# Patient Record
Sex: Female | Born: 1945 | Race: White | Hispanic: No
Health system: Midwestern US, Community
[De-identification: ages and names within clinical notes are randomized; demographics above are authoritative.]

## PROBLEM LIST (undated history)

## (undated) DIAGNOSIS — K6289 Other specified diseases of anus and rectum: Secondary | ICD-10-CM

## (undated) DIAGNOSIS — R933 Abnormal findings on diagnostic imaging of other parts of digestive tract: Secondary | ICD-10-CM

## (undated) DIAGNOSIS — R918 Other nonspecific abnormal finding of lung field: Secondary | ICD-10-CM

## (undated) DIAGNOSIS — F411 Generalized anxiety disorder: Secondary | ICD-10-CM

## (undated) DIAGNOSIS — R945 Abnormal results of liver function studies: Secondary | ICD-10-CM

## (undated) DIAGNOSIS — M542 Cervicalgia: Secondary | ICD-10-CM

## (undated) DIAGNOSIS — C3492 Malignant neoplasm of unspecified part of left bronchus or lung: Secondary | ICD-10-CM

## (undated) DIAGNOSIS — M25551 Pain in right hip: Secondary | ICD-10-CM

## (undated) DIAGNOSIS — D120 Benign neoplasm of cecum: Secondary | ICD-10-CM

## (undated) DIAGNOSIS — J984 Other disorders of lung: Secondary | ICD-10-CM

## (undated) DIAGNOSIS — R042 Hemoptysis: Secondary | ICD-10-CM

## (undated) DIAGNOSIS — U071 COVID-19: Secondary | ICD-10-CM

## (undated) DIAGNOSIS — Z780 Asymptomatic menopausal state: Secondary | ICD-10-CM

## (undated) DIAGNOSIS — C3412 Malignant neoplasm of upper lobe, left bronchus or lung: Secondary | ICD-10-CM

---

## 2014-06-20 NOTE — Progress Notes (Signed)
Chief complaint:  Left upper lobe lung mass.    History:  The patient is a 68 year old woman who was a former cigarette smoker.  She has about a 34-pack-year history of smoking cigarettes, having quit 15 years ago.  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  This was followed with CT scans in [place] over the next year.   She had 10, 12, 13 and 14.  She had a fall where she broke seven ribs in her left chest and the CT scan for the fall at a new facility showed her to have this cavitary lung mass.  She was referred here for evaluation.  She has been kind of around the block trying to figure out what to do about this and she was referred here for our evaluation.      Past medical history:  Significant for hypertension for which she takes lisinopril.  She has had rotator cuff surgery.  She has had cholecystectomy.  She has about an ounce of alcohol a day with a cocktail.  She lives in [place], [place], having retired from the [employer] with her husband.  They are both from [place], near the [place] area.    Review of systems:  Complete and is otherwise unremarkable.    Family history:  Significant for a brother who has osteoarthritis and heart disease and a mother who had hypertension and a stroke and her father had a stroke.      Physical examination:  The patient is a well-developed, well-nourished woman in no acute distress.  HEENT EXAMINATION:  Trachea is in the midline.  No supraclavicular adenopathy, jugular distention or carotid bruit.  LUNGS:  Clear to percussion and auscultation.  HEART:  Regular sinus rhythm without murmurs or extra sounds.    ABDOMEN:  Soft and nontender without mass or organomegaly.  EXTREMITIES:  Show no clubbing, cyanosis or edema.       X-rays:  X-rays from 2011, 2012 and CAT scans from 11, 12, 14 and 15 are all reviewed.  This lesion is cavitary, solitary and it does not appear to have changed in the 4 years it has been followed yearly.  PET scan shows activity in the fractured ribs but does not show an SUV of 3 in this lesion.      We had a discussion about the fact that cancer is a primary concern and she and her husband are very educated and are clear about bronchial alveolar cell carcinoma versus adenocarcinoma in-situ versus solitary cavitary lesion.  I told them that I would present her to the tumor board but my inclination was that this be followed on a yearly basis and not be resected   unless it changed.  I am committed to calling them back as soon as the tumor board meets.

## 2014-06-20 NOTE — Progress Notes (Signed)
Patient says in the beginning of June she fell and broke her ribs and had test done and they said it was something on her left lung. She had a PET scan done and her PCP and she was seen [facility]) and her PCP just told her to come to [place]. She said she has been seeing Drs since 2011 in [place] for this problem and they all told her that she was fine and its nothing to worry about. She denies chest pains, coughing, wheezing, sob, weight loss, fevers or chills.

## 2014-08-09 NOTE — Progress Notes (Signed)
New CT shows further consolidaton of the lesion  Tumor board has been consulted and the recommendation is for resection  I have communicated this to Ms Andrea [name]   We will schedule surgery for later this month

## 2014-08-10 NOTE — Other (Signed)
[facility]  PREOPERATIVE INSTRUCTIONS    Surgery Date:   08/16/2014  Surgery arrival time given by surgeon: NO   If ???no???,[facility] staff will call you between 4 PM- 8 PM the day before surgery with your arrival time. If your surgery is on a Monday, we will call you the preceding Friday.       Please call [phone] after 8 PM if you did not receive your arrival time.    1. Please report at the designated time to the [facility].  Bring your insurance card, photo identification, and any copayment ( if applicable).  2. You must have a responsible adult to drive you home. You need to have a responsible adult to stay with you the first 24 hours after surgery if you are going home the same day of your surgery and you should not drive a car for 24 hours following your surgery.  3. Nothing to eat or drink after midnight the night before surgery. This includes no water, gum, mints, coffee, juice, etc.  Please note special instructions, if applicable, below for medications.  4. MEDICATIONS TO TAKE THE MORNING OF SURGERY WITH A SIP OF WATER: none  5. No alcoholic beverages 24 hours before or after your surgery.  6. If you are being admitted to the hospital,please leave personal belongings/luggage in your car until you have an assigned hospital room number.  7. Stop Aspirin and/or any non-steroidal anti-inflammatory drugs (i.e. Ibuprofen, Naproxen, Advil, Aleve) as directed by your surgeon.  You may take Tylenol.        Stop herbal supplements 1 week prior to  surgery.  8. If you are currently taking Plavix, Coumadin,or any other blood-thinning/anticoagulant medication contact your surgeon for instructions.  9. Please wear comfortable clothes. Wear your glasses instead of contacts. We ask that all money, jewelry and valuables be left at home. Wear no make up, particularly mascara, the day of surgery.   10.  All body piercings, rings,and jewelry need to be removed and left at  home.    Please wear your hair loose or down. Please no pony-tails, buns, or any metal hair accessories. If you shower the morning of surgery, please do not apply any lotions, powders, or deodorants afterwards.   Do not shave any body area within 24 hours of your surgery.  11. Please follow all instructions to avoid any potential surgical cancellation.  12.  Should your physical condition change, (i.e. fever, cold, flu, etc.) please notify your surgeon as soon as possible.  13. It is important to be on time. If a situation occurs where you may be delayed, please call:  [phone] / [phone] on the day of surgery.  14. The Preadmission Testing staff can be reached at [phone]..  15. Special instructions: none    The patient was contacted  via phone.   She  verbalize  understanding of all instructions does not  need reinforcement.

## 2014-08-11 NOTE — Progress Notes (Signed)
Patient scheduled to have surgery on Tuesday, Aug 16, 2014 at [facility]. Auth number [id number] choice [phone]

## 2014-08-18 NOTE — Telephone Encounter (Signed)
Called patient to reschedule surgery that was scheduled for 08/17/2014. Patient called because she wasn't feeling well. Called patient this morning and she says she will have to look at her schedule and get back to me with a date but says she is thinking that it wont be until January when she will be able to do the surgery.

## 2015-09-11 NOTE — Telephone Encounter (Signed)
Pt called about her medical treament plan which was discussed with Dr. Ria Comment over a year ago.  She now wants her imaging on a disk.  I informed her we only have one disk and that she needed to contact the other locations in which she had her scans for a copy.  The disk we have cannot be mailed due to HIPAA regulations.  I did inform her that she could come and pick the disk up; she felt that I was "punishing her for walking the disk into our office."  I let her know that this was not the case and that I had to follow the regulations.  She ended her conversation with asking me to email the number to [facility] radiology so she could contact them.  I provided the information from the front of the disk in an email (detailed below) sent to [email or web address]    Andrea [name],    From the disk we have the images were done at [facility] at [address].  Their number is [phone].    If you need any other assistance please give our office a call.    Thanks,

## 2017-06-27 ENCOUNTER — Encounter

## 2017-06-27 ENCOUNTER — Inpatient Hospital Stay: Admit: 2017-06-27 | Payer: MEDICARE

## 2017-06-27 DIAGNOSIS — M25512 Pain in left shoulder: Secondary | ICD-10-CM

## 2018-01-16 ENCOUNTER — Encounter

## 2018-01-22 ENCOUNTER — Inpatient Hospital Stay: Admit: 2018-01-22 | Payer: MEDICARE | Attending: Internal Medicine

## 2018-01-22 DIAGNOSIS — Z13820 Encounter for screening for osteoporosis: Secondary | ICD-10-CM

## 2018-03-02 ENCOUNTER — Inpatient Hospital Stay
Admit: 2018-03-02 | Discharge: 2018-03-02 | Disposition: A | Discharge status: Home / Self Care | Payer: MEDICARE | Attending: Emergency Medicine

## 2018-03-02 ENCOUNTER — Emergency Department: Admit: 2018-03-02 | Payer: MEDICARE

## 2018-03-02 DIAGNOSIS — R509 Fever, unspecified: Secondary | ICD-10-CM

## 2018-03-02 LAB — COMPREHENSIVE METABOLIC PANEL
ALT: 112 U/L — ABNORMAL HIGH (ref 12–78)
AST: 157 U/L — ABNORMAL HIGH (ref 15–37)
Albumin/Globulin Ratio: 1 — ABNORMAL LOW (ref 1.1–2.2)
Albumin: 3 g/dL — ABNORMAL LOW (ref 3.5–5.0)
Alkaline Phosphatase: 141 U/L — ABNORMAL HIGH (ref 45–117)
Anion Gap: 10 mmol/L (ref 5–15)
BUN: 12 MG/DL (ref 6–20)
Bun/Cre Ratio: 19 (ref 12–20)
CO2: 25 mmol/L (ref 21–32)
Calcium: 8 MG/DL — ABNORMAL LOW (ref 8.5–10.1)
Chloride: 102 mmol/L (ref 97–108)
Creatinine: 0.64 MG/DL (ref 0.55–1.02)
EGFR IF NonAfrican American: >60 mL/min/{1.73_m2} (ref >60)
GFR African American: >60 mL/min/{1.73_m2} (ref >60)
Globulin: 3 g/dL (ref 2.0–4.0)
Glucose: 101 mg/dL — ABNORMAL HIGH (ref 65–100)
Potassium: 3.1 mmol/L — ABNORMAL LOW (ref 3.5–5.1)
Sodium: 137 mmol/L (ref 136–145)
Total Bilirubin: 0.7 MG/DL (ref 0.2–1.0)
Total Protein: 6 g/dL — ABNORMAL LOW (ref 6.4–8.2)

## 2018-03-02 LAB — URINALYSIS W/ RFLX MICROSCOPIC
Bilirubin, Urine: NEGATIVE
Bilirubin: NEGATIVE
Blood, Urine: NEGATIVE
Blood: NEGATIVE
Glucose, Ur: NEGATIVE mg/dL
Glucose: NEGATIVE mg/dL
Ketone: 80 mg/dL — AB
Ketones, Urine: 80 mg/dL — AB
Leukocyte Esterase, Urine: NEGATIVE
Leukocyte Esterase: NEGATIVE
Nitrite, Urine: NEGATIVE
Nitrites: NEGATIVE
Specific Gravity, UA: 1.02 (ref 1.003–1.030)
Specific gravity: 1.02 (ref 1.003–1.030)
Urobilinogen, UA, POCT: 0.2 EU/dL (ref 0.2–1.0)
Urobilinogen: 0.2 EU/dL (ref 0.2–1.0)
pH (UA): 5.5 (ref 5.0–8.0)
pH, UA: 5.5 (ref 5.0–8.0)

## 2018-03-02 LAB — CBC WITH AUTO DIFFERENTIAL
Basophils %: 0 % (ref 0–1)
Basophils Absolute: 0 10*3/uL (ref 0.0–0.1)
Eosinophils %: 0 % (ref 0–7)
Eosinophils Absolute: 0 10*3/uL (ref 0.0–0.4)
Granulocyte Absolute Count: 0 10*3/uL (ref 0.00–0.04)
Hematocrit: 34.4 % — ABNORMAL LOW (ref 35.0–47.0)
Hemoglobin: 11.8 g/dL (ref 11.5–16.0)
Immature Granulocytes: 0 % (ref 0.0–0.5)
Lymphocytes %: 1 % — ABNORMAL LOW (ref 12–49)
Lymphocytes Absolute: 0 10*3/uL — ABNORMAL LOW (ref 0.8–3.5)
MCH: 33.1 PG (ref 26.0–34.0)
MCHC: 34.3 g/dL (ref 30.0–36.5)
MCV: 96.4 FL (ref 80.0–99.0)
MPV: 10.5 FL (ref 8.9–12.9)
Monocytes %: 4 % — ABNORMAL LOW (ref 5–13)
Monocytes Absolute: 0.2 10*3/uL (ref 0.0–1.0)
NRBC Absolute: 0 10*3/uL (ref 0.00–0.01)
Neutrophils %: 95 % — ABNORMAL HIGH (ref 32–75)
Neutrophils Absolute: 4.3 10*3/uL (ref 1.8–8.0)
Nucleated RBCs: 0 PER 100 WBC
Platelets: 127 10*3/uL — ABNORMAL LOW (ref 150–400)
RBC: 3.57 M/uL — ABNORMAL LOW (ref 3.80–5.20)
RDW: 11.9 % (ref 11.5–14.5)
WBC: 4.5 10*3/uL (ref 3.6–11.0)

## 2018-03-02 LAB — URINE MICROSCOPIC ONLY
BACTERIA, URINE: NEGATIVE /hpf
Bacteria: NEGATIVE /hpf

## 2018-03-02 LAB — LACTIC ACID
Lactic Acid: 1.1 MMOL/L (ref 0.4–2.0)
Lactic acid: 1.1 MMOL/L (ref 0.4–2.0)

## 2018-03-02 LAB — TROPONIN: Troponin I: <0.05 ng/mL (ref <0.05)

## 2018-03-02 LAB — CBC WITH AUTOMATED DIFF
ABS. BASOPHILS: 0 10*3/uL (ref 0.0–0.1)
ABS. EOSINOPHILS: 0 10*3/uL (ref 0.0–0.4)
ABS. IMM. GRANS.: 0 10*3/uL (ref 0.00–0.04)
ABS. LYMPHOCYTES: 0 10*3/uL — ABNORMAL LOW (ref 0.8–3.5)
ABS. MONOCYTES: 0.2 10*3/uL (ref 0.0–1.0)
ABS. NEUTROPHILS: 4.3 10*3/uL (ref 1.8–8.0)
ABSOLUTE NRBC: 0 10*3/uL (ref 0.00–0.01)
BASOPHILS: 0 % (ref 0–1)
EOSINOPHILS: 0 % (ref 0–7)
HCT: 34.4 % — ABNORMAL LOW (ref 35.0–47.0)
HGB: 11.8 g/dL (ref 11.5–16.0)
IMMATURE GRANULOCYTES: 0 % (ref 0.0–0.5)
LYMPHOCYTES: 1 % — ABNORMAL LOW (ref 12–49)
MCH: 33.1 PG (ref 26.0–34.0)
MCHC: 34.3 g/dL (ref 30.0–36.5)
MCV: 96.4 FL (ref 80.0–99.0)
MONOCYTES: 4 % — ABNORMAL LOW (ref 5–13)
MPV: 10.5 FL (ref 8.9–12.9)
NEUTROPHILS: 95 % — ABNORMAL HIGH (ref 32–75)
NRBC: 0 PER 100 WBC
PLATELET: 127 10*3/uL — ABNORMAL LOW (ref 150–400)
RBC: 3.57 M/uL — ABNORMAL LOW (ref 3.80–5.20)
RDW: 11.9 % (ref 11.5–14.5)
WBC: 4.5 10*3/uL (ref 3.6–11.0)

## 2018-03-02 LAB — METABOLIC PANEL, COMPREHENSIVE
A-G Ratio: 1 — ABNORMAL LOW (ref 1.1–2.2)
ALT (SGPT): 112 U/L — ABNORMAL HIGH (ref 12–78)
AST (SGOT): 157 U/L — ABNORMAL HIGH (ref 15–37)
Albumin: 3 g/dL — ABNORMAL LOW (ref 3.5–5.0)
Alk. phosphatase: 141 U/L — ABNORMAL HIGH (ref 45–117)
Anion gap: 10 mmol/L (ref 5–15)
BUN/Creatinine ratio: 19 (ref 12–20)
BUN: 12 MG/DL (ref 6–20)
Bilirubin, total: 0.7 MG/DL (ref 0.2–1.0)
CO2: 25 mmol/L (ref 21–32)
Calcium: 8 MG/DL — ABNORMAL LOW (ref 8.5–10.1)
Chloride: 102 mmol/L (ref 97–108)
Creatinine: 0.64 MG/DL (ref 0.55–1.02)
GFR est AA: >60 mL/min/{1.73_m2} (ref >60)
GFR est non-AA: >60 mL/min/{1.73_m2} (ref >60)
Globulin: 3 g/dL (ref 2.0–4.0)
Glucose: 101 mg/dL — ABNORMAL HIGH (ref 65–100)
Potassium: 3.1 mmol/L — ABNORMAL LOW (ref 3.5–5.1)
Protein, total: 6 g/dL — ABNORMAL LOW (ref 6.4–8.2)
Sodium: 137 mmol/L (ref 136–145)

## 2018-03-02 LAB — URINE CULTURE HOLD SAMPLE

## 2018-03-02 LAB — SAMPLES BEING HELD

## 2018-03-02 LAB — TROPONIN I: Troponin-I, Qt.: <0.05 ng/mL (ref <0.05)

## 2018-03-02 MED ORDER — LACTATED RINGERS BOLUS IV
Freq: Once | INTRAVENOUS | Status: AC
Start: 2018-03-02 — End: 2018-03-02
  Administered 2018-03-02: 22:00:00 via INTRAVENOUS

## 2018-03-02 MED ORDER — SODIUM CHLORIDE 0.9 % IJ SYRG
INTRAMUSCULAR | Status: DC | PRN
Start: 2018-03-02 — End: 2018-03-02

## 2018-03-02 MED ORDER — ONDANSETRON (PF) 4 MG/2 ML INJECTION
4 mg/2 mL | Freq: Once | INTRAMUSCULAR | Status: AC
Start: 2018-03-02 — End: 2018-03-02
  Administered 2018-03-02: 21:00:00 via INTRAVENOUS

## 2018-03-02 MED ORDER — LACTATED RINGERS BOLUS IV
Freq: Once | INTRAVENOUS | Status: AC
Start: 2018-03-02 — End: 2018-03-02
  Administered 2018-03-02: 20:00:00 via INTRAVENOUS

## 2018-03-02 MED ORDER — KETOROLAC TROMETHAMINE 30 MG/ML INJECTION
30 mg/mL (1 mL) | INTRAMUSCULAR | Status: AC
Start: 2018-03-02 — End: 2018-03-02
  Administered 2018-03-02: 21:00:00 via INTRAVENOUS

## 2018-03-02 MED ORDER — ACETAMINOPHEN 325 MG TABLET
325 mg | ORAL | Status: AC
Start: 2018-03-02 — End: 2018-03-02
  Administered 2018-03-02: 21:00:00 via ORAL

## 2018-03-02 MED ORDER — POTASSIUM CHLORIDE SR 10 MEQ TAB
10 mEq | ORAL | Status: AC
Start: 2018-03-02 — End: 2018-03-02
  Administered 2018-03-02: 23:00:00 via ORAL

## 2018-03-02 MED FILL — LACTATED RINGERS IV: INTRAVENOUS | Qty: 1000

## 2018-03-02 MED FILL — ONDANSETRON (PF) 4 MG/2 ML INJECTION: 4 mg/2 mL | INTRAMUSCULAR | Qty: 2

## 2018-03-02 MED FILL — KETOROLAC TROMETHAMINE 30 MG/ML INJECTION: 30 mg/mL (1 mL) | INTRAMUSCULAR | Qty: 1

## 2018-03-02 MED FILL — ACETAMINOPHEN 325 MG TABLET: 325 mg | ORAL | Qty: 3

## 2018-03-02 MED FILL — POTASSIUM CHLORIDE SR 10 MEQ TAB: 10 mEq | ORAL | Qty: 4

## 2018-03-02 MED FILL — NORMAL SALINE FLUSH 0.9 % INJECTION SYRINGE: INTRAMUSCULAR | Qty: 10

## 2018-03-02 NOTE — ED Notes (Signed)
Bedside shift change report given to Andrea [name] (oncoming nurse) by Merry Lofty, RN (offgoing nurse). Report included the following information SBAR, ED Summary, Recent Results and Cardiac Rhythm NSR inverted T waves.

## 2018-03-02 NOTE — ED Provider Notes (Signed)
EMERGENCY DEPARTMENT HISTORY AND PHYSICAL EXAM    3:39 PM      Date: 03/02/2018  Patient Name: Andrea [name]    History of Presenting Illness     Chief Complaint   Patient presents with   ??? Fever         History Provided By: Patient      Additional History (Context): Andrea [name] is a 72 y.o. female with hypertension who presents with multiple complaints.  Patient is feeling poorly all over, "I feel like shit".  She is extremely cold, she has right back pain that extends down to behind her right knee, made worse with movement of her right leg, she feels extremely thirsty, she is very tired.  Of note, she does have some neck stiffness, however she has had this for quite some time, as documented on prior notes.  No one else with similar symptoms.  She has had nausea, no vomiting.  Denies shortness of breath, chest pain, belly pain, denies any urinary complaints    PCP: Daffeh, June B, MD    Current Outpatient Medications   Medication Sig Dispense Refill   ??? amoxicillin (AMOXIL) 500 mg capsule Take 500 mg by mouth three (3) times daily.     ??? hydrALAZINE (APRESOLINE) 50 mg tablet Take 50 mg by mouth two (2) times a day.     ??? escitalopram oxalate (LEXAPRO) 5 mg tablet Take 10 mg by mouth daily.     ??? METHYLCELLULOSE Take 500 mg by mouth nightly.     ??? atenolol (TENORMIN) 25 mg tablet Take 25 mg by mouth daily.     ??? NIACIN, INOSITOL NIACINATE, (NIACIN NO FLUSH PO) Take 1 Tab by mouth nightly.     ??? lisinopril (PRINIVIL, ZESTRIL) 5 mg tablet Take 40 mg by mouth daily.         Past History     Past Medical History:  Past Medical History:   Diagnosis Date   ??? Essential hypertension    ??? Gallstone    ??? GERD (gastroesophageal reflux disease)        Past Surgical History:  Past Surgical History:   Procedure Laterality Date   ??? HX CHOLECYSTECTOMY  06/14/2014   ??? HX MOHS PROCEDURES  2011   ??? HX TONSILLECTOMY  1962       Family History:  Family History   Problem Relation Age of Onset   ??? Stroke Mother    ???  Hypertension Mother    ??? Stroke Father    ??? Arthritis-osteo Brother    ??? Cancer Brother    ??? Heart Disease Brother    ??? Kidney Disease Brother        Social History:  Social History     Tobacco Use   ??? Smoking status: Former Smoker     Packs/day: 1.00     Years: 34.00     Pack years: 34.00   Substance Use Topics   ??? Alcohol use: Yes     Alcohol/week: 4.2 oz     Types: 7 Shots of liquor per week   ??? Drug use: Not on file       Allergies:  Allergies   Allergen Reactions   ??? Norco [Hydrocodone-Acetaminophen] Other (comments)     "loopy feeling"   ??? Percocet [Oxycodone-Acetaminophen] Other (comments)     "feeling loopy"         Review of Systems       Review of Systems  Constitutional: Positive for chills and fatigue. Negative for activity change and fever.   HENT: Negative.  Negative for ear pain, hearing loss and sore throat.    Eyes: Negative.  Negative for pain and visual disturbance.   Respiratory: Negative.  Negative for cough, chest tightness and shortness of breath.    Cardiovascular: Negative.  Negative for chest pain and leg swelling.   Gastrointestinal: Negative.  Negative for abdominal distention and abdominal pain.   Genitourinary: Negative.  Negative for dysuria and hematuria.   Musculoskeletal: Positive for back pain.   Skin: Negative.  Negative for rash.   Neurological: Negative.  Negative for dizziness and headaches.   Psychiatric/Behavioral: Negative.  Negative for agitation and behavioral problems.         Physical Exam     Visit Vitals  BP (!) 98/35   Pulse 63   Temp 99.1 ??F (37.3 ??C)   Resp 21   Ht 5\' 2"  (1.575 m)   Wt 74.8 kg (165 lb)   SpO2 93%   BMI 30.18 kg/m??         Physical Exam   Constitutional: She is oriented to person, place, and time. She appears well-developed and well-nourished.   HENT:   Head: Normocephalic and atraumatic.   Very dry mucous membranes   Eyes: Pupils are equal, round, and reactive to light. EOM are normal. Right eye exhibits no discharge. Left eye exhibits no  discharge.   Neck: Normal range of motion. Neck supple. No JVD present. No tracheal deviation present.   Cardiovascular: Normal rate, regular rhythm and normal heart sounds.   No murmur heard.  Pulmonary/Chest: Effort normal and breath sounds normal. No respiratory distress. She has no wheezes. She has no rales.   Abdominal: Soft. Bowel sounds are normal. She exhibits no distension. There is no tenderness. There is no rebound.   Musculoskeletal: Normal range of motion. She exhibits no tenderness or deformity.   Positive straight leg raise test on the right, tenderness to palpation along the right paraspinal, able to exacerbate symptoms by pushing on the piriformis outlet   Neurological: She is alert and oriented to person, place, and time. No cranial nerve deficit.   5/5 strength UE/LE, 5/5 sensation UE/LE  Negative Kernig, negative Brudzinski   Skin: Skin is warm and dry. No rash noted. No erythema.   Psychiatric: She has a normal mood and affect. Her behavior is normal.         Diagnostic Study Results     Labs -  No results found for this or any previous visit (from the past 12 hour(s)).    Radiologic Studies -   XR CHEST SNGL V   Final Result   IMPRESSION: No acute cardiopulmonary disease.               Medical Decision Making   I am the first provider for this patient.    I reviewed the vital signs, available nursing notes, past medical history, past surgical history, family history and social history.    Vital Signs-Reviewed the patient's vital signs.      UVO:[id number] Sinus Rhythm   Interpreted by the EP.   Time Interpreted: 0347   Rate: 85   Rhythm: NSR w/pac   Interpretation:RBBB no ischemia    Records Reviewed: Nursing Notes and Old Medical Records      Provider Notes (Medical Decision Making):     MDM  Number of Diagnoses or Management Options  Fever, unspecified fever cause:  Diagnosis management comments: Differential Diagnosis: Viral syndrome, pneumonia, UTI, meningitis    Patient presenting with fever,  family sent her because she was not responding at the phone but this is because she did not have service she states, she denies chest pain shortness of breath abdominal pain, she has his back pain but she has a positive straight leg raise test on the right, it is paraspinal, not midline, no concern for discitis or spinal epidural abscess. No concern for meningitis at this time, she is lucid, no meningismus, she has prior neck pain.  Will check basic labs for any leukocytosis or other concerning findings, rehydrate, reassess after treating nausea and fever    Reevaluation: workup negative, pt feeling better, likely viral syndrome, no leukocytosis, left shift, counseled on return precautions such as intolerancer of PO. BP baseline.safe for d/c    Safe for d/c, careful return precautions given. Pt/family comfortable with plan    Winifred Olive, MD            Diagnosis     Clinical Impression:   1. Fever, unspecified fever cause        Disposition: home    Follow-up Information     Follow up With Specialties Details Why Contact Info    [facility]  As needed, If symptoms worsen [facility]    Daffeh, June B, MD Internal Medicine In 1 day  [address]  [place] [address]  [phone]             Discharge Medication List as of 03/02/2018  7:08 PM      CONTINUE these medications which have NOT CHANGED    Details   escitalopram oxalate (LEXAPRO) 5 mg tablet Take 5 mg by mouth nightly., Historical Med      METHYLCELLULOSE Take 500 mg by mouth nightly., Historical Med      atenolol (TENORMIN) 25 mg tablet Take 25 mg by mouth nightly., Historical Med      NIACIN, INOSITOL NIACINATE, (NIACIN NO FLUSH PO) Take 1 Tab by mouth nightly., Historical Med      lisinopril (PRINIVIL, ZESTRIL) 5 mg tablet Take  by mouth nightly., Historical Med      hydrochlorothiazide (HYDRODIURIL) 25 mg tablet Take 25 mg by mouth nightly., Historical Med            _______________________________    Please note that this dictation was completed with Dragon, the computer voice recognition software.  Quite often unanticipated grammatical, syntax, homophones, and other interpretive errors are inadvertently transcribed by the computer software.  Please disregard these errors.  Please excuse any errors that have escaped final proofreading.

## 2018-03-02 NOTE — ED Notes (Signed)
Bp's have been running low. Provider aware. Daughter stated she was started on a new BP medication. Patient comfortable and sleeping.

## 2018-03-02 NOTE — ED Notes (Signed)
Discharge instructions reviewed w/pt and her dtr... Pt verbalized understanding, all questions answered..the patient ambulated off the ED unit w/o difficulty.

## 2018-03-02 NOTE — ED Notes (Signed)
Patient instructed on how to obtain a Clean Catch [place] urine. Verbalized understanding.

## 2018-03-02 NOTE — ED Notes (Signed)
Patient feeling much better, talkative and thirsty.

## 2018-03-02 NOTE — ED Notes (Addendum)
Bedside shift change report given to Andrea [name] (oncoming nurse) by Laverda Sorenson, RN (offgoing nurse). Report included the following information SBAR, ED Summary, Recent Results and Cardiac Rhythm NSR inverted T waves.

## 2018-03-02 NOTE — ED Notes (Signed)
Patient instructed on how to obtain a Clean Catch [place] urine. Verbalized understanding.

## 2018-03-02 NOTE — ED Provider Notes (Signed)
EMERGENCY DEPARTMENT HISTORY AND PHYSICAL EXAM    3:39 PM      Date: 03/02/2018  Patient Name: Andrea [name]    History of Presenting Illness     Chief Complaint   Patient presents with   ??? Fever         History Provided By: Patient      Additional History (Context): Andrea [name] is a 72 y.o. female with hypertension who presents with multiple complaints.  Patient is feeling poorly all over, "I feel like shit".  She is extremely cold, she has right back pain that extends down to behind her right knee, made worse with movement of her right leg, she feels extremely thirsty, she is very tired.  Of note, she does have some neck stiffness, however she has had this for quite some time, as documented on prior notes.  No one else with similar symptoms.  She has had nausea, no vomiting.  Denies shortness of breath, chest pain, belly pain, denies any urinary complaints    PCP: Daffeh, June B, MD    Current Outpatient Medications   Medication Sig Dispense Refill   ??? amoxicillin (AMOXIL) 500 mg capsule Take 500 mg by mouth three (3) times daily.     ??? hydrALAZINE (APRESOLINE) 50 mg tablet Take 50 mg by mouth two (2) times a day.     ??? escitalopram oxalate (LEXAPRO) 5 mg tablet Take 10 mg by mouth daily.     ??? METHYLCELLULOSE Take 500 mg by mouth nightly.     ??? atenolol (TENORMIN) 25 mg tablet Take 25 mg by mouth daily.     ??? NIACIN, INOSITOL NIACINATE, (NIACIN NO FLUSH PO) Take 1 Tab by mouth nightly.     ??? lisinopril (PRINIVIL, ZESTRIL) 5 mg tablet Take 40 mg by mouth daily.         Past History     Past Medical History:  Past Medical History:   Diagnosis Date   ??? Essential hypertension    ??? Gallstone    ??? GERD (gastroesophageal reflux disease)        Past Surgical History:  Past Surgical History:   Procedure Laterality Date   ??? HX CHOLECYSTECTOMY  06/14/2014   ??? HX MOHS PROCEDURES  2011   ??? HX TONSILLECTOMY  1962       Family History:  Family History   Problem Relation Age of Onset   ??? Stroke Mother     ??? Hypertension Mother    ??? Stroke Father    ??? Arthritis-osteo Brother    ??? Cancer Brother    ??? Heart Disease Brother    ??? Kidney Disease Brother        Social History:  Social History     Tobacco Use   ??? Smoking status: Former Smoker     Packs/day: 1.00     Years: 34.00     Pack years: 34.00   Substance Use Topics   ??? Alcohol use: Yes     Alcohol/week: 4.2 oz     Types: 7 Shots of liquor per week   ??? Drug use: Not on file       Allergies:  Allergies   Allergen Reactions   ??? Norco [Hydrocodone-Acetaminophen] Other (comments)     "loopy feeling"   ??? Percocet [Oxycodone-Acetaminophen] Other (comments)     "feeling loopy"         Review of Systems       Review of Systems  Constitutional: Positive for chills and fatigue. Negative for activity change and fever.   HENT: Negative.  Negative for ear pain, hearing loss and sore throat.    Eyes: Negative.  Negative for pain and visual disturbance.   Respiratory: Negative.  Negative for cough, chest tightness and shortness of breath.    Cardiovascular: Negative.  Negative for chest pain and leg swelling.   Gastrointestinal: Negative.  Negative for abdominal distention and abdominal pain.   Genitourinary: Negative.  Negative for dysuria and hematuria.   Musculoskeletal: Positive for back pain.   Skin: Negative.  Negative for rash.   Neurological: Negative.  Negative for dizziness and headaches.   Psychiatric/Behavioral: Negative.  Negative for agitation and behavioral problems.         Physical Exam     Visit Vitals  BP (!) 98/35   Pulse 63   Temp 99.1 ??F (37.3 ??C)   Resp 21   Ht 5\' 2"  (1.575 m)   Wt 74.8 kg (165 lb)   SpO2 93%   BMI 30.18 kg/m??         Physical Exam   Constitutional: She is oriented to person, place, and time. She appears well-developed and well-nourished.   HENT:   Head: Normocephalic and atraumatic.   Very dry mucous membranes   Eyes: Pupils are equal, round, and reactive to light. EOM are normal.  Right eye exhibits no discharge. Left eye exhibits no discharge.   Neck: Normal range of motion. Neck supple. No JVD present. No tracheal deviation present.   Cardiovascular: Normal rate, regular rhythm and normal heart sounds.   No murmur heard.  Pulmonary/Chest: Effort normal and breath sounds normal. No respiratory distress. She has no wheezes. She has no rales.   Abdominal: Soft. Bowel sounds are normal. She exhibits no distension. There is no tenderness. There is no rebound.   Musculoskeletal: Normal range of motion. She exhibits no tenderness or deformity.   Positive straight leg raise test on the right, tenderness to palpation along the right paraspinal, able to exacerbate symptoms by pushing on the piriformis outlet   Neurological: She is alert and oriented to person, place, and time. No cranial nerve deficit.   5/5 strength UE/LE, 5/5 sensation UE/LE  Negative Kernig, negative Brudzinski   Skin: Skin is warm and dry. No rash noted. No erythema.   Psychiatric: She has a normal mood and affect. Her behavior is normal.         Diagnostic Study Results     Labs -  No results found for this or any previous visit (from the past 12 hour(s)).    Radiologic Studies -   XR CHEST SNGL V   Final Result   IMPRESSION: No acute cardiopulmonary disease.               Medical Decision Making   I am the first provider for this patient.    I reviewed the vital signs, available nursing notes, past medical history, past surgical history, family history and social history.    Vital Signs-Reviewed the patient's vital signs.      ZOX:WRUEAV Sinus Rhythm   Interpreted by the EP.   Time Interpreted: 4098   Rate: 85   Rhythm: NSR w/pac   Interpretation:RBBB no ischemia    Records Reviewed: Nursing Notes and Old Medical Records      Provider Notes (Medical Decision Making):     MDM  Number of Diagnoses or Management Options  Fever, unspecified fever cause:  Diagnosis management comments: Differential Diagnosis: Viral syndrome,  pneumonia, UTI, meningitis    Patient presenting with fever, family sent her because she was not responding at the phone but this is because she did not have service she states, she denies chest pain shortness of breath abdominal pain, she has his back pain but she has a positive straight leg raise test on the right, it is paraspinal, not midline, no concern for discitis or spinal epidural abscess. No concern for meningitis at this time, she is lucid, no meningismus, she has prior neck pain.  Will check basic labs for any leukocytosis or other concerning findings, rehydrate, reassess after treating nausea and fever    Reevaluation: workup negative, pt feeling better, likely viral syndrome, no leukocytosis, left shift, counseled on return precautions such as intolerancer of PO. BP baseline.safe for d/c    Safe for d/c, careful return precautions given. Pt/family comfortable with plan    Winifred Olive, MD            Diagnosis     Clinical Impression:   1. Fever, unspecified fever cause      Disposition: home    Follow-up Information     Follow up With Specialties Details Why Contact Info    [facility]  As needed, If symptoms worsen [facility]    Daffeh, June B, MD Internal Medicine In 1 day  [address]  [place] [address]  [phone]             Discharge Medication List as of 03/02/2018  7:08 PM      CONTINUE these medications which have NOT CHANGED    Details   escitalopram oxalate (LEXAPRO) 5 mg tablet Take 5 mg by mouth nightly., Historical Med      METHYLCELLULOSE Take 500 mg by mouth nightly., Historical Med      atenolol (TENORMIN) 25 mg tablet Take 25 mg by mouth nightly., Historical Med      NIACIN, INOSITOL NIACINATE, (NIACIN NO FLUSH PO) Take 1 Tab by mouth nightly., Historical Med      lisinopril (PRINIVIL, ZESTRIL) 5 mg tablet Take  by mouth nightly., Historical Med      hydrochlorothiazide (HYDRODIURIL) 25 mg tablet Take 25 mg by mouth  nightly., Historical Med           _______________________________    Please note that this dictation was completed with Dragon, the computer voice recognition software.  Quite often unanticipated grammatical, syntax, homophones, and other interpretive errors are inadvertently transcribed by the computer software.  Please disregard these errors.  Please excuse any errors that have escaped final proofreading.

## 2018-03-03 LAB — EKG 12-LEAD
Atrial Rate: 85 {beats}/min
P Axis: 70 degrees
P-R Interval: 150 ms
Q-T Interval: 396 ms
QRS Duration: 126 ms
QTc Calculation (Bazett): 471 ms
R Axis: 22 degrees
T Axis: 36 degrees
Ventricular Rate: 85 {beats}/min

## 2018-03-03 LAB — EKG, 12 LEAD, INITIAL
Atrial Rate: 85 {beats}/min
Calculated P Axis: 70 degrees
Calculated R Axis: 22 degrees
Calculated T Axis: 36 degrees
P-R Interval: 150 ms
Q-T Interval: 396 ms
QRS Duration: 126 ms
QTC Calculation (Bezet): 471 ms
Ventricular Rate: 85 {beats}/min

## 2018-03-08 LAB — CULTURE, BLOOD 1
Culture: NO GROWTH
Culture: NO GROWTH

## 2018-03-08 LAB — CULTURE, BLOOD
Culture result:: NO GROWTH
Culture result:: NO GROWTH

## 2018-03-12 ENCOUNTER — Encounter

## 2018-03-13 ENCOUNTER — Inpatient Hospital Stay: Admit: 2018-03-13 | Payer: MEDICARE | Attending: Internal Medicine

## 2018-03-13 DIAGNOSIS — N200 Calculus of kidney: Secondary | ICD-10-CM

## 2018-04-27 ENCOUNTER — Inpatient Hospital Stay: Admit: 2018-04-27 | Payer: MEDICARE | Attending: Psychiatry

## 2018-04-27 DIAGNOSIS — F331 Major depressive disorder, recurrent, moderate: Secondary | ICD-10-CM

## 2018-04-27 MED ORDER — VENLAFAXINE SR 75 MG 24 HR CAP
75 mg | ORAL_CAPSULE | Freq: Every day | ORAL | 1 refills | Status: DC
Start: 2018-04-27 — End: 2018-05-29

## 2018-04-27 MED ORDER — LORAZEPAM 0.5 MG TAB
0.5 mg | ORAL_TABLET | Freq: Two times a day (BID) | ORAL | 2 refills | Status: DC | PRN
Start: 2018-04-27 — End: 2018-09-09

## 2018-04-27 NOTE — H&P (Signed)
[facility] EVALUATION    Name:  Andrea [name], Andrea [name]  MR#:  [id number]  DOB:  1946/09/30  ACCOUNT #:  [id number]  ADMIT DATE:  04/27/2018      [facility]    DATE OF EVALUATION:  04/27/2018    HISTORY OF PRESENT ILLNESS:  The patient is a 72 year old widowed white female who was referred by Dr. Brand Males concerning worsening symptoms of depression and anxiety, particularly since her husband passed away in 26-Jan-2023.  She indicates that she has been significantly anxious and likely depressed since her husband died, and then went into description of just how complex and complicated his help had been for a number of years leading up to his death, and how many things went wrong with his treatment.  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  That led to a lot of care taking on the part of the patient towards her husband, but then he started to develop peripheral vascular problems, which were never discovered until essentially it was too late.  He went to [facility] for surgery, but had multiple complications including lacerated bowels, breaking down the anastomoses of the large and small intestine, infections, etc.  His condition deteriorated, but not immediately, and the patient was spending essentially every moment of her day having to take care of him.  He had subsequently died suddenly from aspiration, and clearly this still is very distressing for her, although she believes as though she has dealt with most of the grief surrounding his death.    In looking at her history, however, it appears as though most of it has been spent taking care of her husband, as he was an alcoholic  before that, and much of her life centered around making sure he was healthy, that his needs were being met, that they were getting along well, etc.  His death has obviously left a huge hole in her life, which she is trying to fill, but the onset of some depression has kept that from happening.    Specifically, she has been very amotivated, increasingly withdrawn and isolative, and very anergic.  She is not sleeping well with significant middle insomnia, and although she states her appetite is adequate, she has lost weight at times.  She states her concentration has been impacted and she is more forgetful than normal, but she denies that she is having any crying spells anymore.  She has still been anhedonic and dysphoric, although she denies feeling fully helpless or hopeless.  She denies having any suicidal thoughts at all.    Anxiety wise, she finds that she worries constantly, and that she is often "spinning" psychologically, being very indecisive, unable to make choices, etc.  At times, she will have the sensation that she is choking and getting short of breath, but most of her anxiety is cognitive in nature.  She states that she needs to sell her house, although she has not contacted a real estate agent, and that she needs to move, yet she really does not know where she would go.  She has two children, one of whom is in [place] and one in [place], but she  does not think it would be best to move near them, so she really is in limbo in many many ways.    She states that she really does not have any substance abuse issues herself, although she does drink some alcohol, roughly six or seven shots per week.  She states it has never been problematic for her.  She denies any history or symptoms consistent with any mania, and denies any history of symptoms of psychosis or other psychiatric issues.    PAST PSYCHIATRIC HISTORY:  She has never been hospitalized and has not really seen any psychiatrists, but she has had  some therapy in the past, some of it being couple's therapy dealing with her husband's alcoholism.  She was put on Celexa or Lexapro many years ago when she went through menopause and was somewhat depressed, and it worked well for some time.  However, she was switched from Lexapro over to Effexor roughly a couple of months ago, but only taking 37.5 mg daily.  She did have some difficulty coming off the Lexapro due to withdrawal symptoms.    PAST MEDICAL HISTORY:  1.  Hypertension.  2.  Rotator cuff repair.  3.  GERD.    CURRENT MEDICATIONS:  1.  Effexor XR 37.5 mg daily.  2.  Lorazepam 0.5 mg p.r.n. - at this point she is taking only about one to two doses per week.  3.  Atenolol 25 mg daily.  4.  Hydralazine 50 mg b.i.d.  5.  Lisinopril 40 mg daily.    ALLERGIES:  PERCOCET AND NORCO MAKE HER "LOOPY."    FAMILY HISTORY:  Her daughter and son are both alcoholic.  She denies any other family psychiatric history.    SOCIAL HISTORY:  She has been widowed for a year and 3 months.  She has two children, son who lives in [place] and a daughter who lives in [place].  She has a Ph.D. in Microbiology, but stopped working many years ago as her husband was making money in [place], and she did not need to work.  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  She no longer smokes, having given that up years ago.    MENTAL STATUS EXAM:  The patient is noted to be a casually dressed, adequately groomed 72 year old who appeared slightly younger than her stated age.  She was pleasant, cooperative, attentive, and fairly verbal.  She became quite animated when discussing her husband's health related issues and all the frustrations dealing with doctors, poor outcomes, etc.  She did endorse having some depressive symptoms as noted above, including some mild to moderate neurovegetative dysfunction.   She denied having any manic or hypomanic symptoms, although her speech was a bit pressured at moments mostly due to her anger and anxiety.  There was no evidence of any psychosis such as hallucinations or delusional thinking and her thoughts were fairly organized and coherent.  Her judgment and insight appear to be reasonably good.  Cognitively, her fund of knowledge is above average.  She did have some slight deficits in concentration and recall/short-term memory likely due to poor concentration.    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  AXIS II:  Deferred.  AXIS III:  Hypertension; gastroesophageal reflux disease; rotator cuff repair.    PLAN:  1.  At this point, we will continue the Effexor XR, but increase the dose to 75 mg daily, and possibly further depending upon her response going forward - #30 capsules with one refill.  2.  We will continue the Ativan, but I indicated that she should use it a bit more liberally to better control her anxiety symptoms in the short term - #30 pills with two refills ordered at 0.5 mg b.i.d. p.r.n.  3.  She wants to hold off on any counseling or therapy, but may consider that going forward, but we first want to see if we can improve her mood and reduce her anxiety level so that she may be better able to sort through her choices and options going forward.  4.  She will follow up with me in the next 6 weeks, sooner if needed.      Heriberto Antigua, MD      [id number]  D:  04/27/2018 16:50  T:  04/27/2018 16:55  JOB #:  [id number]  CC:  June B Daffeh, MD

## 2018-04-27 NOTE — H&P (Signed)
Andrea [name] EVALUATION    Name:  Andrea, [name]  MR#:  [id number]  DOB:  10-31-1945  ACCOUNT #:  [id number]  ADMIT DATE:  04/27/2018      [facility]    DATE OF EVALUATION:  04/27/2018    HISTORY OF PRESENT ILLNESS:  The patient is a 72 year old widowed white female who was referred by Dr. Brand Males concerning worsening symptoms of depression and anxiety, particularly since her husband passed away in 02/09/2023.  She indicates that she has been significantly anxious and likely depressed since her husband died, and then went into description of just how complex and complicated his help had been for a number of years leading up to his death, and how many things went wrong with his treatment.  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  That led to a lot of care taking on the part of the patient towards her husband, but then he started to develop peripheral vascular problems, which were never discovered until essentially it was too late.  He went to [facility] for surgery, but had multiple complications including lacerated bowels, breaking down the anastomoses of the large and small intestine, infections, etc.  His condition deteriorated, but not immediately, and the patient was spending essentially every moment of her day having to take care of him.  He had subsequently died suddenly from aspiration, and clearly this still is very distressing for her, although she believes as though she has dealt with most of the grief surrounding his death.    In looking at her history, however, it appears as though most of it has  been spent taking care of her husband, as he was an alcoholic before that, and much of her life centered around making sure he was healthy, that his needs were being met, that they were getting along well, etc.  His death has obviously left a huge hole in her life, which she is trying to fill, but the onset of some depression has kept that from happening.    Specifically, she has been very amotivated, increasingly withdrawn and isolative, and very anergic.  She is not sleeping well with significant middle insomnia, and although she states her appetite is adequate, she has lost weight at times.  She states her concentration has been impacted and she is more forgetful than normal, but she denies that she is having any crying spells anymore.  She has still been anhedonic and dysphoric, although she denies feeling fully helpless or hopeless.  She denies having any suicidal thoughts at all.    Anxiety wise, she finds that she worries constantly, and that she is often "spinning" psychologically, being very indecisive, unable to make choices, etc.  At times, she will have the sensation that she is choking and getting short of breath, but most of her anxiety is cognitive in nature.  She states that she needs to sell her house, although she has not contacted a real estate agent, and that she needs to move, yet she really does not know where she would go.  She has two children, one of whom is in [place] and one in [place], but she  does not think it would be best to move near them, so she really is in limbo in many many ways.    She states that she really does not have any substance abuse issues herself, although she does drink some alcohol, roughly six or seven shots per week.  She states it has never been problematic for her.  She denies any history or symptoms consistent with any mania, and denies any history of symptoms of psychosis or other psychiatric issues.     PAST PSYCHIATRIC HISTORY:  She has never been hospitalized and has not really seen any psychiatrists, but she has had some therapy in the past, some of it being couple's therapy dealing with her husband's alcoholism.  She was put on Celexa or Lexapro many years ago when she went through menopause and was somewhat depressed, and it worked well for some time.  However, she was switched from Lexapro over to Effexor roughly a couple of months ago, but only taking 37.5 mg daily.  She did have some difficulty coming off the Lexapro due to withdrawal symptoms.    PAST MEDICAL HISTORY:  1.  Hypertension.  2.  Rotator cuff repair.  3.  GERD.    CURRENT MEDICATIONS:  1.  Effexor XR 37.5 mg daily.  2.  Lorazepam 0.5 mg p.r.n. - at this point she is taking only about one to two doses per week.  3.  Atenolol 25 mg daily.  4.  Hydralazine 50 mg b.i.d.  5.  Lisinopril 40 mg daily.    ALLERGIES:  PERCOCET AND NORCO MAKE HER "LOOPY."    FAMILY HISTORY:  Her daughter and son are both alcoholic.  She denies any other family psychiatric history.    SOCIAL HISTORY:  She has been widowed for a year and 3 months.  She has two children, son who lives in [place] and a daughter who lives in [place].  She has a Ph.D. in Microbiology, but stopped working many years ago as her husband was making money in [place], and she did not need to work.  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  She no longer smokes, having given that up years ago.    MENTAL STATUS EXAM:  The patient is noted to be a casually dressed, adequately groomed 72 year old who appeared slightly younger than her stated age.  She was pleasant, cooperative, attentive, and fairly verbal.  She became quite animated when discussing her husband's health related issues and all the frustrations dealing with doctors, poor outcomes, etc.   She did endorse having some depressive symptoms as noted above, including some mild to moderate neurovegetative dysfunction.  She denied having any manic or hypomanic symptoms, although her speech was a bit pressured at moments mostly due to her anger and anxiety.  There was no evidence of any psychosis such as hallucinations or delusional thinking and her thoughts were fairly organized and coherent.  Her judgment and insight appear to be reasonably good.  Cognitively, her fund of knowledge is above average.  She did have some slight deficits in concentration and recall/short-term memory likely due to poor concentration.    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  AXIS II:  Deferred.  AXIS III:  Hypertension; gastroesophageal reflux disease; rotator cuff repair.    PLAN:  1.  At this point, we will continue the Effexor XR, but increase the dose to 75 mg daily, and possibly further depending upon her response going forward - #30 capsules with one refill.  2.  We will continue the Ativan, but I indicated that she should use it a bit more liberally to better control her anxiety symptoms in the short term - #30 pills with two refills ordered at 0.5 mg b.i.d. p.r.n.  3.  She wants to hold off on any counseling or therapy, but may consider that going forward, but we first want to see if we can improve her mood and reduce her anxiety level so that she may be better able to sort through her choices and options going forward.  4.  She will follow up with me in the next 6 weeks, sooner if needed.      Heriberto Antigua, MD      [id number]  D:  04/27/2018 16:50  T:  04/27/2018 16:55  JOB #:  [id number]  CC:  June B Daffeh, MD

## 2018-06-02 ENCOUNTER — Inpatient Hospital Stay: Admit: 2018-06-02 | Payer: MEDICARE | Attending: Psychiatry

## 2018-06-02 MED ORDER — SERTRALINE 50 MG TAB
50 mg | ORAL_TABLET | Freq: Every day | ORAL | 1 refills | Status: DC
Start: 2018-06-02 — End: 2018-06-10

## 2018-06-02 NOTE — Behavioral Health Treatment Team (Signed)
INTERVAL HISTORY: Andrea [name] is a 72 year old widowed white female following up with me 5 weeks since her initial appointment re: significant symptoms of Major Depression as well as Generalized Anxiety disorder. She came in today unscheduled but I was able to see her. Four days ago I instructed her to stop the Effexor XR we'd increased at the first appt (75 mg)due to severe SE's, likely from the noradrenergic effects.     She comes in today stating she's gotten worse since then and is having intense NM's of hurting people now, and she's even more labile and dysphoric objectively. She still has overwhelming stressors and issues since her husband died in 01/24/2023 and I strongly pushed for her to consider some Tx, which she agreed to. We discussed Rx issues and will stay with a serotonergic agent only given her SE's with Effexor at the higher dosage. Has had some PDW but no true SI at all. Provided a lot of insights and supportive Tx which seemed to help. Will begin Zoloft and increase to 50 mg. She'll use the Lorazepam more liberally and take 1 mg at a time  When needed. Wants to ry it for sleep before taking something else.    MENTAL STATUS EXAM:  Andrea [name] is noted to be a casually dressed, adequately groomed 72 year old who appeared slightly younger than her stated age.  She was pleasant and cooperative, but very anxious and dysphoric.  She became tearful on a couple of occasions, especially when discussing her husband's death.  She endorsed having more intense depressive symptoms as noted above, including some moderate neurovegetative dysfunction. She denied true SI or intent but has had some feelings of hopelessness and some vague PDW thoughts. She denied having any manic or hypomanic symptoms, although her speech was a bit pressured at moments mostly due to her level of anxiety.  There was no evidence of any psychosis such as hallucinations or delusional thinking and her thoughts were fairly organized and coherent.   Her judgment and insight appear to be reasonably good.  Cognitively, her fund of knowledge is above average.  She did have some slight deficits in concentration and recall/short-term memory likely due to poor concentration.  ??  DIAGNOSTIC IMPRESSION:  AXIS I:  Major Depression, recurrent, moderate severity (F33.1)               Generalized Anxiety disorder (F41.1).  AXIS II:  Deferred.  AXIS III:  Hypertension; gastroesophageal reflux disease; rotator cuff repair.  ??  PLAN:  1.  Start Zoloft at 25 mg x 2, then 50 mg daily-- #30 with one refill.  2.  Continue the Ativan, but increase the dose to 1 mg when needed and try it for insomnia as well. May need a different sleeper.  3.  Needs to start counseling/therapy--referred to [facility].  4.  She will follow up with me in 1 week as scheduled.

## 2018-06-02 NOTE — Behavioral Health Treatment Team (Signed)
INTERVAL HISTORY: Andrea [name] is a 72 year old widowed white female following up with me 5 weeks since her initial appointment re: significant symptoms of Major Depression as well as Generalized Anxiety disorder. She came in today unscheduled but I was able to see her. Four days ago I instructed her to stop the Effexor XR we'd increased at the first appt (75 mg)due to severe SE's, likely from the noradrenergic effects.     She comes in today stating she's gotten worse since then and is having intense NM's of hurting people now, and she's even more labile and dysphoric objectively. She still has overwhelming stressors and issues since her husband died in 2023-01-22 and I strongly pushed for her to consider some Tx, which she agreed to. We discussed Rx issues and will stay with a serotonergic agent only given her SE's with Effexor at the higher dosage. Has had some PDW but no true SI at all. Provided a lot of insights and supportive Tx which seemed to help. Will begin Zoloft and increase to 50 mg. She'll use the Lorazepam more liberally and take 1 mg at a time  When needed. Wants to ry it for sleep before taking something else.    MENTAL STATUS EXAM:  Andrea [name] is noted to be a casually dressed, adequately groomed 72 year old who appeared slightly younger than her stated age.  She was pleasant and cooperative, but very anxious and dysphoric.  She became tearful on a couple of occasions, especially when discussing her husband's death.  She endorsed having more intense depressive symptoms as noted above, including some moderate neurovegetative dysfunction. She denied true SI or intent but has had some feelings of hopelessness and some vague PDW thoughts. She denied having any manic or hypomanic symptoms, although her speech was a bit pressured at moments mostly due to her level of anxiety.  There was no evidence of any psychosis such as hallucinations or delusional thinking and her thoughts were fairly  organized and coherent.  Her judgment and insight appear to be reasonably good.  Cognitively, her fund of knowledge is above average.  She did have some slight deficits in concentration and recall/short-term memory likely due to poor concentration.  ??  DIAGNOSTIC IMPRESSION:  AXIS I:  Major Depression, recurrent, moderate severity (F33.1)               Generalized Anxiety disorder (F41.1).  AXIS II:  Deferred.  AXIS III:  Hypertension; gastroesophageal reflux disease; rotator cuff repair.  ??  PLAN:  1.  Start Zoloft at 25 mg x 2, then 50 mg daily-- #30 with one refill.  2.  Continue the Ativan, but increase the dose to 1 mg when needed and try it for insomnia as well. May need a different sleeper.  3.  Needs to start counseling/therapy--referred to [facility].  4.  She will follow up with me in 1 week as scheduled.

## 2018-06-03 ENCOUNTER — Ambulatory Visit: Payer: MEDICARE | Attending: Psychiatry

## 2018-06-10 ENCOUNTER — Inpatient Hospital Stay: Admit: 2018-06-10 | Payer: MEDICARE | Attending: Psychiatry

## 2018-06-10 MED ORDER — ESCITALOPRAM 5 MG TAB
5 mg | ORAL_TABLET | Freq: Every day | ORAL | 1 refills | Status: DC
Start: 2018-06-10 — End: 2018-06-22

## 2018-06-10 MED ORDER — BACLOFEN 5 MG TABLET
5 mg | ORAL_TABLET | Freq: Three times a day (TID) | ORAL | 1 refills | Status: DC | PRN
Start: 2018-06-10 — End: 2018-06-22

## 2018-06-10 NOTE — Behavioral Health Treatment Team (Signed)
INTERVAL HISTORY: Andrea [name] is a 72 year old widowed white female following up with me just 1 week since her last appointment re: significant symptoms of Major Depression as well as Generalized Anxiety disorder. She came in last week unscheduled but I was able to see her because four days earlier I had stopped the Effexor XR we'd increased at the first appt (75 mg) due to severe SE's, likely from the noradrenergic effects. She'd gotten worse since then with intense NM's and some increased emotional lability. We started Zoloft and I encouraged her to use the Ativan at a slightly higher dose.  ??  She comes in today stating she's "not good" due to ongoing anxiety and likely more depression than she recognizes. Since starting the Zoloft she's been less labile and tearful emotionally but her anxiety has worsened, mostly manifested by somatic sx's. She's had a lot of muscle tension, especially in her shoulders and neck which has been very bothersome. She's also been more drowsy, some of which may be due to the Lorazepam. She's taking that a little more regularly because she has times of feeling almost panic-stricken. Discussed my impressions that her sx's are due to anxiety but that she's also still dealing with a lot of depression as well. Provided a lot of insights and supportive Tx which seemed to help. Will go back to taking Lexapro as she's more comfortable with that and try low-dose Baclofen for the anxiety/muscle tension. Continue the Lorazepam for now as well.  ??  CURRENT MEDICATIONS:  1.  Zoloft 50 mg daily--on this 1 week  2.  Lorazepam 0.5-1 mg prn  ??  MENTAL STATUS EXAM: ??Andrea [name] is noted to be a casually dressed, adequately groomed 72 year old who appeared slightly younger than her stated age. ??She was more anxious and distressed, but not as labile emotionally. She still endorsed having significant depressive symptoms including some moderate neurovegetative dysfunction. However, she denied any SI or  intent or any feelings of hopelessness or PDW thoughts.??Her anxiety level was more intense today. She denied having any manic or hypomanic symptoms, although her speech was a bit pressured at moments mostly due to her level of anxiety. ??There was no evidence of any psychosis such as hallucinations or delusional thinking and her thoughts were fairly organized and coherent. ??Her judgment and insight appear to be reasonably good. ??Cognitively, her fund of knowledge is above average. ??    DIAGNOSTIC IMPRESSION:  AXIS I: ??Major Depression, recurrent, moderate severity (F33.1)   ????????????????????????Generalized??Anxiety disorder (F41.1).  AXIS II: ??Deferred.  AXIS III: ??Hypertension; gastroesophageal reflux disease; rotator cuff repair.    PLAN:  1. ??D/C the Zoloft and restart Lexapro at 5 mg daily--#30 with refills.  2. ??Continue the Ativan at 0.5-1 mg prn--has roughly 1-2 refills of #30.  3.  Begin a trial on Baclofen at 5 mg tid prn--#60 with 1 refill.  4.  Needs to start counseling/therapy--referred to [facility].  5. ??She will follow up with me in 2-3 weeks, sooner if needed.

## 2018-06-10 NOTE — Behavioral Health Treatment Team (Signed)
INTERVAL HISTORY: Andrea [name] is a 73 year old widowed white female following up with me just 1 week since her last appointment re: significant symptoms of Major Depression as well as Generalized Anxiety disorder. She came in last week unscheduled but I was able to see her because four days earlier I had stopped the Effexor XR we'd increased at the first appt (75 mg) due to severe SE's, likely from the noradrenergic effects. She'd gotten worse since then with intense NM's and some increased emotional lability. We started Zoloft and I encouraged her to use the Ativan at a slightly higher dose.  ??  She comes in today stating she's "not good" due to ongoing anxiety and likely more depression than she recognizes. Since starting the Zoloft she's been less labile and tearful emotionally but her anxiety has worsened, mostly manifested by somatic sx's. She's had a lot of muscle tension, especially in her shoulders and neck which has been very bothersome. She's also been more drowsy, some of which may be due to the Lorazepam. She's taking that a little more regularly because she has times of feeling almost panic-stricken. Discussed my impressions that her sx's are due to anxiety but that she's also still dealing with a lot of depression as well. Provided a lot of insights and supportive Tx which seemed to help. Will go back to taking Lexapro as she's more comfortable with that and try low-dose Baclofen for the anxiety/muscle tension. Continue the Lorazepam for now as well.  ??  CURRENT MEDICATIONS:  1.  Zoloft 50 mg daily--on this 1 week  2.  Lorazepam 0.5-1 mg prn  ??  MENTAL STATUS EXAM: ??Andrea [name] is noted to be a casually dressed, adequately groomed 72 year old who appeared slightly younger than her stated age. ??She was more anxious and distressed, but not as labile emotionally. She still endorsed having significant depressive symptoms including some  moderate neurovegetative dysfunction. However, she denied any SI or intent or any feelings of hopelessness or PDW thoughts.??Her anxiety level was more intense today. She denied having any manic or hypomanic symptoms, although her speech was a bit pressured at moments mostly due to her level of anxiety. ??There was no evidence of any psychosis such as hallucinations or delusional thinking and her thoughts were fairly organized and coherent. ??Her judgment and insight appear to be reasonably good. ??Cognitively, her fund of knowledge is above average. ??    DIAGNOSTIC IMPRESSION:  AXIS I: ??Major Depression, recurrent, moderate severity (F33.1)   ????????????????????????Generalized??Anxiety disorder (F41.1).  AXIS II: ??Deferred.  AXIS III: ??Hypertension; gastroesophageal reflux disease; rotator cuff repair.    PLAN:  1. ??D/C the Zoloft and restart Lexapro at 5 mg daily--#30 with refills.  2. ??Continue the Ativan at 0.5-1 mg prn--has roughly 1-2 refills of #30.  3.  Begin a trial on Baclofen at 5 mg tid prn--#60 with 1 refill.  4.  Needs to start counseling/therapy--referred to [facility].  5. ??She will follow up with me in 2-3 weeks, sooner if needed.

## 2018-06-22 ENCOUNTER — Inpatient Hospital Stay: Admit: 2018-06-22 | Payer: MEDICARE | Attending: Psychiatry

## 2018-06-22 MED ORDER — VENLAFAXINE SR 37.5 MG 24 HR CAP
37.5 mg | ORAL_CAPSULE | Freq: Every day | ORAL | 2 refills | Status: DC
Start: 2018-06-22 — End: 2018-09-09

## 2018-06-22 NOTE — Behavioral Health Treatment Team (Signed)
INTERVAL HISTORY: Andrea [name] is a 72 year old widowed white female following up with me 2 weeks since her last appointment re: significant symptoms of Major Depression as well as Generalized Anxiety disorder. I had stopped the Effexor XR we'd increased at the first appt (75 mg) due to severe SE's, likely from the noradrenergic effects. She'd gotten worse after that with intense NM's and some increased emotional lability so we started Zoloft. That may have helped her mood and lability a bit, but she was more anxious on it so we changed to Lexapro last time and tried Baclofen to help with anxiety and the severe muscle tension in her neck.  ??  She comes in today stating she's "depressed" and that she's basically no better. She hasn't seen any response to the Lexapro (only on it 12 days) and she's had bad SE's--sexual SE's and loose stools. She's also feeling more tired taking the Ativan, even just 0.5 mg/dose. She's fairly labile and dramatic about wanting immediate relief and that nothing I'm doing is helping her so I again spent time trying to educate her about the likely course of Tx, what options there are, etc. She feels she did best on 37.5 mg of Effexor XR and that it only went bad when it was increased. Will change the Lexapro to Effexor XR at 37.5 mg daily, only and have her use the Ativan only for anxiety (not for the loose stools) but take 0.25 mg/dose to try and reduce any drowsiness. She's still having a lot of muscle tension in her shoulders and neck but she's not as dramatic about how bad it is now. Hasn't looked into starting therapy.  ??  CURRENT MEDICATIONS:  1. ??Lexapro 5 mg daily  2. ??Lorazepam 0.5-1 mg prn--takes 0.5 mg once or twice a day at most  3.  Baclofen 5 mg tid prn--not really taking this  ??  MENTAL STATUS EXAM:????Andrea [name]??is noted to be a casually dressed, adequately groomed 72 year old who appeared slightly younger than her stated age. ??She was essentially the same except more dysphoric  and distressed emotionally, and less anxious than the last time. She endorsed??having significant??depressive symptoms including some significant neurovegetative dysfunction and agitation. However, she denied any SI or intent or any PDW thoughts.??Her anxiety level was still intense but slightly less so. She denied having any manic or hypomanic symptoms, although her speech was a bit pressured at moments mostly due to her??level of??anxiety/agitation. ??There was no evidence of any psychosis such as hallucinations or delusional thinking and her thoughts were fairly organized and coherent. ??Her judgment and insight appear to be only fair now, due to her dysphoria. ??Cognitively, her fund of knowledge is above average. ??  ??  DIAGNOSTIC IMPRESSION:  AXIS I: ??Major Depression, recurrent, moderate severity (F33.1)   ????????????????????????Generalized??Anxiety disorder (F41.1).  AXIS II: ??Deferred--some .  AXIS III: ??Hypertension; gastroesophageal reflux disease; rotator cuff repair.  ??  PLAN:  1.????D/C the Lexapro and Baclofen.   2.  Restart Effexor XR at only 37.5 mg daily--#30 with refills.  3.????Continue the Ativan at 0.5-1 mg prn--try taking only 0.25 mg doses to limit sedation--has 1-2 refills of #30 (0.5 mg).  4.  Needs to start??counseling/therapy--again referred to [facility].  5. ??She will follow up with me in??2-3??weeks, sooner if needed.

## 2018-06-22 NOTE — Behavioral Health Treatment Team (Signed)
INTERVAL HISTORY: Andrea [name] is a 72 year old widowed white female following up with me 2 weeks since her last appointment re: significant symptoms of Major Depression as well as Generalized Anxiety disorder. I had stopped the Effexor XR we'd increased at the first appt (75 mg) due to severe SE's, likely from the noradrenergic effects. She'd gotten worse after that with intense NM's and some increased emotional lability so we started Zoloft. That may have helped her mood and lability a bit, but she was more anxious on it so we changed to Lexapro last time and tried Baclofen to help with anxiety and the severe muscle tension in her neck.  ??  She comes in today stating she's "depressed" and that she's basically no better. She hasn't seen any response to the Lexapro (only on it 12 days) and she's had bad SE's--sexual SE's and loose stools. She's also feeling more tired taking the Ativan, even just 0.5 mg/dose. She's fairly labile and dramatic about wanting immediate relief and that nothing I'm doing is helping her so I again spent time trying to educate her about the likely course of Tx, what options there are, etc. She feels she did best on 37.5 mg of Effexor XR and that it only went bad when it was increased. Will change the Lexapro to Effexor XR at 37.5 mg daily, only and have her use the Ativan only for anxiety (not for the loose stools) but take 0.25 mg/dose to try and reduce any drowsiness. She's still having a lot of muscle tension in her shoulders and neck but she's not as dramatic about how bad it is now. Hasn't looked into starting therapy.  ??  CURRENT MEDICATIONS:  1. ??Lexapro 5 mg daily  2. ??Lorazepam 0.5-1 mg prn--takes 0.5 mg once or twice a day at most  3.  Baclofen 5 mg tid prn--not really taking this  ??  MENTAL STATUS EXAM:????Andrea [name]??is noted to be a casually dressed, adequately groomed 72 year old who appeared slightly younger than her stated age.  ??She was essentially the same except more dysphoric and distressed emotionally, and less anxious than the last time. She endorsed??having significant??depressive symptoms including some significant neurovegetative dysfunction and agitation. However, she denied any SI or intent or any PDW thoughts.??Her anxiety level was still intense but slightly less so. She denied having any manic or hypomanic symptoms, although her speech was a bit pressured at moments mostly due to her??level of??anxiety/agitation. ??There was no evidence of any psychosis such as hallucinations or delusional thinking and her thoughts were fairly organized and coherent. ??Her judgment and insight appear to be only fair now, due to her dysphoria. ??Cognitively, her fund of knowledge is above average. ??  ??  DIAGNOSTIC IMPRESSION:  AXIS I: ??Major Depression, recurrent, moderate severity (F33.1)   ????????????????????????Generalized??Anxiety disorder (F41.1).  AXIS II: ??Deferred--some .  AXIS III: ??Hypertension; gastroesophageal reflux disease; rotator cuff repair.  ??  PLAN:  1.????D/C the Lexapro and Baclofen.   2.  Restart Effexor XR at only 37.5 mg daily--#30 with refills.  3.????Continue the Ativan at 0.5-1 mg prn--try taking only 0.25 mg doses to limit sedation--has 1-2 refills of #30 (0.5 mg).  4.  Needs to start??counseling/therapy--again referred to [facility].  5. ??She will follow up with me in??2-3??weeks, sooner if needed.

## 2018-06-30 ENCOUNTER — Encounter: Attending: Psychiatry

## 2018-07-10 ENCOUNTER — Ambulatory Visit: Payer: MEDICARE | Attending: Psychiatry

## 2018-07-10 ENCOUNTER — Inpatient Hospital Stay: Admit: 2018-07-10 | Payer: MEDICARE | Attending: Psychiatry

## 2018-07-10 NOTE — Behavioral Health Treatment Team (Signed)
INTERVAL HISTORY: Andrea [name] is a 72 year old widowed white female following up with me??2.5 weeks??since her last??appointment re: significant symptoms of Major Depression as well as Generalized Anxiety disorder. I had??stopped??the Effexor XR we'd increased at the first appt (75 mg) due to severe SE's, likely from the noradrenergic effects. She'd gotten worse after that with intense NM's and some increased emotional lability so we started Zoloft but later changed to Lexapro due to increased anxiety. Last time we stopped the Lexapro (and Baclofen) and went back to low-dose Effexor XR because it's the last med she felt better taking and she was increasingly frustrated with her lack of improvement.  ??  She comes in today stating she's??"better" and that her mood has improved significantly after only 2+ weeks (30-40%). She noticed some improvement starting after only a few days and she's steadily been feeling a little better since then. She also started therapy with Aundria Mems and that's also helped greatly. She's still dealing with a number of stressors, yet she's coping with them much more easily now. Neurovegetative functioning has been better as well. She's using the Ativan sparingly and it's no linger causing any drowsiness. The muscle tension in her shoulders and neck has also lessened greatly. No SE's with the Effexor so far--won't plan on increasing it as that backfired before and she had bad SE's.   ??  CURRENT MEDICATIONS:  1.????Effexor XR 37.5??mg daily  2. ??Lorazepam??0.5-1??mg prn--takes 0.5 mg 3-4x/week  ??  MENTAL STATUS EXAM:????Andrea [name]??is noted to be a casually dressed, adequately groomed 72 year old who appeared slightly younger than her stated age. ??She was pleasant and cooperative and denied having??any significant??depressive symptoms at this point including no significant neurovegetative dysfunction or agitation.??She denied??any??SI or intent or any??PDW thoughts.??Her anxiety level was much less intense as  well.??She denied having any manic or hypomanic symptoms and there was no evidence of any psychosis such as hallucinations or delusional thinking, and her thoughts were fairly organized and coherent. ??Her judgment and insight appear to be better now and cognitively her fund of knowledge is above average. ??  ??  DIAGNOSTIC IMPRESSION:  AXIS I: ??Major Depression, recurrent, mild severity (F33.0)   ????????????????????????Generalized??Anxiety disorder (F41.1).  AXIS II: ??Deferred--some .  AXIS III: ??Hypertension; gastroesophageal reflux disease; rotator cuff repair.  ??  PLAN:  1.????Continue the Effexor XR at only 37.5 mg daily--has 2 refills.  2.????Continue the Ativan at 0.5-1 mg??prn--try taking only 0.25 mg doses to limit sedation--has 1-2 refills of #30 (0.5 mg).  3.????Continue the??counseling/therapy with Aund[facility] Mems.  4. ??She will follow up with me in??2 months, sooner if needed.

## 2018-07-10 NOTE — Behavioral Health Treatment Team (Signed)
INTERVAL HISTORY: Andrea [name] is a 72 year old widowed white female following up with me??2.5 weeks??since her last??appointment re: significant symptoms of Major Depression as well as Generalized Anxiety disorder. I had??stopped??the Effexor XR we'd increased at the first appt (75 mg) due to severe SE's, likely from the noradrenergic effects. She'd gotten worse after that with intense NM's and some increased emotional lability so we started Zoloft but later changed to Lexapro due to increased anxiety. Last time we stopped the Lexapro (and Baclofen) and went back to low-dose Effexor XR because it's the last med she felt better taking and she was increasingly frustrated with her lack of improvement.  ??  She comes in today stating she's??"better" and that her mood has improved significantly after only 2+ weeks (30-40%). She noticed some improvement starting after only a few days and she's steadily been feeling a little better since then. She also started therapy with Aundria Mems and that's also helped greatly. She's still dealing with a number of stressors, yet she's coping with them much more easily now. Neurovegetative functioning has been better as well. She's using the Ativan sparingly and it's no linger causing any drowsiness. The muscle tension in her shoulders and neck has also lessened greatly. No SE's with the Effexor so far--won't plan on increasing it as that backfired before and she had bad SE's.   ??  CURRENT MEDICATIONS:  1.????Effexor XR 37.5??mg daily  2. ??Lorazepam??0.5-1??mg prn--takes 0.5 mg 3-4x/week  ??  MENTAL STATUS EXAM:????Andrea [name]??is noted to be a casually dressed, adequately groomed 72 year old who appeared slightly younger than her stated age. ??She was pleasant and cooperative and denied having??any significant??depressive symptoms at this point including no significant neurovegetative dysfunction or agitation.??She denied??any??SI or intent or  any??PDW thoughts.??Her anxiety level was much less intense as well.??She denied having any manic or hypomanic symptoms and there was no evidence of any psychosis such as hallucinations or delusional thinking, and her thoughts were fairly organized and coherent. ??Her judgment and insight appear to be better now and cognitively her fund of knowledge is above average. ??  ??  DIAGNOSTIC IMPRESSION:  AXIS I: ??Major Depression, recurrent, mild severity (F33.0)   ????????????????????????Generalized??Anxiety disorder (F41.1).  AXIS II: ??Deferred--some .  AXIS III: ??Hypertension; gastroesophageal reflux disease; rotator cuff repair.  ??  PLAN:  1.????Continue the Effexor XR at only 37.5 mg daily--has 2 refills.  2.????Continue the Ativan at 0.5-1 mg??prn--try taking only 0.25 mg doses to limit sedation--has 1-2 refills of #30 (0.5 mg).  3.????Continue the??counseling/therapy with Aundria Mems.  4. ??She will follow up with me in??2 months, sooner if needed.

## 2018-09-09 ENCOUNTER — Inpatient Hospital Stay: Admit: 2018-09-09 | Payer: MEDICARE | Attending: Psychiatry

## 2018-09-09 DIAGNOSIS — F411 Generalized anxiety disorder: Secondary | ICD-10-CM

## 2018-09-09 MED ORDER — LORAZEPAM 0.5 MG TAB
0.5 mg | ORAL_TABLET | Freq: Every day | ORAL | 3 refills | Status: DC | PRN
Start: 2018-09-09 — End: 2019-01-15

## 2018-09-09 MED ORDER — VENLAFAXINE SR 37.5 MG 24 HR CAP
37.5 mg | ORAL_CAPSULE | Freq: Every day | ORAL | 3 refills | Status: DC
Start: 2018-09-09 — End: 2019-01-26

## 2018-09-09 NOTE — Behavioral Health Treatment Team (Signed)
INTERVAL HISTORY: Andrea [name] is a 72 year old widowed white female following up with me??2 months??since her last??appointment re: significant symptoms of Major Depression as well as Generalized Anxiety disorder. I had??stopped??the Effexor XR at the first appt (75 mg) due to severe noradrenergic SE's, but she became worse??after that??with intense NM's and some increased emotional lability so we started Zoloft but later changed to Lexapro due to increased anxiety. Three months ago??we stopped the Lexapro (and Baclofen) and went back to low-dose Effexor XR because it's the last med she felt better taking and she was increasingly frustrated with her lack of improvement. Last time she was starting to feel "better".  ??  She comes in today stating she's??"excellent"??and??that her mood has continued to improve significantly on the low dose Effexor XR. She's been feeling euthymic and affectively stable, and reports no real N/V dysfunction at all. She's sleeping and eating well, has good energy levels, and denies any anhedonia, etc. She's also still in therapy with Andrea [name] which has helped greatly, but she plans to stop that soon given her improvement. She's much less stressed now and will be going to stay in [place] with her son and DIL when their 4th baby comes in the next 6 weeks and she may end up moving there instead of [place] or somewhere else. She's using the Ativan sparingly and it does still help with the mild residual anxiety that she has at times.??No SE's with the Effexor so far. SHe wants to come off it sooner rather than later, and we discussed the risks, etc.   ??  CURRENT MEDICATIONS:  1.????Effexor XR 37.5??mg daily  2. ??Lorazepam??0.5??mg prn--takes??0.5 mg 2x/week  ??  MENTAL STATUS EXAM:????Andrea [name]??is noted to be a casually dressed, well groomed 72 year old who appeared slightly younger than her stated age. ??She was pleasant and cooperative and reported no real??depressive symptoms at all at this point. She was much more  animated and less irritable, and she denied having any significant??neurovegetative dysfunction and she denied??any??SI or PDW thoughts as well.??Her anxiety level was??much less??intense as well.??She denied having any manic or hypomanic symptoms and there was no evidence of any psychosis such as hallucinations or delusional thinking, and her thoughts were fairly organized and coherent. ??Her judgment and insight appear to be better now and cognitively her fund of knowledge is above average. ??  ??  DIAGNOSTIC IMPRESSION:  AXIS I: ??Major Depression, recurrent, in partial remission (F33.41)   ????????????????????????Generalized??Anxiety disorder--stable (F41.1).  AXIS II: ??Deferred--some??.  AXIS III: ??Hypertension; gastroesophageal reflux disease; rotator cuff repair.  ??  PLAN:  1.????Continue the Effexor XR at only 37.5 mg daily--#30 with 3 refills.  2.????Continue the Ativan at 0.5 mg??prn--#30??with 3 refills.  3.????Continue the??counseling/therapy with Andrea [name]. Will stop this soon  4. ??She will follow up with me in??4-5 months, sooner if needed. May want to come off meds at that time.

## 2018-09-09 NOTE — Behavioral Health Treatment Team (Signed)
INTERVAL HISTORY: Andrea [name] is a 73 year old widowed white female following up with me??2 months??since her last??appointment re: significant symptoms of Major Depression as well as Generalized Anxiety disorder. I had??stopped??the Effexor XR at the first appt (75 mg) due to severe noradrenergic SE's, but she became worse??after that??with intense NM's and some increased emotional lability so we started Zoloft but later changed to Lexapro due to increased anxiety. Three months ago??we stopped the Lexapro (and Baclofen) and went back to low-dose Effexor XR because it's the last med she felt better taking and she was increasingly frustrated with her lack of improvement. Last time she was starting to feel "better".  ??  She comes in today stating she's??"excellent"??and??that her mood has continued to improve significantly on the low dose Effexor XR. She's been feeling euthymic and affectively stable, and reports no real N/V dysfunction at all. She's sleeping and eating well, has good energy levels, and denies any anhedonia, etc. She's also still in therapy with Aundria Mems which has helped greatly, but she plans to stop that soon given her improvement. She's much less stressed now and will be going to stay in [place] with her son and DIL when their 4th baby comes in the next 6 weeks and she may end up moving there instead of [place] or somewhere else. She's using the Ativan sparingly and it does still help with the mild residual anxiety that she has at times.??No SE's with the Effexor so far. SHe wants to come off it sooner rather than later, and we discussed the risks, etc.   ??  CURRENT MEDICATIONS:  1.????Effexor XR 37.5??mg daily  2. ??Lorazepam??0.5??mg prn--takes??0.5 mg 2x/week  ??  MENTAL STATUS EXAM:????Andrea [name]??is noted to be a casually dressed, well groomed 72 year old who appeared slightly younger than her stated age. ??She was pleasant and cooperative and reported no real??depressive symptoms  at all at this point. She was much more animated and less irritable, and she denied having any significant??neurovegetative dysfunction and she denied??any??SI or PDW thoughts as well.??Her anxiety level was??much less??intense as well.??She denied having any manic or hypomanic symptoms and there was no evidence of any psychosis such as hallucinations or delusional thinking, and her thoughts were fairly organized and coherent. ??Her judgment and insight appear to be better now and cognitively her fund of knowledge is above average. ??  ??  DIAGNOSTIC IMPRESSION:  AXIS I: ??Major Depression, recurrent, in partial remission (F33.41)   ????????????????????????Generalized??Anxiety disorder--stable (F41.1).  AXIS II: ??Deferred--some??.  AXIS III: ??Hypertension; gastroesophageal reflux disease; rotator cuff repair.  ??  PLAN:  1.????Continue the Effexor XR at only 37.5 mg daily--#30 with 3 refills.  2.????Continue the Ativan at 0.5 mg??prn--#30??with 3 refills.  3.????Continue the??counseling/therapy with Aundria Mems. Will stop this soon  4. ??She will follow up with me in??4-5 months, sooner if needed. May want to come off meds at that time.

## 2018-12-07 ENCOUNTER — Encounter

## 2018-12-09 ENCOUNTER — Inpatient Hospital Stay: Admit: 2018-12-09 | Payer: MEDICARE | Attending: Internal Medicine

## 2018-12-09 DIAGNOSIS — R042 Hemoptysis: Secondary | ICD-10-CM

## 2018-12-09 LAB — CREATININE
Creatinine: 0.59 MG/DL (ref 0.55–1.02)
Creatinine: 0.59 MG/DL (ref 0.55–1.02)
EGFR IF NonAfrican American: >60 mL/min/{1.73_m2} (ref >60)
GFR African American: >60 mL/min/{1.73_m2} (ref >60)
GFR est AA: >60 mL/min/{1.73_m2} (ref >60)
GFR est non-AA: >60 mL/min/{1.73_m2} (ref >60)

## 2018-12-09 LAB — BUN
BUN: 11 MG/DL (ref 6–20)
BUN: 11 MG/DL (ref 6–20)

## 2018-12-09 MED ORDER — IOPAMIDOL 61 % IV SOLN
61 % | Freq: Once | INTRAVENOUS | Status: AC
Start: 2018-12-09 — End: 2018-12-09
  Administered 2018-12-09: 13:00:00 via INTRAVENOUS

## 2018-12-09 MED FILL — ISOVUE-300  61 % INTRAVENOUS SOLUTION: 300 mg iodine /mL (61 %) | INTRAVENOUS | Qty: 100

## 2018-12-10 NOTE — Progress Notes (Signed)
 This note will not be viewable in MyChart.    Oncology Navigator  Psychosocial Assessment    Reason for Assessment:    [] Depression  [] Anxiety  [] Caregiver Burden  [] Maladaptive Coping with Serious Illness   []  Social Work Referral [x]  Initial Assessment  []  Other     Sources of Information:    [x] Patient  [] Family  [] Staff  [] Medical Record    Advance Care Planning:  No flowsheet data found.   Patient does not have an advanced medical directive, and did not express interest in completing one today.    Mental Status:    [x] Alert  [] Lethargic  [] Unresponsive   []  Unable to assess   Oriented to:  [x] Person  [x] Place  [x] Time  [x] Situation      Barriers to Learning:    [] Language  [] Developmental  [] Cognitive  [] Altered Mental Status  [] Visual/Hearing Impairment  [] Unable to Read/Write  [] Motivational   [x] No Barriers Identified  [] Other:    Relationship Status:  [] Single  [] Married  [] Significant Other/Life Partner  [] Divorced  [] Separated  [x] Widowed      Living Circumstances:  [x] Lives Alone  [] Family/Significant Other in Household  [] Roommates  [] Children in the Home  [] Paid Caregivers  [] Assisted Living Facility/Group Home  [] Skilled Nursing Facility  [] Homeless  [] Incarcerated  [] Environmental/Care Concerns  [] other:    Employment Status:  [] Employed Full-time [] Employed Part-time [] Homemaker []  Disabled  [x]  Retired [] Other:    Support System:    [x] Strong  [] Fair  Corporate treasurer Concerns:    [] Uninsured  [] Limited Income/Resources  [] Non-Citizen  [x] No Concerns Identified  [] Financial POA:    [] Other:    Religious/Spiritual/Existential:  [] Strong Sense of Spirituality  [x] Involved in [employer]  [] Request Chaplain Visit  [] Expressing Spiritual/Existential Angst  [] No Concerns Identified    Coping with Illness:         Patient: Family/Caregiver:   Understanding and Acceptance of Illness/Prognosis  [x]  []    Strong Sense of Resilience [x]  []    Self Reflection [x]  []    Engaged Support System [x]   []    Does not Readily Discuss Illness []  []    Denial of Terminal Status []  []    Anger []  []    Depression [x]  []    Anxiety/Fear [x]  []    Bargaining []  []    Recent Diagnosis/Prognosis []  []    Difficulties with Body Image []  []    Loss of Identity []  []    Excessive Substance Use []  []    Mental Health History [x]  []    Enmeshed Relationships []  []    History of Loss []  []    Anticipatory Grief []  []    Concern for Complicated Grief []  []    Suicidal Ideation or Plan []  []    Unable to assess []  [x]             Narrative:   Called to follow up with the patient after she threatened to shoot herself if she did not get an appointment ASAP per patient service representative.  Patient will be seen for evaluation of CT results tomorrow.   This social worker notes records from 2015, that per Dr. 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  The patient shared that she has lost 20 lbs over the last year, and has been coughing up blood since the 14th of February, that looks like blood from a period.       Medical History - The patient shared that she was due to have  rotator cuff surgery in 2015, and had a PET scan in [place], [place].  She was told that there were spots on her lungs in 2015.  She denies having any history of cancer.  She explained that Dr. Ashley at [facility] called her at the time and wanted to do a biopsy, but she declined stating I am a Biochemist.  I understand what that could do, and I didn't want to stir the cancer up.  The patient spoke extensively of her husband's cancer journey, and frustrations with dealing with providers, and poor outcomes, later stating he's a dead guy now.      Living Situation - Patient is widowed and lives alone in [place], [place].  She is a retired Chief of Staff, wrote patents, and taught at the college level.  Offered active listening and emotional support as the patient  explained that her husband of 56 years passed away two years ago from cancer.  She explained I'm the medical person. He was counting on me.  I was going to keep bad things from happening, but I didn't.  The patient has a daughter in [place].  She also has a son in [place] .  The patient became tearful, explaining that she has struggled to find her purpose since her husband's passing, and she is trying to sell her home, but unsure of where to go next.      The patient shared that she has been attending church recently, and they encourage using psychedelics.  You know [facility] has a study allowing use of LSD and magic mushrooms.  There is a book called How to Change Your Mind, which is about the use of psychedelics.  I want to try them.      Mental State - The patient denies and suicidal or homicidal ideation or plans. She denies owning a gun, stating oh no honey, I'm to the left of center.  I don't own any guns and I would never commit suicide.  She takes a low dose of Effexor as well as Lorazepam as needed.  She exhibited loose and tangential thought patterns, as well as pressured speech.      She denies any official mental health diagnosis, but admits that she has high anxiety, and some symptoms of depression.  She spoke of hallucinations she had while taking a higher dose of Effexor many years ago, explaining I was disoriented, and had reoccurring nightmares that my husband had the body, but I had to chop him up.  Later, while I was awake, I saw a hand coming out of my bread box.  I am on a lower dose of that now.      Mental Health Providers - Patient has a psychiatrist - Dr. Sheena Hamilton, who she sees regularly.  She no longer sees a Veterinary surgeon, explaining that she didn't know what to talk about anymore during sessions.  Encouraged her to talk about grief, health challenges, and adjustment to retirement.  Encouraged her to resume counseling, and offered to assist her in finding a counselor.   Patient declined this offer for assistance.      Provided this social worker's contact information as well as information regarding the complementary services provided by the [employer].       Assessment/Action:   1.  Introduced self and role of this Child psychotherapist in the [employer]    2.  Informed the patient of the [facility] and available resources there.  3.  Continue to meet with the patient when she returns to the clinic for ongoing assessment of the patient's adjustment to her diagnosis and treatment.    4.  Ongoing psychosocial support as desired by patient.      Plan/Referral:   Behavioral health referral  Damien SHAUNNA Lessen, LCSW

## 2018-12-10 NOTE — Progress Notes (Signed)
This note will not be viewable in [place].    Oncology Navigator  Psychosocial Assessment    Reason for Assessment:    [] Depression  [] Anxiety  [] Caregiver Burden  [] Maladaptive Coping with Serious Illness   []  Social Work Referral [x]  Initial Assessment  []  Other     Sources of Information:    [x] Patient  [] Family  [] Staff  [] Medical Record    Advance Care Planning:  No flowsheet data found.   Patient does not have an advanced medical directive, and did not express interest in completing one today.    Mental Status:    [x] Alert  [] Lethargic  [] Unresponsive   []  Unable to assess   Oriented to:  [x] Person  [x] Place  [x] Time  [x] Situation      Barriers to Learning:    [] Language  [] Developmental  [] Cognitive  [] Altered Mental Status  [] Visual/Hearing Impairment  [] Unable to Read/Write  [] Motivational   [x] No Barriers Identified  [] Other:    Relationship Status:  [] Single  [] Married  [] Significant Other/Life Partner  [] Divorced  [] Separated  [x] Widowed      Living Circumstances:  [x] Lives Alone  [] Family/Significant Other in Household  [] Roommates  [] Children in the Home  [] Paid Caregivers  [] Assisted Living Facility/Group Home  [] Skilled Nursing Facility  [] Homeless  [] Incarcerated  [] Environmental/Care Concerns  [] other:    Employment Status:  [] Employed Full-time [] Employed Part-time [] Homemaker []  Disabled  [x]  Retired [] Other:    Support System:    [x] Strong  [] Fair  [] Limited    Financial/Legal Concerns:    [] Uninsured  [] Limited Income/Resources  [] Non-Citizen  [x] No Concerns Identified  [] Financial POA:    [] Other:    Religious/Spiritual/Existential:  [] Strong Sense of Spirituality  [x] Involved in [employer]  [] Request Chaplain Visit  [] Expressing Spiritual/Existential Angst  [] No Concerns Identified    Coping with Illness:         Patient: Family/Caregiver:   Understanding and Acceptance of Illness/Prognosis  [x]  []    Strong Sense of Resilience [x]  []    Self Reflection [x]  []     Engaged Support System [x]  []    Does not Readily Discuss Illness []  []    Denial of Terminal Status []  []    Anger []  []    Depression [x]  []    Anxiety/Fear [x]  []    Bargaining []  []    Recent Diagnosis/Prognosis []  []    Difficulties with Body Image []  []    Loss of Identity []  []    Excessive Substance Use []  []    Mental Health History [x]  []    Enmeshed Relationships []  []    History of Loss []  []    Anticipatory Grief []  []    Concern for Complicated Grief []  []    Suicidal Ideation or Plan []  []    Unable to assess []  [x]             Narrative:   Called to follow up with the patient after she threatened to shoot herself if she did not get an appointment "ASAP" per patient service representative.  Patient will be seen for evaluation of CT results tomorrow.   This Education officer, museum notes records from 2015, that per Dr. 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  She was told that there were spots on her lungs in 2015.  She denies having any history of cancer.  She explained that [facility]. Arcola Jansky at [facility] called her at the time and wanted to do a biopsy, but she declined stating "I am a Biochemist.  I understand what that could do, and I didn't want to stir the cancer up."  The patient spoke extensively of her husband's cancer journey, and frustrations with dealing with providers, and poor outcomes, later stating "he's a dead guy now."      Living Situation - Patient is widowed and lives alone in [place], [place].  She is a retired Market researcher, wrote patents, and taught at the college  level."  Offered active listening and emotional support as the patient explained that her husband of 59 years passed away two years ago from cancer.  She explained "I'm the medical person. He was counting on me.  I was going to keep bad things from happening, but I didn't."  The patient has a daughter in [place].  She also has a son in [place].  The patient became tearful, explaining that she has struggled to find her purpose since her husband's passing, and she is trying to sell her home, but unsure of where to go next.      The patient shared that she has been attending church recently, and they encourage "using psychedelics.  You know [facility] has a study allowing use of LSD and magic mushrooms.  There is a book called How to [place], which is about the use of psychedelics.  I want to try them."      Mental State - The patient denies and suicidal or homicidal ideation or plans. She denies owning a gun, stating "oh no honey, I'm to the left of center.  I don't own any guns and I would never commit suicide."  She takes a "low dose" of Effexor as well as Lorazepam as needed.  She exhibited loose and tangential thought patterns, as well as pressured speech.      She denies any official mental health diagnosis, but admits that she has high anxiety, and some symptoms of depression.  She spoke of hallucinations she had while taking a higher dose of Effexor many years ago, explaining "I was disoriented, and had reoccurring nightmares that my husband had the body, but I had to chop him up.  Later, while I was awake, I saw a hand coming out of my bread box.  I am on a lower dose of that now."      Mental Health Providers - Patient has a psychiatrist - Dr. Tomie China, who she sees regularly.  She no longer sees a counselor, explaining that she "didn't know what to talk about anymore" during sessions.  Encouraged her to talk about grief, health challenges, and adjustment to retirement.   Encouraged her to resume counseling, and offered to assist her in finding a counselor.  Patient declined this offer for assistance.      Provided this social worker's contact information as well as information regarding the complementary services provided by the [employer].       Assessment/Action:   1.  Introduced self and role of this Education officer, museum in the [employer].    2.  Informed the patient of the [facility] and available resources there.  3.  Continue to meet with the patient when she returns to the clinic for ongoing assessment of the patient???s adjustment to her diagnosis and treatment.    4.  Ongoing psychosocial support as desired by patient.      Plan/Referral:   Behavioral health referral  Criss Alvine, LCSW

## 2018-12-11 ENCOUNTER — Ambulatory Visit: Attending: Hematology & Oncology

## 2018-12-11 ENCOUNTER — Ambulatory Visit: Admit: 2018-12-11 | Discharge: 2018-12-11 | Payer: MEDICARE | Attending: Hematology & Oncology

## 2018-12-11 ENCOUNTER — Inpatient Hospital Stay: Admit: 2018-12-31 | Payer: MEDICARE

## 2018-12-11 DIAGNOSIS — R918 Other nonspecific abnormal finding of lung field: Secondary | ICD-10-CM

## 2018-12-11 NOTE — Progress Notes (Signed)
 Pt is here as a new Pt to discuss lung cancer, it was originally dx'd in 2015, but has been coughing up blood since November 13, 2018.    Visit Vitals  BP 131/60   Pulse 60   Temp 98 F (36.7 C)   Resp 18   Ht 5' 2 (1.575 m)   Wt 161 lb 8 oz (73.3 kg)   SpO2 97%   BMI 29.54 kg/m       Pain Scale: 0 - No pain/10  Pain Location:

## 2018-12-11 NOTE — Progress Notes (Signed)
This note will not be viewable in [place].    [employer]  Social Work Navigator Encounter     Asked colleague Retta Mac, NP, to provide a list of behavioral health resources compiled by this Education officer, museum, including emergency numbers, suicide hotline numbers, cancer support hotlines, and local providers for psychiatry and counseling.  This was provided to the patient in this social worker's absence.    Waldemar Dickens Carretto, LCSW

## 2018-12-11 NOTE — Progress Notes (Signed)
Progress Notes by Leta Jungling, MD at 12/11/18 1330                Author: Leta Jungling, MD  Service: --  Author Type: Physician       Filed: 01/12/19 0935  Encounter Date: 12/11/2018  Status: Addendum          Editor: Leta Jungling, MD (Physician)          Related Notes: Original Note by Leta Jungling, MD (Physician) filed at 12/22/18 [place] at [facility]   [place], [place] [address]   W: [phone]  F: [phone]        Reason for Visit:     Andrea [name] is a 73 y.o.  female who is seen in consultation for evaluation of lung mass.        Treatment History:     ??  12/09/18: CT with LUL mass and left-sided mediastinal lymph node   ??  PET with hypermetabolic lesion in lung and rectum   ??  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 She did not improve and was given a 10 day course of PCN. She still did not  improve, so a CT scan was ordered. The CT scan which showed a LUL lung mass and prominent left sided mediastinal lymph node. Patient was  subsequently referred to our office. Patient reports the mass was first found in 2014. She was seen by Dr. Earley Favor and the mass was watched  with repeat scans. She reports she has had 3 PET scans in the past at [facility]. The last PET was done in 2015.  Patient reports Dr. Earley Favor referred her for a biopsy with Dr. Ria Comment.   Notes from Dr. Ria Comment from 05/2014 show that patient was told she had a lung mass and cancer was the primary concern. Dr. Ria Comment discussed surgery and surgery was scheduled, but patient  later cancelled surgery and was never rescheduled.       Patient was a previous smoker; she quit 1992. Patient starting smoking at the age of 1 and smoked 1.5 ppd.      Patient is here today with her  daughter.        Past Medical History:        Diagnosis  Date         ?  Essential hypertension       ?  Gallstone           ?  GERD (gastroesophageal reflux disease)             Past Surgical History:         Procedure  Laterality  Date          ?  HX CHOLECYSTECTOMY    06/14/2014     ?  HX MOHS PROCEDURES    2011          ?  HX TONSILLECTOMY    1962           Social History          Tobacco Use         ?  Smoking status:  Former Smoker              Packs/day:  1.00         Years:  34.00         Pack years:  34.00       Substance Use Topics         ?  Alcohol use:  Yes              Alcohol/week:  7.0 standard drinks              Types:  7 Shots of liquor per week           Family History         Problem  Relation  Age of Onset          ?  Stroke  Mother       ?  Hypertension  Mother       ?  Stroke  Father       ?  Arthritis-osteo  Brother       ?  Cancer  Brother       ?  Heart Disease  Brother            ?  Kidney Disease  Brother            Current Outpatient Medications        Medication  Sig         ?  LORazepam (ATIVAN) 0.5 mg tablet  Take 1 Tab by mouth daily as needed for Anxiety.     ?  venlafaxine-SR (EFFEXOR-XR) 37.5 mg capsule  Take 1 Cap by mouth daily.     ?  amoxicillin (AMOXIL) 500 mg capsule  Take 500 mg by mouth three (3) times daily.     ?  hydrALAZINE (APRESOLINE) 50 mg tablet  Take 50 mg by mouth two (2) times a day.     ?  NIACIN, INOSITOL NIACINATE, (NIACIN NO FLUSH PO)  Take 1 Tab by mouth nightly.     ?  METHYLCELLULOSE  Take 500 mg by mouth nightly.     ?  lisinopril (PRINIVIL, ZESTRIL) 5 mg tablet  Take 40 mg by mouth daily.         ?  atenolol (TENORMIN) 25 mg tablet  Take 25 mg by mouth daily.          No current facility-administered medications for this visit.            Allergies        Allergen  Reactions         ?  Norco [Hydrocodone-Acetaminophen]  Other (comments)             "loopy feeling"         ?  Percocet [Oxycodone-Acetaminophen]  Other (comments)             "feeling  loopy"         Review of Systems: A complete review of systems was obtained, negative except as described above.        Physical Exam:        Visit Vitals      BP  131/60     Pulse  60     Temp  98 ??F (36.7 ??C)     Resp  18     Ht  '5\' 2"'$  (1.575 m)     Wt  161 lb 8 oz (73.3 kg)  SpO2  97%        BMI  29.54 kg/m??        ECOG PS: 1   General: No distress   Eyes: PERRLA, anicteric sclerae   HENT: Atraumatic, OP clear   Neck: Supple   Lymphatic: No cervical, supraclavicular, or axillary adenopathy   Respiratory: CTAB, normal respiratory effort   CV: Normal rate, regular rhythm, no murmurs, no peripheral edema   GI: Soft, nontender, nondistended, no masses, no hepatomegaly, no splenomegaly   MS: Normal gait and station   Skin: Warm and dry   Psych: Alert, oriented, appropriate affect, normal judgment/insight        Results:          Lab Results         Component  Value  Date/Time            WBC  4.5  03/02/2018 04:17 PM       HGB  11.8  03/02/2018 04:17 PM       HCT  34.4 (L)  03/02/2018 04:17 PM       PLATELET  127 (L)  03/02/2018 04:17 PM       MCV  96.4  03/02/2018 04:17 PM            ABS. NEUTROPHILS  4.3  03/02/2018 04:17 PM          Lab Results         Component  Value  Date/Time            Sodium  137  03/02/2018 04:17 PM       Potassium  3.1 (L)  03/02/2018 04:17 PM       Chloride  102  03/02/2018 04:17 PM       CO2  25  03/02/2018 04:17 PM       Glucose  101 (H)  03/02/2018 04:17 PM       BUN  11  12/09/2018 09:00 AM       Creatinine  0.59  12/09/2018 09:00 AM       GFR est AA  >60  12/09/2018 09:00 AM       GFR est non-AA  >60  12/09/2018 09:00 AM            Calcium  8.0 (L)  03/02/2018 04:17 PM          Lab Results         Component  Value  Date/Time            Bilirubin, total  0.7  03/02/2018 04:17 PM       ALT (SGPT)  112 (H)  03/02/2018 04:17 PM       AST (SGOT)  157 (H)  03/02/2018 04:17 PM       Alk. phosphatase  141 (H)  03/02/2018 04:17 PM       Protein, total  6.0 (L)  03/02/2018 04:17 PM        Albumin  3.0 (L)  03/02/2018 04:17 PM            Globulin  3.0  03/02/2018 04:17 PM        CT CHEST 12/09/18   IMPRESSION:   1. Presence of a spiculated soft tissue mass in the medial aspect of the left   upper lobe as described above.   2. Presence of a slightly prominent, left-sided mediastinal lymph node.   3. Normal appearance of the adrenal glands.  Records reviewed and summarized above.   Radiology report(s) reviewed above.        Assessment:     1) LUL mass with left-sided mediastinal lymph nodes   Will need a biopsy    Will need a PET scan for staging   Will need colonoscopy/flex sigmoid      2) Hemoptysis   Per patient started 11/07/2018   Likely secondary to # 1      3) Former smoker    45 pack year history   Quit smoking in 1992      4) Generalized anxiety disorder   On Effexor   Management per psychiatry        Plan:        ??  PET scan - scheduled for 12/17/18   ??  CT guided biopsy   ??  Labs today: CBC with diff, CMP, and CEA   ??  Follow up in office approximately 10 days      I appreciate the opportunity to participate in Andrea [name] 's care.

## 2018-12-11 NOTE — Progress Notes (Signed)
Pt is here as a new Pt to discuss lung cancer, it was originally dx'd in 2015, but has been coughing up blood since November 13, 2018.    Visit Vitals  BP 131/60   Pulse 60   Temp 98 ??F (36.7 ??C)   Resp 18   Ht 5\' 2"  (1.575 m)   Wt 161 lb 8 oz (73.3 kg)   SpO2 97%   BMI 29.54 kg/m??       Pain Scale: 0 - No pain/10  Pain Location:

## 2018-12-11 NOTE — Progress Notes (Signed)
This note will not be viewable in [facility].    [employer]  Social Work Navigator Encounter     Asked colleague Retta Mac, NP, to provide a list of behavioral health resources compiled by this Education officer, museum, including emergency numbers, suicide hotline numbers, cancer support hotlines, and local providers for psychiatry and counseling.  This was provided to the patient in this social worker's absence.    Waldemar Dickens Edwing Figley, LCSW

## 2018-12-11 NOTE — Progress Notes (Addendum)
[facility]  [place], [place] [address]  W: [phone]  F: [phone]    Reason for Visit:   Andrea [name] is a 73 y.o. female who is seen in consultation for evaluation of lung mass.    Treatment History:   ?? 12/09/18: CT with LUL mass and left-sided mediastinal lymph node  ?? PET with hypermetabolic lesion in lung and rectum  ?? 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 She did not improve and was given a 10 day course of PCN. She still did not improve, so a CT scan was ordered. The CT scan which showed a LUL lung mass and prominent left sided mediastinal lymph node. Patient was subsequently referred to our office. Patient reports the mass was first found in 2014. She was seen by Dr. Earley Favor and the mass was watched with repeat scans. She reports she has had 3 PET scans in the past at [facility]. The last PET was done in 2015.  Patient reports Dr. Earley Favor referred her for a biopsy with Dr. Ria Comment.  Notes from Dr. Ria Comment from 05/2014 show that patient was told she had a lung mass and cancer was the primary concern. Dr. Ria Comment discussed surgery and surgery was scheduled, but patient later cancelled surgery and was never rescheduled.     Patient was a previous smoker; she quit 1992. Patient starting smoking at the age of 55 and smoked 1.5 ppd.    Patient is here today with her daughter.    Past Medical History:   Diagnosis Date   ??? Essential hypertension    ??? Gallstone    ??? GERD (gastroesophageal reflux disease)       Past Surgical History:   Procedure Laterality Date   ??? HX CHOLECYSTECTOMY  06/14/2014   ??? HX MOHS PROCEDURES  2011   ??? HX TONSILLECTOMY  1962      Social History     Tobacco Use   ??? Smoking status: Former Smoker     Packs/day: 1.00     Years: 34.00     Pack years: 34.00    Substance Use Topics   ??? Alcohol use: Yes     Alcohol/week: 7.0 standard drinks     Types: 7 Shots of liquor per week      Family History   Problem Relation Age of Onset   ??? Stroke Mother    ??? Hypertension Mother    ??? Stroke Father    ??? Arthritis-osteo Brother    ??? Cancer Brother    ??? Heart Disease Brother    ??? Kidney Disease Brother      Current Outpatient Medications   Medication Sig   ??? LORazepam (ATIVAN) 0.5 mg tablet Take 1 Tab by mouth daily as needed for Anxiety.   ??? venlafaxine-SR (EFFEXOR-XR) 37.5 mg capsule Take 1 Cap by mouth daily.   ??? amoxicillin (AMOXIL) 500 mg capsule Take 500 mg by mouth three (3) times daily.   ??? hydrALAZINE (APRESOLINE) 50 mg tablet Take 50 mg by mouth two (2) times a day.   ??? NIACIN, INOSITOL NIACINATE, (NIACIN NO FLUSH PO) Take 1 Tab by mouth nightly.   ??? METHYLCELLULOSE Take 500 mg by mouth nightly.   ??? lisinopril (PRINIVIL, ZESTRIL) 5 mg tablet Take 40  mg by mouth daily.   ??? atenolol (TENORMIN) 25 mg tablet Take 25 mg by mouth daily.     No current facility-administered medications for this visit.       Allergies   Allergen Reactions   ??? Norco [Hydrocodone-Acetaminophen] Other (comments)     "loopy feeling"   ??? Percocet [Oxycodone-Acetaminophen] Other (comments)     "feeling loopy"      Review of Systems: A complete review of systems was obtained, negative except as described above.    Physical Exam:     Visit Vitals  BP 131/60   Pulse 60   Temp 98 ??F (36.7 ??C)   Resp 18   Ht 5' 2"  (1.575 m)   Wt 161 lb 8 oz (73.3 kg)   SpO2 97%   BMI 29.54 kg/m??     ECOG PS: 1  General: No distress  Eyes: PERRLA, anicteric sclerae  HENT: Atraumatic, OP clear  Neck: Supple  Lymphatic: No cervical, supraclavicular, or axillary adenopathy  Respiratory: CTAB, normal respiratory effort  CV: Normal rate, regular rhythm, no murmurs, no peripheral edema  GI: Soft, nontender, nondistended, no masses, no hepatomegaly, no splenomegaly  MS: Normal gait and station  Skin: Warm and dry   Psych: Alert, oriented, appropriate affect, normal judgment/insight    Results:     Lab Results   Component Value Date/Time    WBC 4.5 03/02/2018 04:17 PM    HGB 11.8 03/02/2018 04:17 PM    HCT 34.4 (L) 03/02/2018 04:17 PM    PLATELET 127 (L) 03/02/2018 04:17 PM    MCV 96.4 03/02/2018 04:17 PM    ABS. NEUTROPHILS 4.3 03/02/2018 04:17 PM     Lab Results   Component Value Date/Time    Sodium 137 03/02/2018 04:17 PM    Potassium 3.1 (L) 03/02/2018 04:17 PM    Chloride 102 03/02/2018 04:17 PM    CO2 25 03/02/2018 04:17 PM    Glucose 101 (H) 03/02/2018 04:17 PM    BUN 11 12/09/2018 09:00 AM    Creatinine 0.59 12/09/2018 09:00 AM    GFR est AA >60 12/09/2018 09:00 AM    GFR est non-AA >60 12/09/2018 09:00 AM    Calcium 8.0 (L) 03/02/2018 04:17 PM     Lab Results   Component Value Date/Time    Bilirubin, total 0.7 03/02/2018 04:17 PM    ALT (SGPT) 112 (H) 03/02/2018 04:17 PM    AST (SGOT) 157 (H) 03/02/2018 04:17 PM    Alk. phosphatase 141 (H) 03/02/2018 04:17 PM    Protein, total 6.0 (L) 03/02/2018 04:17 PM    Albumin 3.0 (L) 03/02/2018 04:17 PM    Globulin 3.0 03/02/2018 04:17 PM     CT CHEST 12/09/18  IMPRESSION:  1. Presence of a spiculated soft tissue mass in the medial aspect of the left  upper lobe as described above.  2. Presence of a slightly prominent, left-sided mediastinal lymph node.  3. Normal appearance of the adrenal glands.    Records reviewed and summarized above.  Radiology report(s) reviewed above.    Assessment:   1) LUL mass with left-sided mediastinal lymph nodes  Will need a biopsy   Will need a PET scan for staging  Will need colonoscopy/flex sigmoid    2) Hemoptysis  Per patient started 11/07/2018  Likely secondary to # 1    3) Former smoker   45 pack year history  Quit smoking in 1992    4) Generalized anxiety disorder  On Effexor  Management per psychiatry    Plan:     ?? PET scan - scheduled for 12/17/18  ?? CT guided biopsy  ?? Labs today: CBC with diff, CMP, and CEA   ?? Follow up in office approximately 10 days    I appreciate the opportunity to participate in Ms. Andrea [name]'s care.

## 2018-12-12 LAB — CBC WITH AUTO DIFFERENTIAL
Basophils %: 0 %
Basophils Absolute: 0 10*3/uL (ref 0.0–0.2)
Eosinophils %: 3 %
Eosinophils Absolute: 0.2 10*3/uL (ref 0.0–0.4)
Granulocyte Absolute Count: 0 10*3/uL (ref 0.0–0.1)
Hematocrit: 36.5 % (ref 34.0–46.6)
Hemoglobin: 12.2 g/dL (ref 11.1–15.9)
Immature Granulocytes: 0 %
Lymphocytes %: 22 %
Lymphocytes Absolute: 1.1 10*3/uL (ref 0.7–3.1)
MCH: 32.2 pg (ref 26.6–33.0)
MCHC: 33.4 g/dL (ref 31.5–35.7)
MCV: 96 fL (ref 79–97)
Monocytes %: 8 %
Monocytes Absolute: 0.4 10*3/uL (ref 0.1–0.9)
Neutrophils %: 67 %
Neutrophils Absolute: 3.5 10*3/uL (ref 1.4–7.0)
Platelets: 248 10*3/uL (ref 150–450)
RBC: 3.79 x10E6/uL (ref 3.77–5.28)
RDW: 11.5 % — ABNORMAL LOW (ref 11.7–15.4)
WBC: 5.2 10*3/uL (ref 3.4–10.8)

## 2018-12-12 LAB — COMPREHENSIVE METABOLIC PANEL
ALT: 10 IU/L (ref 0–32)
AST: 13 IU/L (ref 0–40)
Albumin/Globulin Ratio: 1.8 NA (ref 1.2–2.2)
Albumin: 4.1 g/dL (ref 3.7–4.7)
Alkaline Phosphatase: 67 IU/L (ref 39–117)
BUN: 12 mg/dL (ref 8–27)
Bun/Cre Ratio: 21 NA (ref 12–28)
CO2: 23 mmol/L (ref 20–29)
Calcium: 9.4 mg/dL (ref 8.7–10.3)
Chloride: 103 mmol/L (ref 96–106)
Creatinine: 0.57 mg/dL (ref 0.57–1.00)
EGFR IF NonAfrican American: 92 mL/min/{1.73_m2} (ref >59)
GFR African American: 106 mL/min/{1.73_m2} (ref >59)
Globulin, Total: 2.3 g/dL (ref 1.5–4.5)
Glucose: 91 mg/dL (ref 65–99)
Potassium: 4.2 mmol/L (ref 3.5–5.2)
Sodium: 142 mmol/L (ref 134–144)
Total Bilirubin: 0.4 mg/dL (ref 0.0–1.2)
Total Protein: 6.4 g/dL (ref 6.0–8.5)

## 2018-12-12 LAB — CEA
CEA: 2.2 ng/mL (ref 0.0–4.7)
CEA: 2.2 ng/mL (ref 0.0–4.7)

## 2018-12-12 LAB — CBC WITH AUTOMATED DIFF
ABS. BASOPHILS: 0 10*3/uL (ref 0.0–0.2)
ABS. EOSINOPHILS: 0.2 10*3/uL (ref 0.0–0.4)
ABS. IMM. GRANS.: 0 10*3/uL (ref 0.0–0.1)
ABS. MONOCYTES: 0.4 10*3/uL (ref 0.1–0.9)
ABS. NEUTROPHILS: 3.5 10*3/uL (ref 1.4–7.0)
Abs Lymphocytes: 1.1 10*3/uL (ref 0.7–3.1)
BASOPHILS: 0 %
EOSINOPHILS: 3 %
HCT: 36.5 % (ref 34.0–46.6)
HGB: 12.2 g/dL (ref 11.1–15.9)
IMMATURE GRANULOCYTES: 0 %
Lymphocytes: 22 %
MCH: 32.2 pg (ref 26.6–33.0)
MCHC: 33.4 g/dL (ref 31.5–35.7)
MCV: 96 fL (ref 79–97)
MONOCYTES: 8 %
NEUTROPHILS: 67 %
PLATELET: 248 10*3/uL (ref 150–450)
RBC: 3.79 x10E6/uL (ref 3.77–5.28)
RDW: 11.5 % — ABNORMAL LOW (ref 11.7–15.4)
WBC: 5.2 10*3/uL (ref 3.4–10.8)

## 2018-12-12 LAB — METABOLIC PANEL, COMPREHENSIVE
A-G Ratio: 1.8 (ref 1.2–2.2)
ALT (SGPT): 10 IU/L (ref 0–32)
AST (SGOT): 13 IU/L (ref 0–40)
Albumin: 4.1 g/dL (ref 3.7–4.7)
Alk. phosphatase: 67 IU/L (ref 39–117)
BUN/Creatinine ratio: 21 (ref 12–28)
BUN: 12 mg/dL (ref 8–27)
Bilirubin, total: 0.4 mg/dL (ref 0.0–1.2)
CO2: 23 mmol/L (ref 20–29)
Calcium: 9.4 mg/dL (ref 8.7–10.3)
Chloride: 103 mmol/L (ref 96–106)
Creatinine: 0.57 mg/dL (ref 0.57–1.00)
GFR est AA: 106 mL/min/{1.73_m2} (ref >59)
GFR est non-AA: 92 mL/min/{1.73_m2} (ref >59)
GLOBULIN, TOTAL: 2.3 g/dL (ref 1.5–4.5)
Glucose: 91 mg/dL (ref 65–99)
Potassium: 4.2 mmol/L (ref 3.5–5.2)
Protein, total: 6.4 g/dL (ref 6.0–8.5)
Sodium: 142 mmol/L (ref 134–144)

## 2018-12-15 ENCOUNTER — Telehealth

## 2018-12-15 NOTE — Telephone Encounter (Signed)
Patient called and stated that she would like a call back to discuss her scan.  CB# [phone]

## 2018-12-15 NOTE — Telephone Encounter (Signed)
Pt called asking about why biopsy needed to be done by bronchoscopy instead of needle biopsy.  Per Radiology, as message received from scheduling dept, Radiologist thinks Bronchoscopy would be a better way to get tissue for a biopsy.  Pt would like to speak to Dr Orvan Seen to ask if this means the cancer is worse than he thought.  Offered to help Pt make an appointment with Pulmonologist.  Pt has seen Dr Ria Comment in the past and was scheduled for biopsy with him, but cancelled in 2014. Offered to try to get another appointment in that group, but Pt would like Dr Orvan Seen advise on who she should see.  Pt crying off and on, seems very anxious. Assured Pt I would try to make an appointment for her and would call first thing in the morning.  Pt states "nevermind, I'll just look it up on the internet"  Will touch base with Pt again tomorrow, message forwarded to NP and MD.

## 2018-12-15 NOTE — Telephone Encounter (Signed)
Left message to return my call.

## 2018-12-16 NOTE — Telephone Encounter (Signed)
Attempted to call patient back to discuss biopsy, LVM to return call to office when able.    Returned call to patient's daughter, Barbaraann Barthel, who was with her mother, and HIPAA verified. Daughter would like to know if PET is considered "essential" or if it should be delayed due to coronavirus. Advised daughter that PET should be completed tomorrow. Daughter reports patient is comfortable with this. Also discussed biopsy as patient had called our office concerned yesterday that biopsy was to be done via bronchoscopy instead of needle biopsy as previously ordered. Advised that this was just based on location of tumor and not severity of disease. Daughter verbalized understanding and has also relayed this information to her mother. She believes that her mother feels better about this decision. They would like to be referred to a pulmonologist in the [place] area. Referral placed to [facility].

## 2018-12-16 NOTE — Telephone Encounter (Signed)
Attempted to call patient re: biopsy. LVM for patient to return call to office when able.

## 2018-12-16 NOTE — Telephone Encounter (Signed)
Patients daughter called and would like a call back regarding patients scans being rescheduled or not. CB# [phone]

## 2018-12-17 ENCOUNTER — Inpatient Hospital Stay: Admit: 2018-12-17 | Payer: MEDICARE | Attending: Nurse Practitioner

## 2018-12-17 DIAGNOSIS — R918 Other nonspecific abnormal finding of lung field: Secondary | ICD-10-CM

## 2018-12-17 MED ORDER — SODIUM CHLORIDE 0.9 % IJ SYRG
Freq: Once | INTRAMUSCULAR | Status: AC
Start: 2018-12-17 — End: 2018-12-17
  Administered 2018-12-17: 16:00:00 via INTRAVENOUS

## 2018-12-17 MED ORDER — F-18 FLUORODEOXYGLUCOSE
Freq: Once | Status: AC
Start: 2018-12-17 — End: 2018-12-17
  Administered 2018-12-17: 16:00:00 via INTRAVENOUS

## 2018-12-22 ENCOUNTER — Telehealth

## 2018-12-22 NOTE — Telephone Encounter (Signed)
Patient returned the phone call she was worried that she had metastatic lung cancer we talked about the fact that the PET scan suggest that we are dealing with a early lung cancer lesion even though it is 3.4 cm in size we will get her set up to see the thoracic surgeons to consider removal of the lesion and.  We will also get her set up to see a gastroenterologist to consider a flexible sigmoidoscopy to evaluate the rectal abnormality that was seen on PET scan.

## 2018-12-22 NOTE — Telephone Encounter (Signed)
Patient called and stated she would like a callback with the results of her PET/CT scan.    [phone]

## 2018-12-22 NOTE — Telephone Encounter (Signed)
I tried to call the patient to talk with her about the her PET scan left a message on her answering machine to her and the asked her to call us back hopefully she will return call.

## 2018-12-22 NOTE — Telephone Encounter (Signed)
Call placed to patient to notify that MD would like to see her in office to review scan results. Patient tearful and request results over the phone, reports she does not want to drive to [facility] without knowing results.     MD to call patient.

## 2018-12-31 ENCOUNTER — Ambulatory Visit: Attending: Thoracic Surgery (Cardiothoracic Vascular Surgery)

## 2018-12-31 ENCOUNTER — Ambulatory Visit: Admit: 2018-12-31 | Payer: MEDICARE | Attending: Thoracic Surgery (Cardiothoracic Vascular Surgery)

## 2018-12-31 DIAGNOSIS — C3412 Malignant neoplasm of upper lobe, left bronchus or lung: Secondary | ICD-10-CM

## 2018-12-31 NOTE — Progress Notes (Signed)
New Patient.  Cavitating Mass in LUL.      1. Have you been to the ER, urgent care clinic since your last visit?  Hospitalized since your last visit?No    2. Have you seen or consulted any other health care providers outside of the [facility] since your last visit?  Include any pap smears or colon screening. No

## 2018-12-31 NOTE — Progress Notes (Signed)
Asked to see Andrea Andrea [name] re: LUL mass and hemoptysis    Referred by Dr. Randol Kern    Andrea Andrea [name] is a pleasant 73 y/o lady with a past medical history significant for HTN and GERD.  She is referred for surgical workup and possible resection of a LUL mass.  She reports that in 2010 she had a LUL nodule incidentally discovered.  This was followed and in 2015 it had grown and she was seen by a Thoracic surgeon who presented her case to tumor board and biopsy/resection was recommended.  Ultimately she decided against surgery at that time.    She now presents with hemoptysis.  She has been seen by Oncology and has had repeat Chest CT and PET scan.  She coughs up blood tinged sputum almost every night.  She denies pleuritic chest pain.    She and her family describe her as active, able to walk more than a mile and she works outside.    Allergies   Allergen Reactions   ??? Norco [Hydrocodone-Acetaminophen] Other (comments)     "loopy feeling"   ??? Percocet [Oxycodone-Acetaminophen] Other (comments)     "feeling loopy"       PMHx: HTN, GERD    PSHx: tonsillectomy, cholecystectomy    SocHx:  Former smoker    Family History   Problem Relation Age of Onset   ??? Stroke Mother    ??? Hypertension Mother    ??? Stroke Father    ??? Arthritis-osteo Brother    ??? Cancer Brother    ??? Heart Disease Brother    ??? Kidney Disease Brother        Outpatient Medications Marked as Taking for the 12/31/18 encounter (Office Visit) with Wardell Honour, MD   Medication Sig Dispense Refill   ??? lisinopriL (PRINIVIL, ZESTRIL) 20 mg tablet Take 20 mg by mouth daily.     ??? hydroCHLOROthiazide (HYDRODIURIL) 25 mg tablet Take 25 mg by mouth daily.     ??? omeprazole (PRILOSEC) 10 mg capsule Take 10 mg by mouth daily.     ??? prednisoLONE acetate (PRED FORTE) 1 % ophthalmic suspension Administer 1 Drop to right eye daily.     ??? LORazepam (ATIVAN) 0.5 mg tablet Take 1 Tab by mouth daily as needed for Anxiety. 30 Tab 3   ??? venlafaxine-SR  (EFFEXOR-XR) 37.5 mg capsule Take 1 Cap by mouth daily. 30 Cap 3   ??? hydrALAZINE (APRESOLINE) 50 mg tablet Take 50 mg by mouth daily.     ??? atenolol (TENORMIN) 25 mg tablet Take 25 mg by mouth daily.         ROS:    Constitutional- denies weight loss or gain  HEENT- denies dysphagia  Neuro- denies syncope  Optho- no recent changes in vision  Resp- endorses hemoptysis  CV- denies angina or palpitations  GI- denies abd pain  GU- no complaints  ID- denies recent fevers  Vasc- denies claudication    Blood pressure 145/73, pulse (!) 59, resp. rate 18, height 5\' 2"  (1.575 m), weight 150 lb (68 kg), SpO2 97 %.    On exam she is seated upright  Alert and oriented  Well developed  Appears her stated age  Not tachypneic  Normal speech, normal affect  No cervical or supraclavicular lymphadenopathy  Lungs CTA b/l  No chest wall tenderness  Rrr, no murmurs  Radial pulses palp b/l  abd sntnd, no organomegaly  LE non edematous    ==============================  Cr 0.57    WBC 5.2  Hgb 12.2  Plts 248  ==============================    I have personally reviewed her Chest CT and PET scan.  She has a 4.3cm spiculated cavitary lesion LUL medially and apically.  This mass abuts the aortic arch and the fat plane is not always visible.  There is no definitive lymphadenopathy.  The mass is PET avid, no hilar or mediastinal PET avid LNs.  There is a PET avid rectal lesion.     ==============================    PFTs- pending    ==============================    Diagnoses  1: LUL lung cancer  2: Hemoptysis    =============================    Andrea. Lupu has a symptomatic LUL mass that has enlarged over the past ~ten years.  It is now intensely PET avid and abuts the aortic arch. Given her smoking history this should be considered a malignancy until proven otherwise. It does appear localized except for a small rectal lesion on PET that clinically is not related.    I have reviewed her case with Dr. Orvan Seen and I have reviewed her previous  Thoracic notes from Dr. Ria Comment.  Because this is a symptomatic lesion with hemoptysis we should make every effort to resect it.  I discussed robotic left upper lobectomy with her and her daughter.  The only question is how "stuck" to the aortic arch this lesion will be.  That can only be determined intraoperatively.    She sees GI tomorrow.    She needs PFTs which I will arrange.    Will post for April 10 or 17th.    Her main comorbidity is her age.  No specific cardiac history or or symptoms.    Thank you for allowing [place] to care for Andrea [name]    I spent 60 minutes with Hermelinda Medicus and their family, greater than 50% of which was spent in counseling and education reviewing their films, discussing surgical options and formulating a future care plan.

## 2018-12-31 NOTE — Progress Notes (Signed)
Asked to see Andrea Andrea [name] re: LUL mass and hemoptysis    Referred by Dr. Randol Kern    Andrea Andrea [name] is a pleasant 73 y/o lady with a past medical history significant for HTN and GERD.  She is referred for surgical workup and possible resection of a LUL mass.  She reports that in 2010 she had a LUL nodule incidentally discovered.  This was followed and in 2015 it had grown and she was seen by a Thoracic surgeon who presented her case to tumor board and biopsy/resection was recommended.  Ultimately she decided against surgery at that time.    She now presents with hemoptysis.  She has been seen by Oncology and has had repeat Chest CT and PET scan.  She coughs up blood tinged sputum almost every night.  She denies pleuritic chest pain.    She and her family describe her as active, able to walk more than a mile and she works outside.    Allergies   Allergen Reactions   ??? Norco [Hydrocodone-Acetaminophen] Other (comments)     "loopy feeling"   ??? Percocet [Oxycodone-Acetaminophen] Other (comments)     "feeling loopy"       PMHx: HTN, GERD    PSHx: tonsillectomy, cholecystectomy    SocHx:  Former smoker    Family History   Problem Relation Age of Onset   ??? Stroke Mother    ??? Hypertension Mother    ??? Stroke Father    ??? Arthritis-osteo Brother    ??? Cancer Brother    ??? Heart Disease Brother    ??? Kidney Disease Brother        Outpatient Medications Marked as Taking for the 12/31/18 encounter (Office Visit) with Wardell Honour, MD   Medication Sig Dispense Refill   ??? lisinopriL (PRINIVIL, ZESTRIL) 20 mg tablet Take 20 mg by mouth daily.     ??? hydroCHLOROthiazide (HYDRODIURIL) 25 mg tablet Take 25 mg by mouth daily.     ??? omeprazole (PRILOSEC) 10 mg capsule Take 10 mg by mouth daily.     ??? prednisoLONE acetate (PRED FORTE) 1 % ophthalmic suspension Administer 1 Drop to right eye daily.     ??? LORazepam (ATIVAN) 0.5 mg tablet Take 1 Tab by mouth daily as needed for Anxiety. 30 Tab 3   ??? venlafaxine-SR  (EFFEXOR-XR) 37.5 mg capsule Take 1 Cap by mouth daily. 30 Cap 3   ??? hydrALAZINE (APRESOLINE) 50 mg tablet Take 50 mg by mouth daily.     ??? atenolol (TENORMIN) 25 mg tablet Take 25 mg by mouth daily.         ROS:    Constitutional- denies weight loss or gain  HEENT- denies dysphagia  Neuro- denies syncope  Optho- no recent changes in vision  Resp- endorses hemoptysis  CV- denies angina or palpitations  GI- denies abd pain  GU- no complaints  ID- denies recent fevers  Vasc- denies claudication    Blood pressure 145/73, pulse (!) 59, resp. rate 18, height 5\' 2"  (1.575 m), weight 150 lb (68 kg), SpO2 97 %.    On exam she is seated upright  Alert and oriented  Well developed  Appears her stated age  Not tachypneic  Normal speech, normal affect  No cervical or supraclavicular lymphadenopathy  Lungs CTA b/l  No chest wall tenderness  Rrr, no murmurs  Radial pulses palp b/l  abd sntnd, no organomegaly  LE non edematous    ==============================  Cr 0.57    WBC 5.2  Hgb 12.2  Plts 248  ==============================    I have personally reviewed her Chest CT and PET scan.  She has a 4.3cm spiculated cavitary lesion LUL medially and apically.  This mass abuts the aortic arch and the fat plane is not always visible.  There is no definitive lymphadenopathy.  The mass is PET avid, no hilar or mediastinal PET avid LNs.  There is a PET avid rectal lesion.     ==============================    PFTs- pending    ==============================    Diagnoses  1: LUL lung cancer  2: Hemoptysis    =============================    Andrea. Vogelgesang has a symptomatic LUL mass that has enlarged over the past ~ten years.  It is now intensely PET avid and abuts the aortic arch. Given her smoking history this should be considered a malignancy until proven otherwise. It does appear localized except for a small rectal lesion on PET that clinically is not related.    I have reviewed her case with Dr. Orvan Seen and I have reviewed her previous  Thoracic notes from Dr. Ria Comment.  Because this is a symptomatic lesion with hemoptysis we should make every effort to resect it.  I discussed robotic left upper lobectomy with her and her daughter.  The only question is how "stuck" to the aortic arch this lesion will be.  That can only be determined intraoperatively.    She sees GI tomorrow.    She needs PFTs which I will arrange.    Will post for April 10 or 17th.    Her main comorbidity is her age.  No specific cardiac history or or symptoms.    Thank you for allowing [place] to care for Andrea [name]    I spent 60 minutes with Andrea [name] and their family, greater than 50% of which was spent in counseling and education reviewing their films, discussing surgical options and formulating a future care plan.

## 2018-12-31 NOTE — Progress Notes (Signed)
New Patient.  Cavitating Mass in LUL.      1. Have you been to the ER, urgent care clinic since your last visit?  Hospitalized since your last visit?No    2. Have you seen or consulted any other health care providers outside of the [facility] since your last visit?  Include any pap smears or colon screening. No

## 2019-01-01 NOTE — Telephone Encounter (Signed)
Left voicemail for patient regarding surgical information : DETAILS LEFT ; surgery letter sent through [facility] Chart

## 2019-01-01 NOTE — Telephone Encounter (Addendum)
Patient was contacted by Scheduler: advised that she would be put on 01/08/2019, but its contingent on getting PFTs and PATS completed with favorable results - patient aware that abnormal results may result in being rescheduled. Patient verbalizes understanding and thanks the office for accommodating her.      Please call patient - she wants to speak directly to Dr Glo Herring. Patient stated that she will go get a second opinion as " she now has another week to fret over her condition"; ** patient was advised that she would be scheduled for surgery for 04/17 as per the Dr, and that would give enough time to schedule PATS, and  PFTs and get results from both.

## 2019-01-05 ENCOUNTER — Inpatient Hospital Stay: Admit: 2019-01-05 | Payer: MEDICARE

## 2019-01-05 LAB — CBC
Hematocrit: 36.5 % (ref 35.0–47.0)
Hemoglobin: 12.2 g/dL (ref 11.5–16.0)
MCH: 32.4 PG (ref 26.0–34.0)
MCHC: 33.4 g/dL (ref 30.0–36.5)
MCV: 97.1 FL (ref 80.0–99.0)
MPV: 11.3 FL (ref 8.9–12.9)
NRBC Absolute: 0 10*3/uL (ref 0.00–0.01)
Nucleated RBCs: 0 PER 100 WBC
Platelets: 241 10*3/uL (ref 150–400)
RBC: 3.76 M/uL — ABNORMAL LOW (ref 3.80–5.20)
RDW: 11.9 % (ref 11.5–14.5)
WBC: 5.7 10*3/uL (ref 3.6–11.0)

## 2019-01-05 LAB — EKG 12-LEAD
Atrial Rate: 51 {beats}/min
P Axis: 49 degrees
P-R Interval: 178 ms
Q-T Interval: 496 ms
QRS Duration: 132 ms
QTc Calculation (Bazett): 457 ms
R Axis: 8 degrees
T Axis: 19 degrees
Ventricular Rate: 51 {beats}/min

## 2019-01-05 LAB — URINALYSIS W/ REFLEX CULTURE
BACTERIA, URINE: NEGATIVE /hpf
Bacteria: NEGATIVE /hpf
Bilirubin, Urine: NEGATIVE
Bilirubin: NEGATIVE
Blood, Urine: NEGATIVE
Blood: NEGATIVE
Glucose, Ur: NEGATIVE mg/dL
Glucose: NEGATIVE mg/dL
Ketone: NEGATIVE mg/dL
Ketones, Urine: NEGATIVE mg/dL
Leukocyte Esterase, Urine: NEGATIVE
Leukocyte Esterase: NEGATIVE
Nitrite, Urine: NEGATIVE
Nitrites: NEGATIVE
Protein, UA: NEGATIVE mg/dL
Protein: NEGATIVE mg/dL
Specific Gravity, UA: 1.009 (ref 1.003–1.030)
Specific gravity: 1.009 (ref 1.003–1.030)
Urobilinogen, UA, POCT: 0.2 EU/dL (ref 0.2–1.0)
Urobilinogen: 0.2 EU/dL (ref 0.2–1.0)
pH (UA): 7 (ref 5.0–8.0)
pH, UA: 7 (ref 5.0–8.0)

## 2019-01-05 LAB — BASIC METABOLIC PANEL
Anion Gap: 6 mmol/L (ref 5–15)
BUN: 10 MG/DL (ref 6–20)
Bun/Cre Ratio: 17 (ref 12–20)
CO2: 28 mmol/L (ref 21–32)
Calcium: 9 MG/DL (ref 8.5–10.1)
Chloride: 102 mmol/L (ref 97–108)
Creatinine: 0.58 MG/DL (ref 0.55–1.02)
EGFR IF NonAfrican American: >60 mL/min/{1.73_m2} (ref >60)
GFR African American: >60 mL/min/{1.73_m2} (ref >60)
Glucose: 102 mg/dL — ABNORMAL HIGH (ref 65–100)
Potassium: 4 mmol/L (ref 3.5–5.1)
Sodium: 136 mmol/L (ref 136–145)

## 2019-01-05 LAB — CBC W/O DIFF
ABSOLUTE NRBC: 0 10*3/uL (ref 0.00–0.01)
HCT: 36.5 % (ref 35.0–47.0)
HGB: 12.2 g/dL (ref 11.5–16.0)
MCH: 32.4 PG (ref 26.0–34.0)
MCHC: 33.4 g/dL (ref 30.0–36.5)
MCV: 97.1 FL (ref 80.0–99.0)
MPV: 11.3 FL (ref 8.9–12.9)
NRBC: 0 PER 100 WBC
PLATELET: 241 10*3/uL (ref 150–400)
RBC: 3.76 M/uL — ABNORMAL LOW (ref 3.80–5.20)
RDW: 11.9 % (ref 11.5–14.5)
WBC: 5.7 10*3/uL (ref 3.6–11.0)

## 2019-01-05 LAB — METABOLIC PANEL, BASIC
Anion gap: 6 mmol/L (ref 5–15)
BUN/Creatinine ratio: 17 (ref 12–20)
BUN: 10 MG/DL (ref 6–20)
CO2: 28 mmol/L (ref 21–32)
Calcium: 9 MG/DL (ref 8.5–10.1)
Chloride: 102 mmol/L (ref 97–108)
Creatinine: 0.58 MG/DL (ref 0.55–1.02)
GFR est AA: >60 mL/min/{1.73_m2} (ref >60)
GFR est non-AA: >60 mL/min/{1.73_m2} (ref >60)
Glucose: 102 mg/dL — ABNORMAL HIGH (ref 65–100)
Potassium: 4 mmol/L (ref 3.5–5.1)
Sodium: 136 mmol/L (ref 136–145)

## 2019-01-05 LAB — EKG, 12 LEAD, INITIAL
Atrial Rate: 51 {beats}/min
Calculated P Axis: 49 degrees
Calculated R Axis: 8 degrees
Calculated T Axis: 19 degrees
P-R Interval: 178 ms
Q-T Interval: 496 ms
QRS Duration: 132 ms
QTC Calculation (Bezet): 457 ms
Ventricular Rate: 51 {beats}/min

## 2019-01-05 NOTE — Procedures (Signed)
[place]  PULMONARY FUNCTION TEST    Name:  Andrea, [name]  MR#:  [id number]  DOB:  1946/07/29  ACCOUNT #:  [id number]  DATE OF SERVICE:  01/05/2019      CLINICAL INDICATION:  Preoperative evaluation.    Spirometry, lung volumes, and diffusion capacity were performed.  Spirometry is within normal limits.  However, the FEF 25%-75% is reduced out of proportion of vital capacity and FEV1, which may be seen in small airway disease.  Clinical correlation is recommended.  Lung volumes moderately reveal a mild reduction in total lung capacity, consistent with a restrictive effect; however, there is an element of air trapping which likely accounts for the reduction in total lung capacity.  The diffusion capacity is normal.      Cyndra Numbers, MD      [id number]  D:  01/08/2019 14:35  T:  01/08/2019 20:39  JOB #:  [id number]

## 2019-01-05 NOTE — Interval H&P Note (Signed)
PREOPERATIVE INSTRUCTIONS REVIEWED WITH PATIENT.  PATIENT GIVEN TWO-- BOTTLES OF CHG SOAPS  INSTRUCTIONS REVIEWED ON USE OF CHG SOAPS   PATIENT GIVEN SSI INFECTIONS SHEET.  PATIENT WAS GIVEN THE OPPORTUNITY TO ASK QUESTIONS ON THE INFORMATION PROVIDED.

## 2019-01-05 NOTE — Other (Signed)
PREOPERATIVE INSTRUCTIONS REVIEWED WITH PATIENT.  PATIENT GIVEN TWO-- BOTTLES OF CHG SOAPS  INSTRUCTIONS REVIEWED ON USE OF CHG SOAPS   PATIENT GIVEN SSI INFECTIONS SHEET.  PATIENT WAS GIVEN THE OPPORTUNITY TO ASK QUESTIONS ON THE INFORMATION PROVIDED.

## 2019-01-05 NOTE — Procedures (Signed)
[place]  PULMONARY FUNCTION TEST    Name:  Andrea [name], Andrea [name]  MR#:  [id number]  DOB:  1946/06/12  ACCOUNT #:  [id number]  DATE OF SERVICE:  01/05/2019      CLINICAL INDICATION:  Preoperative evaluation.    Spirometry, lung volumes, and diffusion capacity were performed.  Spirometry is within normal limits.  However, the FEF 25%-75% is reduced out of proportion of vital capacity and FEV1, which may be seen in small airway disease.  Clinical correlation is recommended.  Lung volumes moderately reveal a mild reduction in total lung capacity, consistent with a restrictive effect; however, there is an element of air trapping which likely accounts for the reduction in total lung capacity.  The diffusion capacity is normal.      Cyndra Numbers, MD      [id number]  D:  01/08/2019 14:35  T:  01/08/2019 20:39  JOB #:  [id number]

## 2019-01-06 NOTE — Telephone Encounter (Signed)
Patient called with questions regarding her upcoming Robotic Left Upper Lobectomy on 01/08/2019 with Dr. Glo Herring.    The patient asked how quickly she would be returning to regular activity?  6-8 weeks.    Are there any dietary restrictions once I go home?  I advised the patient only if she wants there to be.    She asked about a follow up appointment.  I advised her we would make her first one for 2 weeks after discharge.    The patient asked about her daughter visiting her after surgery.  I advised the patient that her would not be able to go into her room b/c of the COVID-19 issue going on.  They con "see" each other via FaceTime or another similar portal.    The patient verbalized understanding of all.    The patient had another (complicated) question for Dr. Glo Herring, which she will send via [place].

## 2019-01-07 NOTE — Telephone Encounter (Signed)
Returned Talbot Grumbling' call.  She is Dr. Tressie Ellis nurse (PCP).    She called regarding the patient's allergies to pain medications listed.  She stated that the patient called to have them removed from her chart.  Candy stated that there is no documentation stating that her husband or son had an issue with these drugs and that's why she had them on her list so that she would not have them prescribed so that they would not be in her house. (that's the reason she gave me as to why she is "allergic").    Candy stated that "Itching" is the reason for allergy at their office and at the pharmacy.    Candy will call the patient this morning to let the patient know that they will not take these off her allergy list.    I advised Candy that we can prescribe other pain medications after surgery such as Dilaudid and/or Ketorolac. We will evaluate at that time.    Candy verbalized understanding.

## 2019-01-08 ENCOUNTER — Inpatient Hospital Stay
Admit: 2019-01-08 | Discharge: 2019-01-10 | Disposition: A | Discharge status: Home / Self Care | Payer: MEDICARE | Attending: Thoracic Surgery (Cardiothoracic Vascular Surgery) | Admitting: Thoracic Surgery (Cardiothoracic Vascular Surgery)

## 2019-01-08 ENCOUNTER — Inpatient Hospital Stay: Admit: 2019-01-08 | Payer: MEDICARE

## 2019-01-08 DIAGNOSIS — C3412 Malignant neoplasm of upper lobe, left bronchus or lung: Secondary | ICD-10-CM

## 2019-01-08 LAB — TYPE AND SCREEN
ABO/Rh: O POS
Antibody Screen: NEGATIVE

## 2019-01-08 LAB — POCT GLUCOSE: POC Glucose: 118 mg/dL — ABNORMAL HIGH (ref 65–100)

## 2019-01-08 LAB — GLUCOSE, POC: Glucose (POC): 118 mg/dL — ABNORMAL HIGH (ref 65–100)

## 2019-01-08 LAB — TYPE & SCREEN
ABO/Rh(D): O POS
Antibody screen: NEGATIVE

## 2019-01-08 MED ORDER — EPHEDRINE (PF) 50 MG/5 ML (10 MG/ML) IN NS IV SYRINGE
50 mg/5 mL (10 mg/mL) | INTRAVENOUS | Status: AC
Start: 2019-01-08 — End: ?

## 2019-01-08 MED ORDER — SODIUM CHLORIDE 0.9 % IJ SYRG
INTRAMUSCULAR | Status: DC | PRN
Start: 2019-01-08 — End: 2019-01-08

## 2019-01-08 MED ORDER — GLYCOPYRROLATE 0.2 MG/ML IJ SOLN
0.2 mg/mL | INTRAMUSCULAR | Status: DC | PRN
Start: 2019-01-08 — End: 2019-01-08
  Administered 2019-01-08: 14:00:00 via INTRAVENOUS

## 2019-01-08 MED ORDER — FENTANYL CITRATE (PF) 50 MCG/ML IJ SOLN
50 mcg/mL | INTRAMUSCULAR | Status: DC | PRN
Start: 2019-01-08 — End: 2019-01-08
  Administered 2019-01-08 (×3): via INTRAVENOUS

## 2019-01-08 MED ORDER — HYDROCHLOROTHIAZIDE 25 MG TAB
25 mg | Freq: Every evening | ORAL | Status: DC
Start: 2019-01-08 — End: 2019-01-10
  Administered 2019-01-09 – 2019-01-10 (×2): via ORAL

## 2019-01-08 MED ORDER — PANTOPRAZOLE 20 MG TAB, DELAYED RELEASE
20 mg | Freq: Every day | ORAL | Status: DC
Start: 2019-01-08 — End: 2019-01-10
  Administered 2019-01-09 – 2019-01-10 (×2): via ORAL

## 2019-01-08 MED ORDER — SODIUM CHLORIDE 0.9 % IJ SYRG
INTRAMUSCULAR | Status: DC | PRN
Start: 2019-01-08 — End: 2019-01-10

## 2019-01-08 MED ORDER — CEFAZOLIN 2 GRAM/20 ML IN STERILE WATER INTRAVENOUS SYRINGE
2 gram/0 mL | INTRAVENOUS | Status: AC
Start: 2019-01-08 — End: ?

## 2019-01-08 MED ORDER — NEOSTIGMINE METHYLSULFATE 3 MG/3 ML (1 MG/ML) IV SYRINGE
3 mg/ mL (1 mg/mL) | INTRAVENOUS | Status: DC | PRN
Start: 2019-01-08 — End: 2019-01-08
  Administered 2019-01-08: 14:00:00 via INTRAVENOUS

## 2019-01-08 MED ORDER — SODIUM CHLORIDE 0.9 % IV
INTRAVENOUS | Status: DC
Start: 2019-01-08 — End: 2019-01-08

## 2019-01-08 MED ORDER — PROPOFOL 10 MG/ML IV EMUL
10 mg/mL | INTRAVENOUS | Status: DC | PRN
Start: 2019-01-08 — End: 2019-01-08
  Administered 2019-01-08 (×2): via INTRAVENOUS

## 2019-01-08 MED ORDER — KETOROLAC TROMETHAMINE 30 MG/ML INJECTION
30 mg/mL (1 mL) | Freq: Four times a day (QID) | INTRAMUSCULAR | Status: DC
Start: 2019-01-08 — End: 2019-01-10
  Administered 2019-01-08 – 2019-01-10 (×3): via INTRAVENOUS

## 2019-01-08 MED ORDER — FENTANYL CITRATE (PF) 50 MCG/ML IJ SOLN
50 mcg/mL | INTRAMUSCULAR | Status: DC | PRN
Start: 2019-01-08 — End: 2019-01-08

## 2019-01-08 MED ORDER — DEXAMETHASONE SODIUM PHOSPHATE 4 MG/ML IJ SOLN
4 mg/mL | INTRAMUSCULAR | Status: DC | PRN
Start: 2019-01-08 — End: 2019-01-08
  Administered 2019-01-08: 12:00:00 via INTRAVENOUS

## 2019-01-08 MED ORDER — SODIUM CHLORIDE 0.9 % IV
10 mg/mL | INTRAVENOUS | Status: DC | PRN
Start: 2019-01-08 — End: 2019-01-08
  Administered 2019-01-08 (×5): via INTRAVENOUS

## 2019-01-08 MED ORDER — HYDROMORPHONE (PF) 1 MG/ML IJ SOLN
1 mg/mL | INTRAMUSCULAR | Status: DC | PRN
Start: 2019-01-08 — End: 2019-01-10
  Administered 2019-01-08 – 2019-01-10 (×5): via INTRAVENOUS

## 2019-01-08 MED ORDER — SODIUM CHLORIDE 0.9 % IJ SYRG
Freq: Three times a day (TID) | INTRAMUSCULAR | Status: DC
Start: 2019-01-08 — End: 2019-01-10
  Administered 2019-01-08 – 2019-01-10 (×6): via INTRAVENOUS

## 2019-01-08 MED ORDER — ONDANSETRON (PF) 4 MG/2 ML INJECTION
4 mg/2 mL | INTRAMUSCULAR | Status: DC | PRN
Start: 2019-01-08 — End: 2019-01-08
  Administered 2019-01-08: 14:00:00 via INTRAVENOUS

## 2019-01-08 MED ORDER — MIDAZOLAM 1 MG/ML IJ SOLN
1 mg/mL | INTRAMUSCULAR | Status: AC
Start: 2019-01-08 — End: ?

## 2019-01-08 MED ORDER — SENNOSIDES-DOCUSATE SODIUM 8.6 MG-50 MG TAB
Freq: Every day | ORAL | Status: DC
Start: 2019-01-08 — End: 2019-01-10
  Administered 2019-01-09 – 2019-01-10 (×2): via ORAL

## 2019-01-08 MED ORDER — PHENYLEPHRINE IN 0.9 % SODIUM CL (40 MCG/ML) IV SYRINGE
0.4 mg/10 mL (40 mcg/mL) | INTRAVENOUS | Status: DC | PRN
Start: 2019-01-08 — End: 2019-01-08
  Administered 2019-01-08 (×9): via INTRAVENOUS

## 2019-01-08 MED ORDER — LACTATED RINGERS IV
INTRAVENOUS | Status: DC
Start: 2019-01-08 — End: 2019-01-08
  Administered 2019-01-08: 15:00:00 via INTRAVENOUS

## 2019-01-08 MED ORDER — PREDNISOLONE ACETATE 1 % EYE DROPS, SUSP
1 % | Freq: Every day | OPHTHALMIC | Status: DC
Start: 2019-01-08 — End: 2019-01-10
  Administered 2019-01-09 – 2019-01-10 (×2): via OPHTHALMIC

## 2019-01-08 MED ORDER — EPHEDRINE (PF) 50 MG/5 ML (10 MG/ML) IN NS IV SYRINGE
50 mg/5 mL (10 mg/mL) | INTRAVENOUS | Status: DC | PRN
Start: 2019-01-08 — End: 2019-01-08
  Administered 2019-01-08 (×4): via INTRAVENOUS

## 2019-01-08 MED ORDER — SODIUM CHLORIDE 0.9 % IJ SYRG
Freq: Three times a day (TID) | INTRAMUSCULAR | Status: DC
Start: 2019-01-08 — End: 2019-01-08

## 2019-01-08 MED ORDER — LIDOCAINE (PF) 20 MG/ML (2 %) IJ SOLN
20 mg/mL (2 %) | INTRAMUSCULAR | Status: DC | PRN
Start: 2019-01-08 — End: 2019-01-08
  Administered 2019-01-08: 12:00:00 via INTRAVENOUS

## 2019-01-08 MED ORDER — KETOROLAC TROMETHAMINE 30 MG/ML INJECTION
30 mg/mL (1 mL) | INTRAMUSCULAR | Status: AC
Start: 2019-01-08 — End: 2019-01-08
  Administered 2019-01-08: 18:00:00 via INTRAVENOUS

## 2019-01-08 MED ORDER — FENTANYL CITRATE (PF) 50 MCG/ML IJ SOLN
50 mcg/mL | INTRAMUSCULAR | Status: DC | PRN
Start: 2019-01-08 — End: 2019-01-08
  Administered 2019-01-08 (×3): via INTRAVENOUS

## 2019-01-08 MED ORDER — SODIUM CHLORIDE 0.9 % IJ SYRG
Freq: Three times a day (TID) | INTRAMUSCULAR | Status: DC
Start: 2019-01-08 — End: 2019-01-08
  Administered 2019-01-08: 10:00:00 via INTRAVENOUS

## 2019-01-08 MED ORDER — FENTANYL CITRATE (PF) 50 MCG/ML IJ SOLN
50 mcg/mL | INTRAMUSCULAR | Status: AC
Start: 2019-01-08 — End: ?

## 2019-01-08 MED ORDER — LISINOPRIL 20 MG TAB
20 mg | Freq: Every evening | ORAL | Status: DC
Start: 2019-01-08 — End: 2019-01-10
  Administered 2019-01-09 – 2019-01-10 (×2): via ORAL

## 2019-01-08 MED ORDER — ONDANSETRON (PF) 4 MG/2 ML INJECTION
4 mg/2 mL | INTRAMUSCULAR | Status: DC | PRN
Start: 2019-01-08 — End: 2019-01-08

## 2019-01-08 MED ORDER — ROCURONIUM 10 MG/ML IV
10 mg/mL | INTRAVENOUS | Status: DC | PRN
Start: 2019-01-08 — End: 2019-01-08
  Administered 2019-01-08 (×6): via INTRAVENOUS

## 2019-01-08 MED ORDER — LIDOCAINE (PF) 10 MG/ML (1 %) IJ SOLN
10 mg/mL (1 %) | INTRAMUSCULAR | Status: DC | PRN
Start: 2019-01-08 — End: 2019-01-08

## 2019-01-08 MED ORDER — MIDAZOLAM 1 MG/ML IJ SOLN
1 mg/mL | INTRAMUSCULAR | Status: DC | PRN
Start: 2019-01-08 — End: 2019-01-08

## 2019-01-08 MED ORDER — BUPIVACAINE (PF) 0.25 % (2.5 MG/ML) IJ SOLN
0.25 % (2.5 mg/mL) | INTRAMUSCULAR | Status: DC | PRN
Start: 2019-01-08 — End: 2019-01-08
  Administered 2019-01-08: 14:00:00 via SUBCUTANEOUS

## 2019-01-08 MED ORDER — CEFAZOLIN 1 GRAM SOLUTION FOR INJECTION
1 gram | INTRAMUSCULAR | Status: DC | PRN
Start: 2019-01-08 — End: 2019-01-08
  Administered 2019-01-08: 12:00:00 via INTRAVENOUS

## 2019-01-08 MED ORDER — SUCCINYLCHOLINE CHLORIDE 20 MG/ML INJECTION
20 mg/mL | INTRAMUSCULAR | Status: DC | PRN
Start: 2019-01-08 — End: 2019-01-08
  Administered 2019-01-08: 12:00:00 via INTRAVENOUS

## 2019-01-08 MED ORDER — LACTATED RINGERS IV
INTRAVENOUS | Status: DC
Start: 2019-01-08 — End: 2019-01-08

## 2019-01-08 MED ORDER — HYDRALAZINE 50 MG TAB
50 mg | Freq: Two times a day (BID) | ORAL | Status: DC
Start: 2019-01-08 — End: 2019-01-10
  Administered 2019-01-09 – 2019-01-10 (×3): via ORAL

## 2019-01-08 MED ORDER — HYDROMORPHONE (PF) 2 MG/ML IJ SOLN
2 mg/mL | INTRAMUSCULAR | Status: AC
Start: 2019-01-08 — End: ?

## 2019-01-08 MED ORDER — LACTATED RINGERS IV
INTRAVENOUS | Status: DC | PRN
Start: 2019-01-08 — End: 2019-01-08
  Administered 2019-01-08: 11:00:00 via INTRAVENOUS

## 2019-01-08 MED ORDER — MIDAZOLAM 1 MG/ML IJ SOLN
1 mg/mL | INTRAMUSCULAR | Status: DC | PRN
Start: 2019-01-08 — End: 2019-01-08
  Administered 2019-01-08: 11:00:00 via INTRAVENOUS

## 2019-01-08 MED ORDER — VENLAFAXINE SR 37.5 MG 24 HR CAP
37.5 mg | Freq: Every evening | ORAL | Status: DC
Start: 2019-01-08 — End: 2019-01-10
  Administered 2019-01-09 – 2019-01-10 (×2): via ORAL

## 2019-01-08 MED ORDER — LORAZEPAM 0.5 MG TAB
0.5 mg | Freq: Every day | ORAL | Status: DC | PRN
Start: 2019-01-08 — End: 2019-01-10
  Administered 2019-01-09: 03:00:00 via ORAL

## 2019-01-08 MED ORDER — HYDROCODONE-ACETAMINOPHEN 5 MG-325 MG TAB
5-325 mg | ORAL | Status: DC | PRN
Start: 2019-01-08 — End: 2019-01-08

## 2019-01-08 MED ORDER — DIPHENHYDRAMINE HCL 50 MG/ML IJ SOLN
50 mg/mL | INTRAMUSCULAR | Status: DC | PRN
Start: 2019-01-08 — End: 2019-01-08

## 2019-01-08 MED ORDER — MORPHINE 10 MG/ML INJ SOLUTION
10 mg/ml | INTRAMUSCULAR | Status: DC | PRN
Start: 2019-01-08 — End: 2019-01-08

## 2019-01-08 MED ORDER — HYDROMORPHONE (PF) 1 MG/ML IJ SOLN
1 mg/mL | INTRAMUSCULAR | Status: DC | PRN
Start: 2019-01-08 — End: 2019-01-08

## 2019-01-08 MED ORDER — NALOXONE 0.4 MG/ML INJECTION
0.4 mg/mL | INTRAMUSCULAR | Status: DC | PRN
Start: 2019-01-08 — End: 2019-01-10

## 2019-01-08 MED ORDER — ONDANSETRON (PF) 4 MG/2 ML INJECTION
4 mg/2 mL | INTRAMUSCULAR | Status: DC | PRN
Start: 2019-01-08 — End: 2019-01-10

## 2019-01-08 MED ORDER — ENOXAPARIN 40 MG/0.4 ML SUB-Q SYRINGE
40 mg/0.4 mL | SUBCUTANEOUS | Status: DC
Start: 2019-01-08 — End: 2019-01-10
  Administered 2019-01-09: 14:00:00 via SUBCUTANEOUS

## 2019-01-08 MED ORDER — ATENOLOL 25 MG TAB
25 mg | Freq: Every evening | ORAL | Status: DC
Start: 2019-01-08 — End: 2019-01-10
  Administered 2019-01-09 – 2019-01-10 (×2): via ORAL

## 2019-01-08 MED ORDER — SODIUM CHLORIDE 0.9 % IV
INTRAVENOUS | Status: DC
Start: 2019-01-08 — End: 2019-01-08
  Administered 2019-01-08: 15:00:00 via INTRAVENOUS

## 2019-01-08 MED ORDER — OXYCODONE-ACETAMINOPHEN 5 MG-325 MG TAB
5-325 mg | ORAL | Status: DC | PRN
Start: 2019-01-08 — End: 2019-01-10
  Administered 2019-01-08 – 2019-01-09 (×2): via ORAL

## 2019-01-08 MED FILL — KETOROLAC TROMETHAMINE 30 MG/ML INJECTION: 30 mg/mL (1 mL) | INTRAMUSCULAR | Qty: 1

## 2019-01-08 MED FILL — FENTANYL CITRATE (PF) 50 MCG/ML IJ SOLN: 50 mcg/mL | INTRAMUSCULAR | Qty: 2

## 2019-01-08 MED FILL — NORMAL SALINE FLUSH 0.9 % INJECTION SYRINGE: INTRAMUSCULAR | Qty: 10

## 2019-01-08 MED FILL — MIDAZOLAM 1 MG/ML IJ SOLN: 1 mg/mL | INTRAMUSCULAR | Qty: 2

## 2019-01-08 MED FILL — CEFAZOLIN 2 GRAM/20 ML IN STERILE WATER INTRAVENOUS SYRINGE: 2 gram/0 mL | INTRAVENOUS | Qty: 20

## 2019-01-08 MED FILL — EPHEDRINE 50 MG/5 ML (10 MG/ML) IN NS IV SYRINGE: 50 mg/5 mL (10 mg/mL) | INTRAVENOUS | Qty: 5

## 2019-01-08 MED FILL — HYDRALAZINE 50 MG TAB: 50 mg | ORAL | Qty: 1

## 2019-01-08 MED FILL — LACTATED RINGERS IV: INTRAVENOUS | Qty: 1000

## 2019-01-08 MED FILL — PRED FORTE 1 % EYE DROPS,SUSPENSION: 1 % | OPHTHALMIC | Qty: 5

## 2019-01-08 MED FILL — SODIUM CHLORIDE 0.9 % IV: INTRAVENOUS | Qty: 1000

## 2019-01-08 MED FILL — HYDROMORPHONE (PF) 2 MG/ML IJ SOLN: 2 mg/mL | INTRAMUSCULAR | Qty: 1

## 2019-01-08 MED FILL — HYDROMORPHONE (PF) 1 MG/ML IJ SOLN: 1 mg/mL | INTRAMUSCULAR | Qty: 1

## 2019-01-08 MED FILL — OXYCODONE-ACETAMINOPHEN 5 MG-325 MG TAB: 5-325 mg | ORAL | Qty: 2

## 2019-01-08 NOTE — Progress Notes (Signed)
TRANSFER - IN REPORT:    Verbal report received from (name) on KASSIDEY SUNDAHL  being received from PACU(unit) for routine progression of care      Report consisted of patient's Situation, Background, Assessment and   Recommendations(SBAR).     Information from the following report(s) SBAR, Kardex, Intake/Output, MAR, Recent Results, Med Rec Status and Cardiac Rhythm NSR was reviewed with the receiving nurse.    Opportunity for questions and clarification was provided.      Assessment completed upon patient's arrival to unit and care assumed.       1225 Pt arrived on unit. Telemetry placed and confirmed with monitor tech. VSS.     1550 Bedside and Verbal shift change report given to Dahlia Client (Cabin crew) by Rochel Brome (offgoing nurse). Report included the following information SBAR, Kardex, Intake/Output, MAR, Recent Results, Med Rec Status and Cardiac Rhythm NSR c BBB.

## 2019-01-08 NOTE — Progress Notes (Signed)
Problem: Falls - Risk of  Goal: *Absence of Falls  Description: Document Andrea [name] Fall Risk and appropriate interventions in the flowsheet.  Outcome: Progressing Towards Goal  Note: Fall Risk Interventions:            Medication Interventions: Evaluate medications/consider consulting pharmacy, Patient to call before getting OOB    Elimination Interventions: Elevated toilet seat, Call light in reach, Patient to call for help with toileting needs

## 2019-01-08 NOTE — Progress Notes (Signed)
2000 Report received from [facility].

## 2019-01-08 NOTE — Progress Notes (Signed)
1600 Bedside and Verbal shift change report given to Hannah,RN (oncoming nurse) by Inocencio Homes (offgoing nurse). Report included the following information SBAR, Kardex, Intake/Output, MAR and Cardiac Rhythm NSR;BBB. Patient resting in bed comfortably. No complaints of pain. CT measured at 70.     1930 Bedside and Verbal shift change report given to Rhonda,RN (Soil scientist) by Hulan Fess (offgoing nurse). Report included the following information SBAR, Kardex, Intake/Output, MAR and Cardiac Rhythm NSR;BBB.

## 2019-01-08 NOTE — Interval H&P Note (Signed)
 TRANSFER - OUT REPORT:    Verbal report given to [facility] RN(name) on [address]  being transferred to CVSU(unit) for routine post - op       Report consisted of patient's Situation, Background, Assessment and   Recommendations(SBAR).     Time Pre op antibiotic given:Y  Anesthesia Stop time: Y  Foley Present on Transfer to floor:N  Order for Foley on Chart:N  Discharge Prescriptions with Chart:N    Information from the following report(s) SBAR was reviewed with the receiving nurse.    Opportunity for questions and clarification was provided.     Is the patient on 02? YES       L/Min 2       Other     Is the patient on a monitor? YES    Is the nurse transporting with the patient? YES    Surgical Waiting Area notified of patient's transfer from PACU? YES      The following personal items collected during your admission accompanied patient upon transfer:   Dental Appliance:    Vision:    Hearing Aid:    Jewelry:    Clothing: Clothing: (clothing to PACU')  Other Valuables: Other Valuables: Cell Phone(in pt suitcase)  Valuables sent to safe:

## 2019-01-08 NOTE — H&P (Signed)
Asked to see [name] re: LUL mass and hemoptysis  ??  Referred by Dr. Randol Kern  ??  [name] is a pleasant 73 y/o lady with a past medical history significant for HTN and GERD.  She is referred for surgical workup and possible resection of a LUL mass.  She reports that in 2010 she had a LUL nodule incidentally discovered.  This was followed and in 2015 it had grown and she was seen by a Thoracic surgeon who presented her case to tumor board and biopsy/resection was recommended.  Ultimately she decided against surgery at that time.  ??  She now presents with hemoptysis.  She has been seen by Oncology and has had repeat Chest CT and PET scan.  She coughs up blood tinged sputum almost every night.  She denies pleuritic chest pain.  ??  She and her family describe her as active, able to walk more than a mile and she works outside.  ??        Allergies   Allergen Reactions   ??? Norco [Hydrocodone-Acetaminophen] Other (comments)   ?? ?? "loopy feeling"   ??? Percocet [Oxycodone-Acetaminophen] Other (comments)   ?? ?? "feeling loopy"   ??  ??  PMHx: HTN, GERD  ??  PSHx: tonsillectomy, cholecystectomy  ??  SocHx:  Former smoker  ??        Family History   Problem Relation Age of Onset   ??? Stroke Mother ??   ??? Hypertension Mother ??   ??? Stroke Father ??   ??? Arthritis-osteo Brother ??   ??? Cancer Brother ??   ??? Heart Disease Brother ??   ??? Kidney Disease Brother ??   ??  ??         Outpatient Medications Marked as Taking for the 12/31/18 encounter (Office Visit) with Wardell Honour, MD   Medication Sig Dispense Refill   ??? lisinopriL (PRINIVIL, ZESTRIL) 20 mg tablet Take 20 mg by mouth daily. ?? ??   ??? hydroCHLOROthiazide (HYDRODIURIL) 25 mg tablet Take 25 mg by mouth daily. ?? ??   ??? omeprazole (PRILOSEC) 10 mg capsule Take 10 mg by mouth daily. ?? ??   ??? prednisoLONE acetate (PRED FORTE) 1 % ophthalmic suspension Administer 1 Drop to right eye daily. ?? ??   ??? LORazepam (ATIVAN) 0.5 mg tablet Take 1 Tab by mouth daily as  needed for Anxiety. 30 Tab 3   ??? venlafaxine-SR (EFFEXOR-XR) 37.5 mg capsule Take 1 Cap by mouth daily. 30 Cap 3   ??? hydrALAZINE (APRESOLINE) 50 mg tablet Take 50 mg by mouth daily. ?? ??   ??? atenolol (TENORMIN) 25 mg tablet Take 25 mg by mouth daily. ?? ??   ??  ??  ROS:  ??  Constitutional- denies weight loss or gain  HEENT- denies dysphagia  Neuro- denies syncope  Optho- no recent changes in vision  Resp- endorses hemoptysis  CV- denies angina or palpitations  GI- denies abd pain  GU- no complaints  ID- denies recent fevers  Vasc- denies claudication  ??  Blood pressure 145/73, pulse (!) 59, resp. rate 18, height 5\' 2"  (1.575 m), weight 150 lb (68 kg), SpO2 97 %.  ??  On exam she is seated upright  Alert and oriented  Well developed  Appears her stated age  Not tachypneic  Normal speech, normal affect  No cervical or supraclavicular lymphadenopathy  Lungs CTA b/l  No chest wall tenderness  Rrr, no  murmurs  Radial pulses palp b/l  abd sntnd, no organomegaly  LE non edematous  ??  ==============================  ??  Cr 0.57  ??  WBC 5.2  Hgb 12.2  Plts 248  ==============================  ??  I have personally reviewed her Chest CT and PET scan.  She has a 4.3cm spiculated cavitary lesion LUL medially and apically.  This mass abuts the aortic arch and the fat plane is not always visible.  There is no definitive lymphadenopathy.  The mass is PET avid, no hilar or mediastinal PET avid LNs.  There is a PET avid rectal lesion.   ??  ==============================  ??  PFTs- FeV1 >100%  DLCO >100%  ??  ==============================  ??  Diagnoses  1: LUL lung cancer  2: Hemoptysis  ??  =============================  ??  Mrs. Pourciau has a symptomatic LUL mass that has enlarged over the past ~ten years.  It is now intensely PET avid and abuts the aortic arch. Given her smoking history this should be considered a malignancy until proven otherwise. It does appear localized except for a small rectal lesion on PET that clinically is not  related.  ??  I have reviewed her case with Dr. Orvan Seen and I have reviewed her previous Thoracic notes from Dr. Ria Comment.  Because this is a symptomatic lesion with hemoptysis we should make every effort to resect it.  I discussed robotic left upper lobectomy with her and her daughter.  The only question is how "stuck" to the aortic arch this lesion will be.  That can only be determined intraoperatively.  ??  She sees GI tomorrow  ??  Will post for April 10 or 17th.  ??  Her main comorbidity is her age.  No specific cardiac history or or symptoms.  ??  Thank you for allowing [place] to care for [name]

## 2019-01-08 NOTE — Op Note (Signed)
Brief Postoperative Note    Patient: Andrea [name]  Date of Birth: March 24, 1946  MRN: [id number]    Date of Procedure: 01/08/2019     Pre-Op Diagnosis: LUL mass WITH HEMOPTYSIS    Post-Op Diagnosis: Same as preoperative diagnosis.      Procedure(s):  ROBOTIC LEFT UPPER LOBECTOMY    Surgeon(s):  Wardell Honour, MD    Surgical Assistant: Physician Assistant: [name], PA    Anesthesia: General     Estimated Blood Loss (mL): less than 50     Complications: None    Specimens:   ID Type Source Tests Collected by Time Destination   1 : 9 L lymph node Fresh Lymph Node  Wardell Honour, MD 01/08/2019 [phone] Pathology   2 : 10 L lymph node Fresh Lymph Node  Wardell Honour, MD 01/08/2019 [phone] Pathology   3 : Station 7 lymph node Fresh Lymph Node  Wardell Honour, MD 01/08/2019 [phone] Pathology   4 : Station 5 lymph node Fresh Lymph Node  Wardell Honour, MD 01/08/2019 [phone] Pathology   5 : 11 L lymph node Fresh Lymph Node  Wardell Honour, MD 01/08/2019 0850 Pathology   6 : 12 L lymph node Fresh Lymph Node  Wardell Honour, MD 01/08/2019 [phone] Pathology   7 : Left Upper Lobe Lung Fresh Lung, Upper Lobe  Wardell Honour, MD 01/08/2019 [phone] Pathology        Implants: * No implants in log *    Drains: * No LDAs found *    Findings: mass contained within specimen    Condition: stable    Disposition: to pacu    Electronically Signed by Wardell Honour, MD on 01/08/2019 at 10:07 AM

## 2019-01-08 NOTE — Anesthesia Post-Procedure Evaluation (Signed)
Procedure(s):  ROBOTIC LEFT UPPER LOBECTOMY.    general    <BSHSIANPOST>    Vitals Value Taken Time   BP 99/38 01/08/2019 11:40 AM   Temp 36.1 ??C (96.9 ??F) 01/08/2019 11:28 AM   Pulse 86 01/08/2019 11:44 AM   Resp 23 01/08/2019 11:44 AM   SpO2 98 % 01/08/2019 11:44 AM   Vitals shown include unvalidated device data.

## 2019-01-08 NOTE — Interval H&P Note (Signed)
Daughter Judson Roch called and informed of patient's progress.

## 2019-01-08 NOTE — Progress Notes (Signed)
Problem: Falls - Risk of  Goal: *Absence of Falls  Description: Document Andrea [name] Fall Risk and appropriate interventions in the flowsheet.  Outcome: Progressing Towards Goal  Note: Fall Risk Interventions:            Medication Interventions: Evaluate medications/consider consulting pharmacy, Patient to call before getting OOB    Elimination Interventions: Elevated toilet seat, Call light in reach, Patient to call for help with toileting needs              Problem: Pressure Injury - Risk of  Goal: *Prevention of pressure injury  Description: Document Braden Scale and appropriate interventions in the flowsheet.  Outcome: Progressing Towards Goal  Note: Pressure Injury Interventions:

## 2019-01-08 NOTE — Anesthesia Pre-Procedure Evaluation (Signed)
Relevant Problems   No relevant active problems       Anesthetic History   No history of anesthetic complications            Review of Systems / Medical History  Patient summary reviewed, nursing notes reviewed and pertinent labs reviewed    Pulmonary  Within defined limits                 Neuro/Psych   Within defined limits           Cardiovascular  Within defined limits  Hypertension                   GI/Hepatic/Renal  Within defined limits   GERD           Endo/Other  Within defined limits           Other Findings              Physical Exam    Airway  Mallampati: II  TM Distance: > 6 cm  Neck ROM: normal range of motion   Mouth opening: Normal     Cardiovascular  Regular rate and rhythm,  S1 and S2 normal,  no murmur, click, rub, or gallop             Dental  No notable dental hx       Pulmonary  Breath sounds clear to auscultation               Abdominal  GI exam deferred       Other Findings            Anesthetic Plan    ASA: 3  Anesthesia type: general          Induction: Intravenous  Anesthetic plan and risks discussed with: Patient

## 2019-01-08 NOTE — Op Note (Signed)
[facility]  OPERATIVE REPORT    Name:  Andrea [name], Andrea [name]  MR#:  [id number]  DOB:  [birth year]  ACCOUNT #:  [id number]  DATE OF SERVICE:  01/08/2019      CLINICAL SERVICE:  Thoracic Surgery.    ATTENDING SURGEON:  Wardell Honour, MD    OPERATION PERFORMED:  Robotic left upper lobectomy Editor, commissioning [place]).    PREOPERATIVE DIAGNOSES:  1.  Left upper lobe mass.  2.  Hemoptysis.    POSTOPERATIVE DIAGNOSES:  1.  Left upper lobe mass.  2.  Hemoptysis.    FIRST ASSISTANT:  Marcia Brash, PA    SPECIMENS SENT:  1.  Lymph nodes from stations 5, 7, 9L, 10L, 11L, 12L were sent to anatomic pathology.  2.  Left upper lobe was sent to anatomic pathology.    DRAINS AND TUBES:  One 28-French chest tube was left within the left hemithorax.    ANESTHESIA:  General with double-lumen endotracheal intubation.    ESTIMATED BLOOD LOSS:  For this case was 50 mL.    COMPLICATIONS:  None.    INDICATIONS FOR PROCEDURE:  The patient is a 73 year old lady who for several years has had a known left upper lobe nodule.  It was initially evaluated by a thoracic surgeon in 2015 but intervention was declined at that time.  She now presents with an enlarging mass with left upper lobe hemoptysis.  She was worked up by Oncology which revealed the mass was hypermetabolic on PET scan.  She was seen by Thoracic Surgery and after careful discussion with she and her family, decision was made to proceed to the operating room for resection.  PFTs were adequate.    PROCEDURE IN DETAIL:  After informed consent was obtained and placed on the chart, the patient was taken to the operating room and placed supine on the operating table.  General anesthesia with double-lumen endotracheal intubation was induced without complication.  Preoperative antibiotics were administered.  The patient was then placed in the right lateral decubitus position with the left side up.  The patient's left chest was prepped and draped in sterile fashion.  Time-out  was performed.    Through the eighth intercostal space as far anterior as possible, a 12-mm robotic trocar was placed.  The chest was insufflated.  In the same intercostal space more posteriorly, two 8-mm trocars and an additional 12 were placed.  Inferiorly, an assist port was placed.    The robot was then docked without complication.  Instruments were inserted under direct visualization.    The inferior pulmonary ligament was released.  Station 9L lymph node was sent to anatomic pathology.  Posterior pleura was then released.  Station 7 lymph node was dissected out and sent to anatomic pathology.  Station 10L lymph node was dissected out and sent to anatomic pathology.  The proximal portion of the pulmonary artery was exposed and dissected out.  The AP window was then dissected out.  Station 5 lymph node was sent to anatomic pathology.  The mass which was medial on the left upper lobe was not adherent to the aortic arch.    The fissure was then inspected.  The patient had a fairly complete fissure.  The pulmonary artery was dissected out in the fissure.  Multiple station 11L lymph nodes were dissected out and sent to anatomic pathology.  The lingular artery branch was dissected out and divided using Biochemist, clinical.  The apical posterior branches  of the pulmonary artery to the left upper lobe were dissected out and divided using Biochemist, clinical.    The anterior fissure was then completed.  The lobe was then retracted from anterior to posterior.  The superior pulmonary veins were then dissected out and divided using Biochemist, clinical.  Station 12L lymph nodes were dissected out and sent to anatomic pathology.  The remaining branches of the pulmonary artery to the left upper lobe were identified, dissected out and divided using Biochemist, clinical.  This left the left upper lobe bronchus isolated.  This was test occluded and then divided using da Vinci green load  stapler.    Hemostasis was ensured throughout the hemithorax.  Specimen bag was introduced through the assist port and the lobe was extirpated through the specimen bag.  The mass was confirmed to be within the lobe.  This was sent to anatomic pathology.    The instruments were withdrawn and the robot was undocked.  The port sites were inspected and found to be hemostatic.    Approximately 60 mL of 1% lidocaine mixed with 0.25% Marcaine was used as local anesthetic to perform intercostal nerve blocks.  A 28-French chest tube was placed posteriorly.  The lung was re-inflated under direct visualization.    The incisions were then closed in a multilayered fashion and covered with Dermabond.    The patient was then reversed from general anesthesia, extubated and taken to PACU in stable condition.    All surgical counts were correct x2 at the end of this case.    There were no immediate complications identified during this case.    Dr. Virgina Organ was present and scrubbed throughout the entire procedure.    PA, [name], was instrumental in completion of this operation as she assisted with opening and closing of all incisions and was the primary bedside assist during the da Vinci portion of this case.        Wardell Honour, MD      [id number]  D:  01/08/2019 13:00  T:  01/08/2019 13:57  JOB #:  [id number]  CC:  Randol Kern, MD

## 2019-01-08 NOTE — Anesthesia Pre-Procedure Evaluation (Addendum)
Relevant Problems   No relevant active problems       Anesthetic History   No history of anesthetic complications            Review of Systems / Medical History  Patient summary reviewed, nursing notes reviewed and pertinent labs reviewed    Pulmonary  Within defined limits                 Neuro/Psych   Within defined limits           Cardiovascular  Within defined limits  Hypertension                   GI/Hepatic/Renal  Within defined limits   GERD           Endo/Other  Within defined limits           Other Findings              Physical Exam    Airway  Mallampati: II  TM Distance: > 6 cm  Neck ROM: normal range of motion   Mouth opening: Normal     Cardiovascular  Regular rate and rhythm,  S1 and S2 normal,  no murmur, click, rub, or gallop             Dental  No notable dental hx       Pulmonary  Breath sounds clear to auscultation               Abdominal  GI exam deferred       Other Findings            Anesthetic Plan    ASA: 3  Anesthesia type: general          Induction: Intravenous  Anesthetic plan and risks discussed with: Patient

## 2019-01-08 NOTE — Brief Op Note (Signed)
Brief Postoperative Note    Patient: Andrea [name]  Date of Birth: [birth year]  MRN: [id number]    Date of Procedure: 01/08/2019     Pre-Op Diagnosis: LUL mass WITH HEMOPTYSIS    Post-Op Diagnosis: Same as preoperative diagnosis.      Procedure(s):  ROBOTIC LEFT UPPER LOBECTOMY    Surgeon(s):  Wardell Honour, MD    Surgical Assistant: Physician Assistant: [name], PA    Anesthesia: General     Estimated Blood Loss (mL): less than 50     Complications: None    Specimens:   ID Type Source Tests Collected by Time Destination   1 : 9 L lymph node Fresh Lymph Node  Wardell Honour, MD 01/08/2019 [phone] Pathology   2 : 10 L lymph node Fresh Lymph Node  Wardell Honour, MD 01/08/2019 [phone] Pathology   3 : Station 7 lymph node Fresh Lymph Node  Wardell Honour, MD 01/08/2019 [phone] Pathology   4 : Station 5 lymph node Fresh Lymph Node  Wardell Honour, MD 01/08/2019 [phone] Pathology   5 : 11 L lymph node Fresh Lymph Node  Wardell Honour, MD 01/08/2019 0850 Pathology   6 : 12 L lymph node Fresh Lymph Node  Wardell Honour, MD 01/08/2019 [phone] Pathology   7 : Left Upper Lobe Lung Fresh Lung, Upper Lobe  Wardell Honour, MD 01/08/2019 [phone] Pathology        Implants: * No implants in log *    Drains: * No LDAs found *    Findings: mass contained within specimen    Condition: stable    Disposition: to pacu    Electronically Signed by Wardell Honour, MD on 01/08/2019 at 10:07 AM

## 2019-01-08 NOTE — Other (Signed)
Daughter Judson Roch called and informed of patient's progress.

## 2019-01-08 NOTE — Progress Notes (Signed)
2000 Report received from [facility].

## 2019-01-08 NOTE — Progress Notes (Addendum)
1600 Bedside and Verbal shift change report given to Hannah,RN (oncoming nurse) by Inocencio Homes (offgoing nurse). Report included the following information SBAR, Kardex, Intake/Output, MAR and Cardiac Rhythm NSR;BBB. Patient resting in bed comfortably. No complaints of pain. CT measured at 70.     1930 Bedside and Verbal shift change report given to Rhonda,RN (Soil scientist) by Hulan Fess (offgoing nurse). Report included the following information SBAR, Kardex, Intake/Output, MAR and Cardiac Rhythm NSR;BBB.

## 2019-01-08 NOTE — Progress Notes (Signed)
TRANSFER - IN REPORT:    Verbal report received from (name) on Andrea [name]  being received from [facility]) for routine progression of care      Report consisted of patient???s Situation, Background, Assessment and   Recommendations(SBAR).     Information from the following report(s) SBAR, Kardex, Intake/Output, MAR, Recent Results, Med Rec Status and Cardiac Rhythm NSR was reviewed with the receiving nurse.    Opportunity for questions and clarification was provided.      Assessment completed upon patient???s arrival to unit and care assumed.       1225 Pt arrived on unit. Telemetry placed and confirmed with monitor tech. VSS.     1550 Bedside and Verbal shift change report given to Jarrett Soho (Soil scientist) by Arnette Felts (offgoing nurse). Report included the following information SBAR, Kardex, Intake/Output, MAR, Recent Results, Med Rec Status and Cardiac Rhythm NSR c BBB.

## 2019-01-08 NOTE — Other (Addendum)
Patient: Andrea [name] MRN: [id number]  SSN: [id number]   Date of Birth: [birth year]  Age: 73 y.o.  Sex: female     Patient is status post Procedure(s):  ROBOTIC LEFT UPPER LOBECTOMY.    Surgeon(s) and Role:     * Wardell Honour, MD - Primary    Local/Dose/Irrigation:  Bupivacaine 0.25%/Lidocaine Plain                  Peripheral IV 01/08/19 Distal;Posterior;Right Forearm (Active)   Site Assessment Clean, dry, & intact 01/08/2019  6:44 AM   Phlebitis Assessment 0 01/08/2019  6:44 AM   Infiltration Assessment 0 01/08/2019  6:44 AM   Dressing Status Clean, dry, & intact;New 01/08/2019  6:44 AM   Dressing Type Tape;Transparent 01/08/2019  6:44 AM   Hub Color/Line Status Green;Infusing 01/08/2019  6:44 AM       Peripheral IV 01/08/19 Left;Posterior Forearm (Active)   Site Assessment Clean, dry, & intact 01/08/2019  6:45 AM   Phlebitis Assessment 0 01/08/2019  6:45 AM   Infiltration Assessment 0 01/08/2019  6:45 AM   Dressing Status Clean, dry, & intact;New 01/08/2019  6:45 AM   Dressing Type Tape;Transparent 01/08/2019  6:45 AM   Hub Color/Line Status Green;Infusing 01/08/2019  6:45 AM            Airway - Endotracheal Tube 01/08/19 (Active)                   Dressing/Packing:  Wound Chest Left Port holes x 4-Dressing Type: Topical skin adhesive/glue (01/08/19 0957)    Splint/Cast:  ]    Other:

## 2019-01-08 NOTE — Op Note (Signed)
[place]  OPERATIVE REPORT    Name:  Andrea [name], Andrea [name]  MR#:  [id number]  DOB:  [birth year]  ACCOUNT #:  [id number]  DATE OF SERVICE:  01/08/2019      CLINICAL SERVICE:  Thoracic Surgery.    ATTENDING SURGEON:  Wardell Honour, MD    OPERATION PERFORMED:  Robotic left upper lobectomy Editor, commissioning [place]).    PREOPERATIVE DIAGNOSES:  1.  Left upper lobe mass.  2.  Hemoptysis.    POSTOPERATIVE DIAGNOSES:  1.  Left upper lobe mass.  2.  Hemoptysis.    FIRST ASSISTANT:  Marcia Brash, PA    SPECIMENS SENT:  1.  Lymph nodes from stations 5, 7, 9L, 10L, 11L, 12L were sent to anatomic pathology.  2.  Left upper lobe was sent to anatomic pathology.    DRAINS AND TUBES:  One 28-French chest tube was left within the left hemithorax.    ANESTHESIA:  General with double-lumen endotracheal intubation.    ESTIMATED BLOOD LOSS:  For this case was 50 mL.    COMPLICATIONS:  None.    INDICATIONS FOR PROCEDURE:  The patient is a 73 year old lady who for several years has had a known left upper lobe nodule.  It was initially evaluated by a thoracic surgeon in 2015 but intervention was declined at that time.  She now presents with an enlarging mass with left upper lobe hemoptysis.  She was worked up by Oncology which revealed the mass was hypermetabolic on PET scan.  She was seen by Thoracic Surgery and after careful discussion with she and her family, decision was made to proceed to the operating room for resection.  PFTs were adequate.    PROCEDURE IN DETAIL:  After informed consent was obtained and placed on the chart, the patient was taken to the operating room and placed supine on the operating table.  General anesthesia with double-lumen endotracheal intubation was induced without complication.  Preoperative antibiotics were administered.  The patient was then placed in the right lateral decubitus position with the left side up.  The patient's left chest was prepped and draped in sterile fashion.  Time-out  was performed.    Through the eighth intercostal space as far anterior as possible, a 12-mm robotic trocar was placed.  The chest was insufflated.  In the same intercostal space more posteriorly, two 8-mm trocars and an additional 12 were placed.  Inferiorly, an assist port was placed.    The robot was then docked without complication.  Instruments were inserted under direct visualization.    The inferior pulmonary ligament was released.  Station 9L lymph node was sent to anatomic pathology.  Posterior pleura was then released.  Station 7 lymph node was dissected out and sent to anatomic pathology.  Station 10L lymph node was dissected out and sent to anatomic pathology.  The proximal portion of the pulmonary artery was exposed and dissected out.  The AP window was then dissected out.  Station 5 lymph node was sent to anatomic pathology.  The mass which was medial on the left upper lobe was not adherent to the aortic arch.    The fissure was then inspected.  The patient had a fairly complete fissure.  The pulmonary artery was dissected out in the fissure.  Multiple station 11L lymph nodes were dissected out and sent to anatomic pathology.  The lingular artery branch was dissected out and divided using Biochemist, clinical.  The apical posterior branches  of the pulmonary artery to the left upper lobe were dissected out and divided using Biochemist, clinical.    The anterior fissure was then completed.  The lobe was then retracted from anterior to posterior.  The superior pulmonary veins were then dissected out and divided using Biochemist, clinical.  Station 12L lymph nodes were dissected out and sent to anatomic pathology.  The remaining branches of the pulmonary artery to the left upper lobe were identified, dissected out and divided using Biochemist, clinical.  This left the left upper lobe bronchus isolated.  This was test occluded and then divided using da Vinci green load stapler.     Hemostasis was ensured throughout the hemithorax.  Specimen bag was introduced through the assist port and the lobe was extirpated through the specimen bag.  The mass was confirmed to be within the lobe.  This was sent to anatomic pathology.    The instruments were withdrawn and the robot was undocked.  The port sites were inspected and found to be hemostatic.    Approximately 60 mL of 1% lidocaine mixed with 0.25% Marcaine was used as local anesthetic to perform intercostal nerve blocks.  A 28-French chest tube was placed posteriorly.  The lung was re-inflated under direct visualization.    The incisions were then closed in a multilayered fashion and covered with Dermabond.    The patient was then reversed from general anesthesia, extubated and taken to PACU in stable condition.    All surgical counts were correct x2 at the end of this case.    There were no immediate complications identified during this case.    Dr. Virgina Organ was present and scrubbed throughout the entire procedure.    PA, [name], was instrumental in completion of this operation as she assisted with opening and closing of all incisions and was the primary bedside assist during the da Vinci portion of this case.        Wardell Honour, MD      [id number]  D:  01/08/2019 13:00  T:  01/08/2019 13:57  JOB #:  [id number]  CC:  Randol Kern, MD

## 2019-01-08 NOTE — Other (Signed)
TRANSFER - OUT REPORT:    Verbal report given to [facility] RN(name) on Andrea [name]  being transferred to CV[facility](unit) for routine post - op       Report consisted of patient???s Situation, Background, Assessment and   Recommendations(SBAR).     Time Pre op antibiotic given:Y  Anesthesia Stop time: Y  Foley Present on Transfer to floor:N  Order for Foley on Chart:N  Discharge Prescriptions with Chart:N    Information from the following report(s) SBAR was reviewed with the receiving nurse.    Opportunity for questions and clarification was provided.     Is the patient on 02? YES       L/Min 2       Other     Is the patient on a monitor? YES    Is the nurse transporting with the patient? YES    Surgical Waiting Area notified of patient's transfer from PACU? YES      The following personal items collected during your admission accompanied patient upon transfer:   Dental Appliance:    Vision:    Hearing Aid:    Jewelry:    Clothing: Clothing: (clothing to PACU')  Other Valuables: Other Valuables: Cell Phone(in pt suitcase)  Valuables sent to safe:

## 2019-01-08 NOTE — H&P (Signed)
Asked to see Andrea RUDEAN ICENHOUR re: LUL mass and hemoptysis  ??  Referred by Dr. Randol Kern  ??  Andrea [name] is a pleasant 73 y/o lady with a past medical history significant for HTN and GERD.  She is referred for surgical workup and possible resection of a LUL mass.  She reports that in 2010 she had a LUL nodule incidentally discovered.  This was followed and in 2015 it had grown and she was seen by a Thoracic surgeon who presented her case to tumor board and biopsy/resection was recommended.  Ultimately she decided against surgery at that time.  ??  She now presents with hemoptysis.  She has been seen by Oncology and has had repeat Chest CT and PET scan.  She coughs up blood tinged sputum almost every night.  She denies pleuritic chest pain.  ??  She and her family describe her as active, able to walk more than a mile and she works outside.  ??        Allergies   Allergen Reactions   ??? Norco [Hydrocodone-Acetaminophen] Other (comments)   ?? ?? "loopy feeling"   ??? Percocet [Oxycodone-Acetaminophen] Other (comments)   ?? ?? "feeling loopy"   ??  ??  PMHx: HTN, GERD  ??  PSHx: tonsillectomy, cholecystectomy  ??  SocHx:  Former smoker  ??        Family History   Problem Relation Age of Onset   ??? Stroke Mother ??   ??? Hypertension Mother ??   ??? Stroke Father ??   ??? Arthritis-osteo Brother ??   ??? Cancer Brother ??   ??? Heart Disease Brother ??   ??? Kidney Disease Brother ??   ??  ??         Outpatient Medications Marked as Taking for the 12/31/18 encounter (Office Visit) with Wardell Honour, MD   Medication Sig Dispense Refill   ??? lisinopriL (PRINIVIL, ZESTRIL) 20 mg tablet Take 20 mg by mouth daily. ?? ??   ??? hydroCHLOROthiazide (HYDRODIURIL) 25 mg tablet Take 25 mg by mouth daily. ?? ??   ??? omeprazole (PRILOSEC) 10 mg capsule Take 10 mg by mouth daily. ?? ??   ??? prednisoLONE acetate (PRED FORTE) 1 % ophthalmic suspension Administer 1 Drop to right eye daily. ?? ??    ??? LORazepam (ATIVAN) 0.5 mg tablet Take 1 Tab by mouth daily as needed for Anxiety. 30 Tab 3   ??? venlafaxine-SR (EFFEXOR-XR) 37.5 mg capsule Take 1 Cap by mouth daily. 30 Cap 3   ??? hydrALAZINE (APRESOLINE) 50 mg tablet Take 50 mg by mouth daily. ?? ??   ??? atenolol (TENORMIN) 25 mg tablet Take 25 mg by mouth daily. ?? ??   ??  ??  ROS:  ??  Constitutional- denies weight loss or gain  HEENT- denies dysphagia  Neuro- denies syncope  Optho- no recent changes in vision  Resp- endorses hemoptysis  CV- denies angina or palpitations  GI- denies abd pain  GU- no complaints  ID- denies recent fevers  Vasc- denies claudication  ??  Blood pressure 145/73, pulse (!) 59, resp. rate 18, height 5\' 2"  (1.575 m), weight 150 lb (68 kg), SpO2 97 %.  ??  On exam she is seated upright  Alert and oriented  Well developed  Appears her stated age  Not tachypneic  Normal speech, normal affect  No cervical or supraclavicular lymphadenopathy  Lungs CTA b/l  No chest wall tenderness  Rrr, no  murmurs  Radial pulses palp b/l  abd sntnd, no organomegaly  LE non edematous  ??  ==============================  ??  Cr 0.57  ??  WBC 5.2  Hgb 12.2  Plts 248  ==============================  ??  I have personally reviewed her Chest CT and PET scan.  She has a 4.3cm spiculated cavitary lesion LUL medially and apically.  This mass abuts the aortic arch and the fat plane is not always visible.  There is no definitive lymphadenopathy.  The mass is PET avid, no hilar or mediastinal PET avid LNs.  There is a PET avid rectal lesion.   ??  ==============================  ??  PFTs- FeV1 >100%  DLCO >100%  ??  ==============================  ??  Diagnoses  1: LUL lung cancer  2: Hemoptysis  ??  =============================  ??  Andrea. [name] has a symptomatic LUL mass that has enlarged over the past ~ten years.  It is now intensely PET avid and abuts the aortic arch. Given her smoking history this should be considered a malignancy until proven  otherwise. It does appear localized except for a small rectal lesion on PET that clinically is not related.  ??  I have reviewed her case with Dr. Orvan Seen and I have reviewed her previous Thoracic notes from Dr. Ria Comment.  Because this is a symptomatic lesion with hemoptysis we should make every effort to resect it.  I discussed robotic left upper lobectomy with her and her daughter.  The only question is how "stuck" to the aortic arch this lesion will be.  That can only be determined intraoperatively.  ??  She sees GI tomorrow  ??  Will post for April 10 or 17th.  ??  Her main comorbidity is her age.  No specific cardiac history or or symptoms.  ??  Thank you for allowing [place] to care for Andrea [name]

## 2019-01-09 ENCOUNTER — Inpatient Hospital Stay: Admit: 2019-01-09 | Payer: MEDICARE

## 2019-01-09 LAB — CBC
Hematocrit: 35.1 % (ref 35.0–47.0)
Hemoglobin: 11.5 g/dL (ref 11.5–16.0)
MCH: 32.7 PG (ref 26.0–34.0)
MCHC: 32.8 g/dL (ref 30.0–36.5)
MCV: 99.7 FL — ABNORMAL HIGH (ref 80.0–99.0)
MPV: 11 FL (ref 8.9–12.9)
NRBC Absolute: 0 10*3/uL (ref 0.00–0.01)
Nucleated RBCs: 0 PER 100 WBC
Platelets: 253 10*3/uL (ref 150–400)
RBC: 3.52 M/uL — ABNORMAL LOW (ref 3.80–5.20)
RDW: 11.9 % (ref 11.5–14.5)
WBC: 10.5 10*3/uL (ref 3.6–11.0)

## 2019-01-09 LAB — BASIC METABOLIC PANEL
Anion Gap: 7 mmol/L (ref 5–15)
BUN: 19 MG/DL (ref 6–20)
Bun/Cre Ratio: 22 — ABNORMAL HIGH (ref 12–20)
CO2: 27 mmol/L (ref 21–32)
Calcium: 8.9 MG/DL (ref 8.5–10.1)
Chloride: 94 mmol/L — ABNORMAL LOW (ref 97–108)
Creatinine: 0.85 MG/DL (ref 0.55–1.02)
EGFR IF NonAfrican American: >60 mL/min/{1.73_m2} (ref >60)
GFR African American: >60 mL/min/{1.73_m2} (ref >60)
Glucose: 100 mg/dL (ref 65–100)
Potassium: 3.6 mmol/L (ref 3.5–5.1)
Sodium: 128 mmol/L — ABNORMAL LOW (ref 136–145)

## 2019-01-09 LAB — TYPE AND SCREEN
ABO/Rh: O POS
Antibody Screen: NEGATIVE

## 2019-01-09 LAB — CBC W/O DIFF
ABSOLUTE NRBC: 0 10*3/uL (ref 0.00–0.01)
HCT: 35.1 % (ref 35.0–47.0)
HGB: 11.5 g/dL (ref 11.5–16.0)
MCH: 32.7 PG (ref 26.0–34.0)
MCHC: 32.8 g/dL (ref 30.0–36.5)
MCV: 99.7 FL — ABNORMAL HIGH (ref 80.0–99.0)
MPV: 11 FL (ref 8.9–12.9)
NRBC: 0 PER 100 WBC
PLATELET: 253 10*3/uL (ref 150–400)
RBC: 3.52 M/uL — ABNORMAL LOW (ref 3.80–5.20)
RDW: 11.9 % (ref 11.5–14.5)
WBC: 10.5 10*3/uL (ref 3.6–11.0)

## 2019-01-09 LAB — METABOLIC PANEL, BASIC
Anion gap: 7 mmol/L (ref 5–15)
BUN/Creatinine ratio: 22 — ABNORMAL HIGH (ref 12–20)
BUN: 19 MG/DL (ref 6–20)
CO2: 27 mmol/L (ref 21–32)
Calcium: 8.9 MG/DL (ref 8.5–10.1)
Chloride: 94 mmol/L — ABNORMAL LOW (ref 97–108)
Creatinine: 0.85 MG/DL (ref 0.55–1.02)
GFR est AA: >60 mL/min/{1.73_m2} (ref >60)
GFR est non-AA: >60 mL/min/{1.73_m2} (ref >60)
Glucose: 100 mg/dL (ref 65–100)
Potassium: 3.6 mmol/L (ref 3.5–5.1)
Sodium: 128 mmol/L — ABNORMAL LOW (ref 136–145)

## 2019-01-09 LAB — TYPE & SCREEN
ABO/Rh(D): O POS
Antibody screen: NEGATIVE

## 2019-01-09 MED FILL — ATENOLOL 25 MG TAB: 25 mg | ORAL | Qty: 1

## 2019-01-09 MED FILL — NORMAL SALINE FLUSH 0.9 % INJECTION SYRINGE: INTRAMUSCULAR | Qty: 10

## 2019-01-09 MED FILL — HYDROCHLOROTHIAZIDE 25 MG TAB: 25 mg | ORAL | Qty: 1

## 2019-01-09 MED FILL — HYDROMORPHONE (PF) 1 MG/ML IJ SOLN: 1 mg/mL | INTRAMUSCULAR | Qty: 1

## 2019-01-09 MED FILL — OXYCODONE-ACETAMINOPHEN 5 MG-325 MG TAB: 5-325 mg | ORAL | Qty: 2

## 2019-01-09 MED FILL — SENEXON-S 8.6 MG-50 MG TABLET: ORAL | Qty: 1

## 2019-01-09 MED FILL — VENLAFAXINE SR 37.5 MG 24 HR CAP: 37.5 mg | ORAL | Qty: 1

## 2019-01-09 MED FILL — KETOROLAC TROMETHAMINE 30 MG/ML INJECTION: 30 mg/mL (1 mL) | INTRAMUSCULAR | Qty: 1

## 2019-01-09 MED FILL — PANTOPRAZOLE 20 MG TAB, DELAYED RELEASE: 20 mg | ORAL | Qty: 1

## 2019-01-09 MED FILL — LISINOPRIL 20 MG TAB: 20 mg | ORAL | Qty: 1

## 2019-01-09 MED FILL — ENOXAPARIN 40 MG/0.4 ML SUB-Q SYRINGE: 40 mg/0.4 mL | SUBCUTANEOUS | Qty: 0.4

## 2019-01-09 MED FILL — LORAZEPAM 0.5 MG TAB: 0.5 mg | ORAL | Qty: 1

## 2019-01-09 MED FILL — HYDRALAZINE 50 MG TAB: 50 mg | ORAL | Qty: 1

## 2019-01-09 NOTE — Progress Notes (Signed)
Problem: Falls - Risk of  Goal: *Absence of Falls  Description: Document Andrea [name] Fall Risk and appropriate interventions in the flowsheet.  Outcome: Progressing Towards Goal  Note: Fall Risk Interventions:            Medication Interventions: Assess postural VS orthostatic hypotension, Teach patient to arise slowly, Patient to call before getting OOB    Elimination Interventions: Call light in reach, Toileting schedule/hourly rounds              Problem: Pressure Injury - Risk of  Goal: *Prevention of pressure injury  Description: Document Braden Scale and appropriate interventions in the flowsheet.  Outcome: Progressing Towards Goal  Note: Pressure Injury Interventions:

## 2019-01-09 NOTE — Progress Notes (Signed)
Mrs. Rodenbeck is POD#1 after a robotic LUL.  No acute events overnight.  Pain well controlled.    Afebrile  HD stable    CT 70ml, no air leak    On exam she is laying in bed  Alert and oriented  Lungs CTA b/l  Dressing c/d/i    CXR- good expansion    Andrea [name] is progressing well.  Adv diet as tolerated.  OOB with PT/OT.  Anticipate removing CT tomorrow and possible d/c/ home.

## 2019-01-09 NOTE — Progress Notes (Signed)
Andrea [name] is POD#1 after a robotic LUL.  No acute events overnight.  Pain well controlled.    Afebrile  HD stable    CT 46ml, no air leak    On exam she is laying in bed  Alert and oriented  Lungs CTA b/l  Dressing c/d/i    CXR- good expansion    Andrea [name] is progressing well.  Adv diet as tolerated.  OOB with PT/OT.  Anticipate removing CT tomorrow and possible d/c/ home.

## 2019-01-10 ENCOUNTER — Inpatient Hospital Stay: Admit: 2019-01-10 | Payer: MEDICARE

## 2019-01-10 MED ORDER — SENNOSIDES-DOCUSATE SODIUM 8.6 MG-50 MG TAB
ORAL_TABLET | Freq: Every day | ORAL | 0 refills | Status: DC
Start: 2019-01-10 — End: 2019-04-15

## 2019-01-10 MED ORDER — HYDROMORPHONE 2 MG TAB
2 mg | ORAL_TABLET | Freq: Four times a day (QID) | ORAL | 0 refills | Status: AC | PRN
Start: 2019-01-10 — End: 2019-01-24

## 2019-01-10 MED FILL — NORMAL SALINE FLUSH 0.9 % INJECTION SYRINGE: INTRAMUSCULAR | Qty: 10

## 2019-01-10 MED FILL — HYDROCHLOROTHIAZIDE 25 MG TAB: 25 mg | ORAL | Qty: 1

## 2019-01-10 MED FILL — LISINOPRIL 20 MG TAB: 20 mg | ORAL | Qty: 1

## 2019-01-10 MED FILL — VENLAFAXINE SR 37.5 MG 24 HR CAP: 37.5 mg | ORAL | Qty: 1

## 2019-01-10 MED FILL — HYDROMORPHONE (PF) 1 MG/ML IJ SOLN: 1 mg/mL | INTRAMUSCULAR | Qty: 1

## 2019-01-10 MED FILL — PANTOPRAZOLE 20 MG TAB, DELAYED RELEASE: 20 mg | ORAL | Qty: 1

## 2019-01-10 MED FILL — HYDRALAZINE 50 MG TAB: 50 mg | ORAL | Qty: 1

## 2019-01-10 MED FILL — KETOROLAC TROMETHAMINE 30 MG/ML INJECTION: 30 mg/mL (1 mL) | INTRAMUSCULAR | Qty: 1

## 2019-01-10 MED FILL — ATENOLOL 25 MG TAB: 25 mg | ORAL | Qty: 1

## 2019-01-10 MED FILL — SENEXON-S 8.6 MG-50 MG TABLET: ORAL | Qty: 1

## 2019-01-10 NOTE — Progress Notes (Signed)
Orders received, chart reviewed and patient evaluated by physical therapy. Recommend:  No skilled physical therapy/ follow up rehabilitation needs identified at this time.    Recommend with nursing patient to complete as able in order to maintain strength, endurance and independence: OOB to chair 3x/day with SBA and ambulating with SBA. Thank you for your assistance.     Full evaluation to follow.

## 2019-01-10 NOTE — Progress Notes (Signed)
'  I have reviewed discharge instructions with the patient.  The patient verbalized understanding.

## 2019-01-10 NOTE — Discharge Summary (Signed)
Physician Discharge Summary     Patient ID:  Andrea [name]  [id number]  72 y.o.  [birth year]    Admit Date: 01/08/2019    Discharge Date:01/10/2019  Admission Diagnoses: Lung cancer [facility]) [C34.90]    Discharge Diagnoses:  Active Problems:    Lung cancer ([place]) (01/08/2019)         Admission Condition: [place]    Discharge Condition: Fair    Last Procedure: Procedure(s):  ROBOTIC LEFT UPPER [place] Hospital Course:   Normal hospital course for this procedure.    Consults: PT/OT    Significant Diagnostic Studies: labs:, radiology: CXR: see EMR and cardiac graphics: Telemetry: NSR  PATH: pending                    Disposition: home    Patient Instructions:   Cannot display discharge medications since this patient is not currently admitted.    Activity: Activity as tolerated and No lifting, Driving, or Strenuous exercise for 2-3 weeks  Diet: Resume previous diet  Wound Care: As directed in discharge instructions    Follow-up with Thoracic Surgery in 1 week.  Follow-up tests/labs CXR    Sign:  Dorann Lodge, NP

## 2019-01-10 NOTE — Progress Notes (Signed)
 PHYSICAL THERAPY EVALUATION/DISCHARGE  Patient: Andrea [name] (73 y.o. female)  Date: 01/10/2019  Primary Diagnosis: Lung cancer (HCC) [C34.90]  Procedure(s) (LRB):  ROBOTIC LEFT UPPER LOBECTOMY (Left) 2 Days Post-Op   Precautions:          ASSESSMENT  Based on the objective data described below, the patient appears to be at baseline for functional mobility. Pt eager to mobilize and able to don robe. Pt ascended from EOB without requiring to use UE. Pt presented with initial decreased cadence and path deviations toward L side (appeared to be due to unfamiliar environment). Pt progressed from supervision to mod I for ambulation due to decreased cadence. Pt able to negotiate stairs using step to pattern with no LOB noted. Pt then ambulated back to room. Educated pt on continuing to ambulate to prevent deconditioning and tips for energy conservation. All questions answered, pt stated no concerns for d/c to home. Pt will have assistance from DTR.     Functional Outcome Measure:  The patient scored 100/100 on the Barthel Index outcome measure which is indicative of patient is 0% dependent on others for ADLs and mobility.      Other factors to consider for discharge: will have DTR for assistance, 13 STE     Further skilled acute physical therapy is not indicated at this time.     PLAN :  Recommendation for discharge: (in order for the patient to meet his/her long term goals)  No skilled physical therapy/ follow up rehabilitation needs identified at this time.    This discharge recommendation:  Has not yet been discussed the attending provider and/or case management    IF patient discharges home will need the following DME: none       SUB[place]IVE:   [place]    OBJECTIVE DATA SUMMARY:   HISTORY:    Past Medical History:   Diagnosis Date    Anxiety     Arthritis     Depression     Essential hypertension     Gallstone     GERD (gastroesophageal reflux disease)     Hemoptysis     Lung cancer  (HCC) 2020    RBBB      Past Surgical History:   Procedure Laterality Date    HX CATARACT REMOVAL Right 07/2018    HX CHOLECYSTECTOMY  06/14/2014    HX GI      COLONOSCOPY    HX MOHS PROCEDURES  2011    HX ORTHOPAEDIC Bilateral 1970`S    CARPAL TUNNEL     HX POLYPECTOMY      X2    HX ROTATOR CUFF REPAIR Right 08/2017    HX ROTATOR CUFF REPAIR Left 2011    HX TONSILLECTOMY  1962       Prior level of function: independent, retired  Copywriter, 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    Home Situation  Home Environment: Private residence  # Steps to Enter: 13  Rails to Enter: Yes  Hand Rails : Bilateral  One/Two Story Residence: One Engineer, materials Steps: 17  Living Alone: No  Support Systems: Child(ren)  Patient Expects to be Discharged to:: Private residence  Current DME Used/Available at Home: Environmental consultant, rolling, Paediatric nurse  Tub or Shower Type: Shower    EXAMINATION/PRESENTATION/DECISION MAKING:   Critical Behavior:  Neurologic State: Alert  Orientation Level: Oriented X4  Cognition: Appropriate decision making     Hearing:  Auditory  Auditory Impairment: None  Hearing Aids/Status: Does not own  Skin:  intact  Edema: none noted  Range Of Motion:  AROM: Within functional limits           PROM: Within functional limits           Strength:    Strength: Within functional limits          Functional Mobility:  Bed Mobility:     Supine to Sit: Modified independent     Scooting: Modified independent  Transfers:  Sit to Stand: Modified independent  Stand to Sit: Modified independent                       Balance:   Sitting: Intact  Standing: Intact;Without support  Ambulation/Gait Training:  Distance (ft): 150 Feet (ft)(x2)  Assistive Device: Gait belt  Ambulation - Level of Assistance: Supervision;Modified independent        Gait Abnormalities: Path deviations        Base of Support: Narrowed;Shift to left  Stance: Weight shift  Speed/Cadence: Fluctuations     Stairs:  Number of Stairs Trained:  2(x3)  Stairs - Level of Assistance: Modified independent   Rail Use: Right     Functional Measure:  Barthel Index:    Bathing: 5  Bladder: 10  Bowels: 10  Grooming: 5  Dressing: 10  Feeding: 10  Mobility: 15  Stairs: 10  Toilet Use: 10  Transfer (Bed to Chair and Back): 15  Total: 100/100       The Barthel ADL Index: Guidelines  1. The index should be used as a record of what a patient does, not as a record of what a patient could do.  2. The main aim is to establish degree of independence from any help, physical or verbal, however minor and for whatever reason.  3. The need for supervision renders the patient not independent.  4. A patient's performance should be established using the best available evidence. Asking the patient, friends/relatives and nurses are the usual sources, but direct observation and common sense are also important. However direct testing is not needed.  5. Usually the patient's performance over the preceding 24-48 hours is important, but occasionally longer periods will be relevant.  6. Middle categories imply that the patient supplies over 50 per cent of the effort.  7. Use of aids to be independent is allowed.    Henriette Desanctis., Barthel, D.W. [phone]). Functional evaluation: the Barthel Index. Md State Med J (14)2.  Fleeta der Murdock, J.J.M.F, [place], DAIVD., [place]., [place], [place]. (1999). Measuring the change indisability after inpatient rehabilitation; comparison of the responsiveness of the Barthel Index and Functional Independence Measure. Journal of Neurology, Neurosurgery, and Psychiatry, 66(4), [phone].  Fleeta Chillington, N.J.A, [place]te [place],  W.J.M, & Koopmanschap, M.A. (2004.) Assessment of post-stroke quality of life in cost-effectiveness studies: The usefulness of the Barthel Index and the EuroQoL-5D. Quality of Life Research, 49, 572-56            Physical Therapy Evaluation Charge Determination   History Examination Presentation Decision-Making   MEDIUM  Complexity : 1-2  comorbidities / personal factors will impact the outcome/ POC  LOW Complexity : 1-2 Standardized tests and measures addressing body structure, function, activity limitation and / or participation in recreation  LOW Complexity : Stable, uncomplicated  LOW Complexity : FOTO score of 75-100      Based on the above components, the patient evaluation is determined to be  of the following complexity level: LOW         Activity Tolerance:   Good  Please refer to the flowsheet for vital signs taken during this treatment.    After treatment patient left in no apparent distress:   Supine in bed and Call bell within reach    COMMUNICATION/EDUCATION:   The patient's plan of care was discussed with: Registered nurse.     Fall prevention education was provided and the patient/caregiver indicated understanding. and Patient/family have participated as able in goal setting and plan of care.    Thank you for this referral.  Maggie Thornhill, PT, DPT   Time Calculation: 17 mins

## 2019-01-10 NOTE — Progress Notes (Signed)
Andrea [name] is POD#2 after a robotic LUL.  No acute events overnight.  Pain well controlled.  ??  Afebrile  HD stable  ??  CT ~148ml, no air leak  ??  On exam she is laying in bed  Alert and oriented  Lungs CTA b/l  Dressing c/d/i  ??  CXR- good expansion  ??  Mrs. Frei is progressing well.  Remove CT and d/c home.

## 2019-01-10 NOTE — Progress Notes (Signed)
PHYSICAL THERAPY EVALUATION/DISCHARGE  Patient: Andrea [name] (73 y.o. female)  Date: 01/10/2019  Primary Diagnosis: Lung cancer ([place]) [C34.90]  Procedure(s) (LRB):  ROBOTIC LEFT UPPER LOBECTOMY (Left) 2 Days Post-Op   Precautions:          ASSESSMENT  Based on the objective data described below, the patient appears to be at baseline for functional mobility. Pt eager to mobilize and able to don robe. Pt ascended from EOB without requiring to use UE. Pt presented with initial decreased cadence and path deviations toward L side (appeared to be due to unfamiliar environment). Pt progressed from supervision to mod I for ambulation due to decreased cadence. Pt able to negotiate stairs using step to pattern with no LOB noted. Pt then ambulated back to room. Educated pt on continuing to ambulate to prevent deconditioning and tips for energy conservation. All questions answered, pt stated no concerns for d/c to home. Pt will have assistance from DTR.     Functional Outcome Measure:  The patient scored 100/100 on the Barthel Index outcome measure which is indicative of patient is 0% dependent on others for ADLs and mobility.      Other factors to consider for discharge: will have DTR for assistance, 13 STE     Further skilled acute physical therapy is not indicated at this time.     PLAN :  Recommendation for discharge: (in order for the patient to meet his/her long term goals)  No skilled physical therapy/ follow up rehabilitation needs identified at this time.    This discharge recommendation:  Has not yet been discussed the attending provider and/or case management    IF patient discharges home will need the following DME: none       SUBJECTIVE:   [place]    OBJECTIVE DATA SUMMARY:   HISTORY:    Past Medical History:   Diagnosis Date    Anxiety     Arthritis     Depression     Essential hypertension     Gallstone     GERD (gastroesophageal reflux disease)     Hemoptysis      Lung cancer ([place]) 2020    RBBB      Past Surgical History:   Procedure Laterality Date    HX CATARACT REMOVAL Right 07/2018    HX CHOLECYSTECTOMY  06/14/2014    HX GI      COLONOSCOPY    HX MOHS PROCEDURES  2011    HX ORTHOPAEDIC Bilateral 1970`S    CARPAL TUNNEL     HX POLYPECTOMY      X2    HX ROTATOR CUFF REPAIR Right 08/2017    HX ROTATOR CUFF REPAIR Left 2011    HX TONSILLECTOMY  1962       Prior level of function: independent, retired  Charity fundraiser factors and/or comorbidities impacting plan of care: PMH    Home Situation  Home Environment: Private residence  # Steps to Enter: 77  Rails to Enter: Yes  Hand Rails : Bilateral  One/Two Story Residence: One Wellsite geologist Steps: 17  Living Alone: No  Support Systems: Child(ren)  Patient Expects to be Discharged to:: Private residence  Current DME Used/Available at Home: Environmental consultant, rolling, Civil engineer, contracting  Tub or Shower Type: Shower    EXAMINATION/PRESENTATION/DECISION MAKING:   Critical Behavior:  Neurologic State: Alert  Orientation Level: Oriented X4  Cognition: Appropriate decision making     Hearing:  Auditory  Auditory Impairment: None  Hearing Aids/Status: Does not own  Skin:  intact  Edema: none noted  Range Of Motion:  AROM: Within functional limits           PROM: Within functional limits           Strength:    Strength: Within functional limits          Functional Mobility:  Bed Mobility:     Supine to Sit: Modified independent     Scooting: Modified independent  Transfers:  Sit to Stand: Modified independent  Stand to Sit: Modified independent                       Balance:   Sitting: Intact  Standing: Intact;Without support  Ambulation/Gait Training:  Distance (ft): 150 Feet (ft)(x2)  Assistive Device: Gait belt  Ambulation - Level of Assistance: Supervision;Modified independent        Gait Abnormalities: Path deviations        Base of Support: Narrowed;Shift to left  Stance: Weight shift  Speed/Cadence: Fluctuations     Stairs:   Number of Stairs Trained: 2(x3)  Stairs - Level of Assistance: Modified independent   Rail Use: Right     Functional Measure:  Barthel Index:    Bathing: 5  Bladder: 10  Bowels: 10  Grooming: 5  Dressing: 10  Feeding: 10  Mobility: 15  Stairs: 10  Toilet Use: 10  Transfer (Bed to Chair and Back): 15  Total: 100/100       The Barthel ADL Index: Guidelines  1. The index should be used as a record of what a patient does, not as a record of what a patient could do.  2. The main aim is to establish degree of independence from any help, physical or verbal, however minor and for whatever reason.  3. The need for supervision renders the patient not independent.  4. A patient's performance should be established using the best available evidence. Asking the patient, friends/relatives and nurses are the usual sources, but direct observation and common sense are also important. However direct testing is not needed.  5. Usually the patient's performance over the preceding 24-48 hours is important, but occasionally longer periods will be relevant.  6. Middle categories imply that the patient supplies over 50 per cent of the effort.  7. Use of aids to be independent is allowed.    Daneen Schick., Barthel, D.W. [phone]). Functional evaluation: the Barthel Index. [place] (14)2.  Lucianne Lei der [place], J.J.M.F, [place], Diona Browner., Oris Drone., [place], [place] (1999). Measuring the change indisability after inpatient rehabilitation; comparison of the responsiveness of the Barthel Index and Functional Independence Measure. Journal of Neurology, Neurosurgery, and Psychiatry, 66(4), [phone].  Wilford Sports, N.J.A, [place],  W.J.M, & Koopmanschap, M.A. (2004.) Assessment of post-stroke quality of life in cost-effectiveness studies: The usefulness of the Barthel Index and the EuroQoL-5D. Quality of Life Research, 19, 427-43            Physical Therapy Evaluation Charge Determination   History Examination Presentation Decision-Making    MEDIUM  Complexity : 1-2 comorbidities / personal factors will impact the outcome/ POC  LOW Complexity : 1-2 Standardized tests and measures addressing body structure, function, activity limitation and / or participation in recreation  LOW Complexity : Stable, uncomplicated  LOW Complexity : FOTO score of 75-100      Based on the above components, the patient evaluation is determined to be  of the following complexity level: LOW         Activity Tolerance:   Good  Please refer to the flowsheet for vital signs taken during this treatment.    After treatment patient left in no apparent distress:   Supine in bed and Call bell within reach    COMMUNICATION/EDUCATION:   The patient???s plan of care was discussed with: Registered nurse.     Fall prevention education was provided and the patient/caregiver indicated understanding. and Patient/family have participated as able in goal setting and plan of care.    Thank you for this referral.  Domingo Cocking, PT, DPT   Time Calculation: 17 mins

## 2019-01-10 NOTE — Progress Notes (Signed)
Andrea [name] is POD#2 after a robotic LUL.  No acute events overnight.  Pain well controlled.  ??  Afebrile  HD stable  ??  CT ~150ml, no air leak  ??  On exam she is laying in bed  Alert and oriented  Lungs CTA b/l  Dressing c/d/i  ??  CXR- good expansion  ??  Andrea [name] is progressing well.  Remove CT and d/c home.

## 2019-01-10 NOTE — Discharge Summary (Signed)
Physician Discharge Summary     Patient ID:  Andrea [name]  [id number]  73 y.o.  [birth year]    Admit Date: 01/08/2019    Discharge Date:01/10/2019  Admission Diagnoses: Lung cancer [facility]) [C34.90]    Discharge Diagnoses:  Active Problems:    Lung cancer ([place]) (01/08/2019)         Admission Condition: [place]    Discharge Condition: Fair    Last Procedure: Procedure(s):  ROBOTIC LEFT UPPER [place] Hospital Course:   Normal hospital course for this procedure.    Consults: PT/OT    Significant Diagnostic Studies: labs:, radiology: CXR: see EMR and cardiac graphics: Telemetry: NSR  PATH: pending                    Disposition: home    Patient Instructions:   Cannot display discharge medications since this patient is not currently admitted.    Activity: Activity as tolerated and No lifting, Driving, or Strenuous exercise for 2-3 weeks  Diet: Resume previous diet  Wound Care: As directed in discharge instructions    Follow-up with Thoracic Surgery in 1 week.  Follow-up tests/labs CXR    Sign:  Dorann Lodge, NP

## 2019-01-11 NOTE — Progress Notes (Signed)
 Patient contacted regarding recent discharge and COVID-19 risk   Care Transition Nurse/ Ambulatory Care Manager contacted the patient by telephone to perform post discharge assessment. Verified name and DOB with patient as identifiers.     Patient has following risk factors of: lung cancer, immunocompromised. CTN/ACM reviewed discharge instructions, medical action plan and red flags related to discharge diagnosis. Reviewed and educated them on any new and changed medications related to discharge diagnosis.  Advised obtaining a 90-day supply of all daily and as-needed medications.     Education provided regarding infection prevention, and signs and symptoms of COVID-19 and when to seek medical attention with patient who verbalized understanding. Discussed exposure protocols and quarantine from [facility] Guidelines "Are you at higher risk for severe illness 2019" and given an opportunity for questions and concerns. The patient agrees to contact the COVID-19 hotline [phone] or PCP office for questions related to their healthcare. CTN/ACM provided contact information for future reference.    From CDC: Are you at higher risk for severe illness?    SABRA Hauser your hands often.  SABRA Avoid close contact (6 feet, which is about two arm lengths) with people who are sick.  . Put distance between yourself and other people if COVID-19 is spreading in your community.  . Clean and disinfect frequently touched surfaces.  . Avoid all cruise travel and non-essential air travel.  . Call your healthcare professional if you have concerns about COVID-19 and your underlying condition or if you are sick.    For more information on steps you can take to protect yourself, see CDC's How to Protect Yourself      Patient/family/caregiver given information for GetWell Loop and agrees to enroll no    Plan for follow-up call in 14 days based on severity of symptoms and risk factors

## 2019-01-11 NOTE — Progress Notes (Signed)
Patient contacted regarding recent discharge and COVID-19 risk   Care Transition Nurse/ Ambulatory Care Manager contacted the patient by telephone to perform post discharge assessment. Verified name and DOB with patient as identifiers.     Patient has following risk factors of: lung cancer, immunocompromised. CTN/ACM reviewed discharge instructions, medical action plan and red flags related to discharge diagnosis. Reviewed and educated them on any new and changed medications related to discharge diagnosis.  Advised obtaining a 90-day supply of all daily and as-needed medications.     Education provided regarding infection prevention, and signs and symptoms of COVID-19 and when to seek medical attention with patient who verbalized understanding. Discussed exposure protocols and quarantine from [facility] Guidelines ???Are you at higher risk for severe illness 2019??? and given an opportunity for questions and concerns. The patient agrees to contact the COVID-19 hotline [phone] or PCP office for questions related to their healthcare. CTN/ACM provided contact information for future reference.    From CDC: Are you at higher risk for severe illness?    ??? Wash your hands often.  ??? Avoid close contact (6 feet, which is about two arm lengths) with people who are sick.  ??? Put distance between yourself and other people if COVID-19 is spreading in your community.  ??? Clean and disinfect frequently touched surfaces.  ??? Avoid all cruise travel and non-essential air travel.  ??? Call your healthcare professional if you have concerns about COVID-19 and your underlying condition or if you are sick.    For more information on steps you can take to protect yourself, see CDC's How to Protect Yourself      Patient/family/caregiver given information for GetWell Loop and agrees to enroll no    Plan for follow-up call in 14 days based on severity of symptoms and risk factors

## 2019-01-12 NOTE — Telephone Encounter (Signed)
Called Pt per Dr Orvan Seen to set up appointment to come in to discuss pathology results.  Pt does not feel comfortable coming into the office.  Call transferred to Dr Orvan Seen per his request.

## 2019-01-14 ENCOUNTER — Encounter

## 2019-01-15 ENCOUNTER — Telehealth

## 2019-01-15 MED ORDER — LORAZEPAM 0.5 MG TAB
0.5 mg | ORAL_TABLET | Freq: Every day | ORAL | 2 refills | Status: DC | PRN
Start: 2019-01-15 — End: 2019-01-26

## 2019-01-20 ENCOUNTER — Ambulatory Visit: Attending: Family

## 2019-01-20 ENCOUNTER — Ambulatory Visit: Admit: 2019-01-20 | Discharge: 2019-01-20 | Payer: MEDICARE | Attending: Family

## 2019-01-20 ENCOUNTER — Inpatient Hospital Stay: Admit: 2019-01-20 | Payer: MEDICARE

## 2019-01-20 DIAGNOSIS — C3492 Malignant neoplasm of unspecified part of left bronchus or lung: Secondary | ICD-10-CM

## 2019-01-20 DIAGNOSIS — J9811 Atelectasis: Secondary | ICD-10-CM

## 2019-01-20 NOTE — Progress Notes (Signed)
Subjective:      Andrea [name] is a 73 y.o. female presents for postop care .     Procedure: 01/08/2019: Robotic Left Upper Lobectomy by Dr Glo Herring for Invasive Adenocarcinoma pT2b pN1 stage IIB    Appetite is fair. Eating a regular diet without difficulty.   Bowel movements are regular.  The patient is voiding without difficulty.  Pain is controlled with current analgesics.  Medication(s) being used: acetaminophen, narcotic analgesics including occasional Dilaudid. She states she doesn't like the way the Dilaudid makes her feel.     Objective:     Visit Vitals  BP 140/68 (BP 1 Location: Right arm, BP Patient Position: Sitting)   Pulse 63   Temp 98.7 ??F (37.1 ??C) (Oral)   Resp 18   Ht 5\' 2"  (1.575 m)   Wt 164 lb (74.4 kg)   SpO2 98%   BMI 30.00 kg/m??       Physical Exam:  GENERAL: alert, cooperative, no distress, appears stated age, LUNG: clear to auscultation bilaterally, HEART: regular rate and rhythm, S1, S2 normal, no murmur, click, rub or gallop, ABDOMEN: soft, non-tender. Bowel sounds normal. No masses,  no organomegaly, EXTREMITIES:  extremities normal, atraumatic, no cyanosis or edema, SKIN: Normal. and no rash or abnormalities. All incisions are healing well. Chest tube site sutures removed without incidence.    Data Review images and reports reviewed  CXR 01/20/19??  FINDINGS: PA and lateral views of the chest demonstrate a stable  cardiomediastinal silhouette. There is increased left upper lobe linear  atelectasis and juxtaphrenic peaking at the left base. Tiny left pleural  effusion persists. The right lung remains clear. There is no pneumothorax. Old  left-sided rib fractures are unchanged. Postoperative changes are present in the  right shoulder.  ??  IMPRESSION  IMPRESSION: Increased left upper lobe atelectasis and juxtaphrenic peaking at  the left base. Persistent small left pleural effusion.  Assessment:     Patient Active Problem List   Diagnosis Code   ??? Cavitating mass in left upper lung lobe  J98.4   ??? Lung cancer ([place]) C34.90       Doing well postoperatively but having neuropraxic pain. She is taking Acetaminophen and it was controlling the pain until last evening. She states the pain is under she left scapula and is a burning sharpe pain. I instructed her to take Naproxen two tablets every 12 hours with food and apply heat to the area. She will call me on 01/25/19 with a progress report. We discussed her pathology and that Dr Orvan Seen will use the bio markers to determine her treatment.           Plan/Recommendations/Medical Decision Making:     Per the NCCN guidelines: Andrea [name] will be followed routinely with a history and physical and imaging (CT Chest) every 6 months for the first 2 years then yearly. I will see her back in 3 months but defer imaging to Dr Orvan Seen.    We discussed the importance of proper diet and 30 minutes/day of proper exercise.    All questions were personally answered.    Thank you for allowing [place] to participate in the care of your patient.    Tempie Hoist, [employer]  Thoracic Surgery

## 2019-01-20 NOTE — Progress Notes (Signed)
Follow up for Robotic Left Upper Lobectomy on 01-08-2019.      1. Have you been to the ER, urgent care clinic since your last visit?  Hospitalized since your last visit?No    2. Have you seen or consulted any other health care providers outside of the [facility] since your last visit?  Include any pap smears or colon screening. No

## 2019-01-20 NOTE — Progress Notes (Signed)
Follow up for Andrea [name] on 01-08-2019.      1. Have you been to the ER, urgent care clinic since your last visit?  Hospitalized since your last visit?No    2. Have you seen or consulted any other health care providers outside of the [place] since your last visit?  Include any pap smears or colon screening. No

## 2019-01-20 NOTE — Patient Instructions (Signed)
OK to drive if not taking narcotic pain medication  Take Naproxen (Aleve) two tablets with food every 12 hours for the next 5 days. Apply heat to your incisions for comfort.   Call me on 01/25/19 with up date on your pain

## 2019-01-20 NOTE — Progress Notes (Signed)
Subjective:      Andrea [name] is a 73 y.o. female presents for postop care .     Procedure: 01/08/2019: Robotic Left Upper Lobectomy by Dr Glo Herring for Invasive Adenocarcinoma pT2b pN1 stage IIB    Appetite is fair. Eating a regular diet without difficulty.   Bowel movements are regular.  The patient is voiding without difficulty.  Pain is controlled with current analgesics.  Medication(s) being used: acetaminophen, narcotic analgesics including occasional Dilaudid. She states she doesn't like the way the Dilaudid makes her feel.     Objective:     Visit Vitals  BP 140/68 (BP 1 Location: Right arm, BP Patient Position: Sitting)   Pulse 63   Temp 98.7 ??F (37.1 ??C) (Oral)   Resp 18   Ht 5\' 2"  (1.575 m)   Wt 164 lb (74.4 kg)   SpO2 98%   BMI 30.00 kg/m??       Physical Exam:  GENERAL: alert, cooperative, no distress, appears stated age, LUNG: clear to auscultation bilaterally, HEART: regular rate and rhythm, S1, S2 normal, no murmur, click, rub or gallop, ABDOMEN: soft, non-tender. Bowel sounds normal. No masses,  no organomegaly, EXTREMITIES:  extremities normal, atraumatic, no cyanosis or edema, SKIN: Normal. and no rash or abnormalities. All incisions are healing well. Chest tube site sutures removed without incidence.    Data Review images and reports reviewed  CXR 01/20/19??  FINDINGS: PA and lateral views of the chest demonstrate a stable  cardiomediastinal silhouette. There is increased left upper lobe linear  atelectasis and juxtaphrenic peaking at the left base. Tiny left pleural  effusion persists. The right lung remains clear. There is no pneumothorax. Old  left-sided rib fractures are unchanged. Postoperative changes are present in the  right shoulder.  ??  IMPRESSION  IMPRESSION: Increased left upper lobe atelectasis and juxtaphrenic peaking at  the left base. Persistent small left pleural effusion.  Assessment:     Patient Active Problem List   Diagnosis Code    ??? Cavitating mass in left upper lung lobe J98.4   ??? Lung cancer ([place]) C34.90       Doing well postoperatively but having neuropraxic pain. She is taking Acetaminophen and it was controlling the pain until last evening. She states the pain is under she left scapula and is a burning sharpe pain. I instructed her to take Naproxen two tablets every 12 hours with food and apply heat to the area. She will call me on 01/25/19 with a progress report. We discussed her pathology and that Dr Orvan Seen will use the bio markers to determine her treatment.           Plan/Recommendations/Medical Decision Making:     Per the NCCN guidelines: KHADIJAH MASTRIANNI will be followed routinely with a history and physical and imaging (CT Chest) every 6 months for the first 2 years then yearly. I will see her back in 3 months but defer imaging to Dr Orvan Seen.    We discussed the importance of proper diet and 30 minutes/day of proper exercise.    All questions were personally answered.    Thank you for allowing [place] to participate in the care of your patient.    Tempie Hoist, [place]  Thoracic Surgery

## 2019-01-25 NOTE — Progress Notes (Signed)
Patient resolved from Transition of Care episode on 01/25/2019    Outreach attempt made for COVID 14 day f/u. No answer. LM on VM identifying self and purpose for call.  MyChart message sent.    No further outreach scheduled with this CTN/ACM.  Episode of Care resolved.  Patient has this CTN/ACM contact information if future needs arise.

## 2019-01-26 ENCOUNTER — Inpatient Hospital Stay: Admit: 2019-01-26 | Payer: MEDICARE | Attending: Psychiatry

## 2019-01-26 DIAGNOSIS — F411 Generalized anxiety disorder: Secondary | ICD-10-CM

## 2019-01-26 MED ORDER — LORAZEPAM 0.5 MG TAB
0.5 mg | ORAL_TABLET | Freq: Two times a day (BID) | ORAL | 2 refills | Status: DC | PRN
Start: 2019-01-26 — End: 2019-03-30

## 2019-01-26 MED ORDER — VENLAFAXINE SR 37.5 MG 24 HR CAP
37.5 mg | ORAL_CAPSULE | Freq: Every day | ORAL | 2 refills | Status: DC
Start: 2019-01-26 — End: 2019-03-30

## 2019-01-26 NOTE — Progress Notes (Signed)
INTERVAL HISTORY (Phone Session): Andrea [name] is a 73 year old widowed white female following up with me??4.5 months??since her last??appointment re: significant symptoms of Major Depression as well as Generalized Anxiety disorder. I had??stopped??the Effexor XR at the first appt (75 mg) due to severe noradrenergic SE's, but she became worse??after that??with intense NM's and some increased emotional lability so we started Zoloft??but later changed to??Lexapro??due to increased anxiety. Over 7 months ago??we stopped the Lexapro (and Baclofen) and went back to low-dose Effexor XR because it's the last med she felt better taking and she began to feel better. Last time she was feeling "excellent" affectively.  ??  She was contacted by phone today and stated that she's??been struggling more due to being diagnosed with Lung CA and having a RUL lobectomy 3 weeks ago. She's having more pain and discomfort from the surgery, and she's also more emotionally stressed as well. The CA is a stage 2B AdenoCA--only 1 lymph node out of 14 had CA in it. She also had a possible small growth in her rectum--hasn't had a colonoscopy in a number of years. Not surprisingly, she's been more dysphoric and anxious, and feels more isolated and lonely. She can't really move closer to her son (Andrea [name]) or her daughter (Andrea [name]) now due to their individual problems. She's back to being quite distressed and is willing to restart Tx which is strongly needed. She's again having the bruxism and some other somatic manifestations of the stress (HTN, etc.). She's still using??the Ativan sparingly and it does still help with the mild residual anxiety that she has at times.??No SE's with the Effexor at this dose.??  ??  CURRENT MEDICATIONS:  1.????Effexor XR 37.5??mg daily  2. ??Lorazepam??0.5??mg prn--takes??0.5 mg, only at night  ??  MENTAL STATUS EXAM:????Andrea [name]??is noted to be fairly pleasant and cooperative, but reported increased??depressive symptoms??as noted above. She was much  more animated and less irritable, and she denied having any significant??neurovegetative dysfunction??and she denied??any??SI or PDW thoughts as well.??Her anxiety level was??much less??intense??as well.??She denied having any manic or hypomanic symptoms??and there was no evidence of any psychosis such as hallucinations or delusional thinking,??and her thoughts were fairly organized and coherent. ??Her judgment and insight appear to be better??now??and cognitively her fund of knowledge is above average. ??  ??  DIAGNOSTIC IMPRESSION:  AXIS I: ??Major Depression, recurrent, moderate (F33.1)   ????????????????????????Generalized??Anxiety disorder--stable (F41.1).  AXIS II: ??Deferred--some??.  AXIS III: ??Hypertension; gastroesophageal reflux disease; rotator cuff repair.  ??  PLAN:  1.????Continue the??Effexor XR at only 37.5 mg daily--#30 with??3 refills.  2.????"Increase" the Ativan to 0.5 mg??bid prn--#60 with 2 refills.  3.????Encouraged her??to start counseling/therapy with??Lee Archerd.  4. ??She will follow up with me in??4-6 weeks, sooner if needed.

## 2019-01-26 NOTE — Telephone Encounter (Signed)
Patient called and stated she would like a callback regarding bio markers.    [phone]

## 2019-01-26 NOTE — Progress Notes (Signed)
INTERVAL HISTORY (Phone Session): Andrea [name] is a 73 year old widowed white female following up with me??4.5 months??since her last??appointment re: significant symptoms of Major Depression as well as Generalized Anxiety disorder. I had??stopped??the Effexor XR at the first appt (75 mg) due to severe noradrenergic SE's, but she became worse??after that??with intense NM's and some increased emotional lability so we started Zoloft??but later changed to??Lexapro??due to increased anxiety. Over 7 months ago??we stopped the Lexapro (and Baclofen) and went back to low-dose Effexor XR because it's the last med she felt better taking and she began to feel better. Last time she was feeling "excellent" affectively.  ??  She was contacted by phone today and stated that she's??been struggling more due to being diagnosed with Lung CA and having a RUL lobectomy 3 weeks ago. She's having more pain and discomfort from the surgery, and she's also more emotionally stressed as well. The CA is a stage 2B AdenoCA--only 1 lymph node out of 14 had CA in it. She also had a possible small growth in her rectum--hasn't had a colonoscopy in a number of years. Not surprisingly, she's been more dysphoric and anxious, and feels more isolated and lonely. She can't really move closer to her son ([place]) or her daughter ([place]) now due to their individual problems. She's back to being quite distressed and is willing to restart Tx which is strongly needed. She's again having the bruxism and some other somatic manifestations of the stress (HTN, etc.). She's still using??the Ativan sparingly and it does still help with the mild residual anxiety that she has at times.??No SE's with the Effexor at this dose.??  ??  CURRENT MEDICATIONS:  1.????Effexor XR 37.5??mg daily  2. ??Lorazepam??0.5??mg prn--takes??0.5 mg, only at night  ??  MENTAL STATUS EXAM:????Andrea [name]??is noted to be fairly pleasant and cooperative, but reported increased??depressive symptoms??as noted above.  She was much more animated and less irritable, and she denied having any significant??neurovegetative dysfunction??and she denied??any??SI or PDW thoughts as well.??Her anxiety level was??much less??intense??as well.??She denied having any manic or hypomanic symptoms??and there was no evidence of any psychosis such as hallucinations or delusional thinking,??and her thoughts were fairly organized and coherent. ??Her judgment and insight appear to be better??now??and cognitively her fund of knowledge is above average. ??  ??  DIAGNOSTIC IMPRESSION:  AXIS I: ??Major Depression, recurrent, moderate (F33.1)   ????????????????????????Generalized??Anxiety disorder--stable (F41.1).  AXIS II: ??Deferred--some??.  AXIS III: ??Hypertension; gastroesophageal reflux disease; rotator cuff repair.  ??  PLAN:  1.????Continue the??Effexor XR at only 37.5 mg daily--#30 with??3 refills.  2.????"Increase" the Ativan to 0.5 mg??bid prn--#60 with 2 refills.  3.????Encouraged her??to start counseling/therapy with??Lee Archerd.  4. ??She will follow up with me in??4-6 weeks, sooner if needed.

## 2019-01-26 NOTE — Telephone Encounter (Signed)
Called by MD

## 2019-02-23 ENCOUNTER — Inpatient Hospital Stay: Admit: 2019-02-23 | Payer: MEDICARE | Attending: Psychiatry

## 2019-02-23 MED ORDER — HYDROXYZINE PAMOATE 25 MG CAP
25 mg | ORAL_CAPSULE | Freq: Three times a day (TID) | ORAL | 2 refills | Status: DC | PRN
Start: 2019-02-23 — End: 2019-03-30

## 2019-02-23 NOTE — Progress Notes (Signed)
Consent: The patient and/or their healthcare decision maker is aware that they may receive a bill for this audio only encounter, depending on their insurance coverage, and has provided verbal consent to proceed: Yes.    INTERVAL HISTORY (Audio Only): Andrea [name] is a 73 year old widowed white female following up with me??4 weeks??since her last??appointment re: significant symptoms of Major Depression as well as Generalized Anxiety disorder. I had??stopped??the Effexor XR at the first appt (75 mg) due to severe noradrenergic SE's, but she became??worse??after that??with intense NM's and some increased emotional lability so we started Zoloft??but later changed to??Lexapro??due to increased anxiety.??Over 8 months ago??we stopped the Lexapro (and Baclofen) and went back to low-dose Effexor XR because it's the last med she felt better taking and she responded well, feeling "excellent" 5 months ago. However, she was then Dx'ed with AdenoCA of the lung and had a RUL lobectomy 7 weeks ago, and was consequently more dysphoric and anxious last time so encouraged her to increase her usage of the Lorazepam slightly, and restart therapy.  ??  She states that she's??been feeling "much better" than 4 weeks ago, and that much of this is due to her son and DIL visiting her, along with the grandkids. Her son is helping her to get the house ready to sell (will be moving to [place] near them), and her DIL is a psychologist and is helping her with some CBT techniques. She's learning to maintain a more positive mindset and she's also staying active and not having to rely on casual friends for support and socialization. She also feels better physically, as well. She's still using??the Ativan sparingly??and it does still help with the mild residual anxiety that she has at times. However, it seems too sedating at times so she'd like to try something milder--will begin with Vistaril at 25 mg to start.??No SE's with the Effexor or the Ativan  otherwise.??  ??  CURRENT MEDICATIONS:  1.????Effexor XR 37.5??mg daily  2. ??Lorazepam??0.5??mg prn--takes??0.5 mg sparingly still  ??  MENTAL STATUS EXAM:????Andrea [name]??is noted to be fairly pleasant and cooperative, and exhibited a fairly bright and euthymic mood. She was much more animated and denied having any??significant??neurovegetative dysfunction at this point. She also denied??any??SI or PDW thoughts??as well.??Her anxiety level was??much less??intense, and she denied having any manic or hypomanic symptoms either. There was no evidence of any psychosis such as hallucinations or delusional thinking,??and her thoughts were fairly organized and coherent. ??Her judgment and insight appear to be much better??now??and cognitively her fund of knowledge is above average. ??  ??  DIAGNOSTIC IMPRESSION:  AXIS I: ??Major Depression, recurrent, very mild??(F33.0)   ????????????????????????Generalized??Anxiety disorder--still stable??(F41.1).  AXIS II: ??Deferred--some??.  AXIS III: ??Lung CA ([place]); hypertension; gastroesophageal reflux disease; rotator cuff repair.  ??  PLAN:  1.????Continue the??Effexor XR at only 37.5 mg daily--has 3 months of??refills.  2.????Continue the Ativan at 0.5 mg??bid prn--has 2+ months of refills.  3.????Begin a trial on Vistaril at 25 mg tid prn (may increase to 50 mg/dose)--#60 with 2 refills.  4.  Continue the CBT practices she's been using.  4. ??She will follow up with me in??4-5??weeks, sooner if needed.    I affirm this is a Patient-Initiated-Episode with a patient who has not had a related appointment within my department in the past 7 days or scheduled within the next 24 hours.  Total Time: 18 minutes  Note: not billable if the call serves to triage the patient into an appointment for the relevant concern. Patient was evaluated  by phone call and had no access to a computer or smart phone. Chart reviewed. Evaluated and management per the note above. POS: patient at home; provider in office.

## 2019-02-23 NOTE — Progress Notes (Addendum)
Consent: The patient and/or their healthcare decision maker is aware that they may receive a bill for this audio only encounter, depending on their insurance coverage, and has provided verbal consent to proceed: Yes.    INTERVAL HISTORY (Audio Only): Andrea [name] is a 73 year old widowed white female following up with me??4 weeks??since her last??appointment re: significant symptoms of Major Depression as well as Generalized Anxiety disorder. I had??stopped??the Effexor XR at the first appt (75 mg) due to severe noradrenergic SE's, but she became??worse??after that??with intense NM's and some increased emotional lability so we started Zoloft??but later changed to??Lexapro??due to increased anxiety.??Over 8 months ago??we stopped the Lexapro (and Baclofen) and went back to low-dose Effexor XR because it's the last med she felt better taking and she responded well, feeling "excellent" 5 months ago. However, she was then Dx'ed with AdenoCA of the lung and had a RUL lobectomy 7 weeks ago, and was consequently more dysphoric and anxious last time so encouraged her to increase her usage of the Lorazepam slightly, and restart therapy.  ??  She states that she's??been feeling "much better" than 4 weeks ago, and that much of this is due to her son and DIL visiting her, along with the grandkids. Her son is helping her to get the house ready to sell (will be moving to [place] near them), and her DIL is a psychologist and is helping her with some CBT techniques. She's learning to maintain a more positive mindset and she's also staying active and not having to rely on casual friends for support and socialization. She also feels better physically, as well. She's still using??the Ativan sparingly??and it does still help with the mild residual anxiety that she has at times. However, it seems too sedating at times so she'd like to try something milder--will begin with Vistaril at 25 mg to start.??No SE's with the Effexor or the Ativan otherwise.??  ??   CURRENT MEDICATIONS:  1.????Effexor XR 37.5??mg daily  2. ??Lorazepam??0.5??mg prn--takes??0.5 mg sparingly still  ??  MENTAL STATUS EXAM:????Andrea [name]??is noted to be fairly pleasant and cooperative, and exhibited a fairly bright and euthymic mood. She was much more animated and denied having any??significant??neurovegetative dysfunction at this point. She also denied??any??SI or PDW thoughts??as well.??Her anxiety level was??much less??intense, and she denied having any manic or hypomanic symptoms either. There was no evidence of any psychosis such as hallucinations or delusional thinking,??and her thoughts were fairly organized and coherent. ??Her judgment and insight appear to be much better??now??and cognitively her fund of knowledge is above average. ??  ??  DIAGNOSTIC IMPRESSION:  AXIS I: ??Major Depression, recurrent, very mild??(F33.0)   ????????????????????????Generalized??Anxiety disorder--still stable??(F41.1).  AXIS II: ??Deferred--some??.  AXIS III: ??Lung CA ([place]); hypertension; gastroesophageal reflux disease; rotator cuff repair.  ??  PLAN:  1.????Continue the??Effexor XR at only 37.5 mg daily--has 3 months of??refills.  2.????Continue the Ativan at 0.5 mg??bid prn--has 2+ months of refills.  3.????Begin a trial on Vistaril at 25 mg tid prn (may increase to 50 mg/dose)--#60 with 2 refills.  4.  Continue the CBT practices she's been using.  4. ??She will follow up with me in??4-5??weeks, sooner if needed.    I affirm this is a Patient-Initiated-Episode with a patient who has not had a related appointment within my department in the past 7 days or scheduled within the next 24 hours.  Total Time: 18 minutes  Note: not billable if the call serves to triage the patient into an appointment for the relevant concern. Patient was evaluated  by phone call and had no access to a computer or smart phone. Chart reviewed. Evaluated and management per the note above. POS: patient at home; provider in office.

## 2019-03-02 NOTE — Telephone Encounter (Signed)
Spoke to Pt at length, she does not want to see Dr Levi Aland for personal reasons, she is wanting another GI MD in [facility] area.  She has called her PCP but has not heard back yet.  Given the name of Dr Vivi Barrack, but told her to confirm that is ok with her PCP.

## 2019-03-02 NOTE — Telephone Encounter (Signed)
Patient called and stated that she wants a disc with her images on it so she can "be prepared". Patient stated that she has colon cancer and the GI doctor wants to see her at [facility]. Patient stated that she wants to see someone in [place]. Informed patient that we do have a oncology facility in that area with Dr. Myrtie Neither. Patient stated that she would like to be seen there. Informed patient that someone from their office will call her to schedule an appointment. Patient verbalized understanding.   CB# [phone]

## 2019-03-03 ENCOUNTER — Inpatient Hospital Stay: Admit: 2019-03-03 | Payer: MEDICARE

## 2019-03-03 ENCOUNTER — Encounter

## 2019-03-03 DIAGNOSIS — M25711 Osteophyte, right shoulder: Secondary | ICD-10-CM

## 2019-03-03 NOTE — Telephone Encounter (Signed)
Spoke with Pt, advised Dr Myrtie Neither recommends Dr Earley Favor, which her PCP has also recommended.  Pt reports she will call for an appointment with Dr Myrtie Neither after she sees Dr Earley Favor.

## 2019-03-03 NOTE — Telephone Encounter (Signed)
Left message for Pt to call back

## 2019-03-10 ENCOUNTER — Ambulatory Visit: Attending: Surgery

## 2019-03-10 ENCOUNTER — Ambulatory Visit: Admit: 2019-03-10 | Discharge: 2019-03-10 | Attending: Surgery

## 2019-03-10 DIAGNOSIS — K629 Disease of anus and rectum, unspecified: Secondary | ICD-10-CM

## 2019-03-10 NOTE — Progress Notes (Signed)
1. Have you been to the ER, urgent care clinic since your last visit?  Hospitalized since your last visit?No    2. Have you seen or consulted any other health care providers outside of the [facility] since your last visit?  Include any pap smears or colon screening. Yes When: new pt

## 2019-03-10 NOTE — Progress Notes (Signed)
Andrea [name] is a 73 y.o. female who presents today with the following:  Chief Complaint   Patient presents with   ??? Rectal Mass       HPI    73 year old female who presents to [place] as a referral by Dr. Brand Males after having undergone a PET scan back in March of this year which showed a 3.4 cm left upper lobe pulmonary mass consistent with a malignancy as well as concentric focal increased tracer activity in the distal rectum that was suspicious for malignancy.  She underwent left upper lobe lobectomy back on April 10 via VATS and she presents now for possible colon evaluation.  She believes her last colonoscopy was back in either 2015 or 16 and thinks maybe they found polyps.  She denies any rectal bleeding.  She denies any change in her bowel habits.  She denies any change in the caliber of her stool.  She denies any significant abdominal pain.  She states that she has had a little bit of weight loss that she is attributed to her VATS surgery.  She does take Metamucil and sometimes feels like maybe she has an incomplete evacuation of her bowels but this is not frequent.    She stopped smoking in 1988.  She does drink alcohol although the amount has become less in the last year where she drinks maybe 1 or 2 beers a day.      Past Medical History:   Diagnosis Date   ??? Anxiety    ??? Arthritis    ??? Depression    ??? Essential hypertension    ??? Gallstone    ??? GERD (gastroesophageal reflux disease)    ??? Hemoptysis    ??? Lung cancer ([place]) 2020   ??? RBBB        Past Surgical History:   Procedure Laterality Date   ??? HX CATARACT REMOVAL Right 07/2018   ??? HX CHOLECYSTECTOMY  06/14/2014   ??? HX GI      COLONOSCOPY   ??? HX MOHS PROCEDURES  2011   ??? HX ORTHOPAEDIC Bilateral 1970`S    CARPAL TUNNEL    ??? HX POLYPECTOMY      X2   ??? HX ROTATOR CUFF REPAIR Right 08/2017   ??? HX ROTATOR CUFF REPAIR Left 2011   ??? HX TONSILLECTOMY  1962       Social History     Socioeconomic History   ??? Marital status: WIDOWED     Spouse name: Not on file    ??? Number of children: Not on file   ??? Years of education: Not on file   ??? Highest education level: Not on file   Occupational History   ??? Not on file   Social Needs   ??? Financial resource strain: Not on file   ??? Food insecurity     Worry: Not on file     Inability: Not on file   ??? Transportation needs     Medical: Not on file     Non-medical: Not on file   Tobacco Use   ??? Smoking status: Former Smoker     Packs/day: 1.00     Years: 34.00     Pack years: 34.00     Last attempt to quit: 1991     Years since quitting: 29.4   ??? Smokeless tobacco: Never Used   Substance and Sexual Activity   ??? Alcohol use: Yes     Alcohol/week: 7.0 standard drinks  Types: 7 Shots of liquor per week   ??? Drug use: Never   ??? Sexual activity: Not Currently   Lifestyle   ??? Physical activity     Days per week: Not on file     Minutes per session: Not on file   ??? Stress: Not on file   Relationships   ??? Social Product manager on phone: Not on file     Gets together: Not on file     Attends religious service: Not on file     Active member of club or organization: Not on file     Attends meetings of clubs or organizations: Not on file     Relationship status: Not on file   ??? Intimate partner violence     Fear of current or ex partner: Not on file     Emotionally abused: Not on file     Physically abused: Not on file     Forced sexual activity: Not on file   Other Topics Concern   ??? Not on file   Social History Narrative   ??? Not on file       Family History   Problem Relation Age of Onset   ??? Stroke Mother    ??? Hypertension Mother    ??? Stroke Father    ??? Arthritis-osteo Brother    ??? Cancer Brother    ??? Heart Disease Brother    ??? Kidney Disease Brother    ??? Bleeding Prob Son         factor 5   ??? Anesth Problems Neg Hx        No Known Allergies    Current Outpatient Medications   Medication Sig   ??? hydrOXYzine pamoate (VISTARIL) 25 mg capsule Take 1 Cap by mouth three (3) times daily as needed for Anxiety.   ??? LORazepam (ATIVAN) 0.5 mg  tablet Take 1 Tab by mouth two (2) times daily as needed for Anxiety. Max Daily Amount: 1 mg.   ??? venlafaxine-SR (EFFEXOR-XR) 37.5 mg capsule Take 1 Cap by mouth daily.   ??? senna-docusate (PERICOLACE) 8.6-50 mg per tablet Take 1 Tab by mouth daily.   ??? lisinopriL (PRINIVIL, ZESTRIL) 20 mg tablet Take 10 mg by mouth nightly.   ??? hydroCHLOROthiazide (HYDRODIURIL) 25 mg tablet Take 25 mg by mouth nightly.   ??? omeprazole (PRILOSEC) 10 mg capsule Take 10 mg by mouth nightly.   ??? prednisoLONE acetate (PRED FORTE) 1 % ophthalmic suspension Administer 1 Drop to right eye daily.   ??? hydrALAZINE (APRESOLINE) 50 mg tablet Take 50 mg by mouth two (2) times a day.   ??? atenolol (TENORMIN) 25 mg tablet Take 25 mg by mouth nightly.     No current facility-administered medications for this visit.        The above histories, medications and allergies have been reviewed.    Review of Systems   Gastrointestinal: Positive for heartburn.   Psychiatric/Behavioral: Positive for depression. The patient is nervous/anxious.        Visit Vitals  BP 122/41 (BP 1 Location: Left arm, BP Patient Position: Sitting)   Pulse (!) 54   Temp 97.1 ??F (36.2 ??C) (Temporal)   Resp 14   Ht 5\' 2"  (1.575 m)   Wt 164 lb (74.4 kg)   SpO2 96%   BMI 30.00 kg/m??     Physical Exam  Constitutional:       Appearance: Normal appearance.   Cardiovascular:  Rate and Rhythm: Normal rate and regular rhythm.   Pulmonary:      Effort: Pulmonary effort is normal.      Breath sounds: Normal breath sounds.   Abdominal:      General: There is no distension.      Palpations: Abdomen is soft. There is no mass.      Tenderness: There is no abdominal tenderness.   Neurological:      Mental Status: She is alert.         1. Rectal abnormality  Recommend colonoscopy.  The procedure was explained in detail including the risks and benefits.  Risks shared included risks of missed lesions, incomplete exam, colon injury or perforation.   Risks associated with anesthesia were also  discussed.  The patient wishes to proceed and we will schedule.    The patient was counseled at length about the risks of contracting Covid-19 during their perioperative period and any recovery window from their procedure.  The patient was made aware that contracting Covid-19  may worsen their prognosis for recovering from their procedure and lend to a higher morbidity and/or mortality risk.  All material risks, benefits, and reasonable alternatives including postponing the procedure were discussed. The patient does  wish to proceed with the procedure at this time.          Follow-up and Dispositions    ?? Return for post procedure.         Ilona Sorrel, MD

## 2019-03-10 NOTE — Progress Notes (Signed)
Andrea [name] is a 73 y.o. female who presents today with the following:  Chief Complaint   Patient presents with   ??? Rectal Mass       HPI    73 year old female who presents to [place] as a referral by Dr. Brand Males after having undergone a PET scan back in March of this year which showed a 3.4 cm left upper lobe pulmonary mass consistent with a malignancy as well as concentric focal increased tracer activity in the distal rectum that was suspicious for malignancy.  She underwent left upper lobe lobectomy back on April 10 via VATS and she presents now for possible colon evaluation.  She believes her last colonoscopy was back in either 2015 or 16 and thinks maybe they found polyps.  She denies any rectal bleeding.  She denies any change in her bowel habits.  She denies any change in the caliber of her stool.  She denies any significant abdominal pain.  She states that she has had a little bit of weight loss that she is attributed to her VATS surgery.  She does take Metamucil and sometimes feels like maybe she has an incomplete evacuation of her bowels but this is not frequent.    She stopped smoking in 1988.  She does drink alcohol although the amount has become less in the last year where she drinks maybe 1 or 2 beers a day.      Past Medical History:   Diagnosis Date   ??? Anxiety    ??? Arthritis    ??? Depression    ??? Essential hypertension    ??? Gallstone    ??? GERD (gastroesophageal reflux disease)    ??? Hemoptysis    ??? Lung cancer ([place]) 2020   ??? RBBB        Past Surgical History:   Procedure Laterality Date   ??? HX CATARACT REMOVAL Right 07/2018   ??? HX CHOLECYSTECTOMY  06/14/2014   ??? HX GI      COLONOSCOPY   ??? HX MOHS PROCEDURES  2011   ??? HX ORTHOPAEDIC Bilateral 1970`S    CARPAL TUNNEL    ??? HX POLYPECTOMY      X2   ??? HX ROTATOR CUFF REPAIR Right 08/2017   ??? HX ROTATOR CUFF REPAIR Left 2011   ??? HX TONSILLECTOMY  1962       Social History     Socioeconomic History   ??? Marital status: WIDOWED     Spouse name: Not on file    ??? Number of children: Not on file   ??? Years of education: Not on file   ??? Highest education level: Not on file   Occupational History   ??? Not on file   Social Needs   ??? Financial resource strain: Not on file   ??? Food insecurity     Worry: Not on file     Inability: Not on file   ??? Transportation needs     Medical: Not on file     Non-medical: Not on file   Tobacco Use   ??? Smoking status: Former Smoker     Packs/day: 1.00     Years: 34.00     Pack years: 34.00     Last attempt to quit: 1991     Years since quitting: 29.4   ??? Smokeless tobacco: Never Used   Substance and Sexual Activity   ??? Alcohol use: Yes     Alcohol/week: 7.0 standard drinks  Types: 7 Shots of liquor per week   ??? Drug use: Never   ??? Sexual activity: Not Currently   Lifestyle   ??? Physical activity     Days per week: Not on file     Minutes per session: Not on file   ??? Stress: Not on file   Relationships   ??? Social Product manager on phone: Not on file     Gets together: Not on file     Attends religious service: Not on file     Active member of club or organization: Not on file     Attends meetings of clubs or organizations: Not on file     Relationship status: Not on file   ??? Intimate partner violence     Fear of current or ex partner: Not on file     Emotionally abused: Not on file     Physically abused: Not on file     Forced sexual activity: Not on file   Other Topics Concern   ??? Not on file   Social History Narrative   ??? Not on file       Family History   Problem Relation Age of Onset   ??? Stroke Mother    ??? Hypertension Mother    ??? Stroke Father    ??? Arthritis-osteo Brother    ??? Cancer Brother    ??? Heart Disease Brother    ??? Kidney Disease Brother    ??? Bleeding Prob Son         factor 5   ??? Anesth Problems Neg Hx        No Known Allergies    Current Outpatient Medications   Medication Sig   ??? hydrOXYzine pamoate (VISTARIL) 25 mg capsule Take 1 Cap by mouth three (3) times daily as needed for Anxiety.    ??? LORazepam (ATIVAN) 0.5 mg tablet Take 1 Tab by mouth two (2) times daily as needed for Anxiety. Max Daily Amount: 1 mg.   ??? venlafaxine-SR (EFFEXOR-XR) 37.5 mg capsule Take 1 Cap by mouth daily.   ??? senna-docusate (PERICOLACE) 8.6-50 mg per tablet Take 1 Tab by mouth daily.   ??? lisinopriL (PRINIVIL, ZESTRIL) 20 mg tablet Take 10 mg by mouth nightly.   ??? hydroCHLOROthiazide (HYDRODIURIL) 25 mg tablet Take 25 mg by mouth nightly.   ??? omeprazole (PRILOSEC) 10 mg capsule Take 10 mg by mouth nightly.   ??? prednisoLONE acetate (PRED FORTE) 1 % ophthalmic suspension Administer 1 Drop to right eye daily.   ??? hydrALAZINE (APRESOLINE) 50 mg tablet Take 50 mg by mouth two (2) times a day.   ??? atenolol (TENORMIN) 25 mg tablet Take 25 mg by mouth nightly.     No current facility-administered medications for this visit.        The above histories, medications and allergies have been reviewed.    Review of Systems   Gastrointestinal: Positive for heartburn.   Psychiatric/Behavioral: Positive for depression. The patient is nervous/anxious.        Visit Vitals  BP 122/41 (BP 1 Location: Left arm, BP Patient Position: Sitting)   Pulse (!) 54   Temp 97.1 ??F (36.2 ??C) (Temporal)   Resp 14   Ht 5\' 2"  (1.575 m)   Wt 164 lb (74.4 kg)   SpO2 96%   BMI 30.00 kg/m??     Physical Exam  Constitutional:       Appearance: Normal appearance.   Cardiovascular:  Rate and Rhythm: Normal rate and regular rhythm.   Pulmonary:      Effort: Pulmonary effort is normal.      Breath sounds: Normal breath sounds.   Abdominal:      General: There is no distension.      Palpations: Abdomen is soft. There is no mass.      Tenderness: There is no abdominal tenderness.   Neurological:      Mental Status: She is alert.         1. Rectal abnormality  Recommend colonoscopy.  The procedure was explained in detail including the risks and benefits.  Risks shared included risks of missed lesions, incomplete exam, colon injury or perforation.   Risks associated with  anesthesia were also discussed.  The patient wishes to proceed and we will schedule.    The patient was counseled at length about the risks of contracting Covid-19 during their perioperative period and any recovery window from their procedure.  The patient was made aware that contracting Covid-19  may worsen their prognosis for recovering from their procedure and lend to a higher morbidity and/or mortality risk.  All material risks, benefits, and reasonable alternatives including postponing the procedure were discussed. The patient does  wish to proceed with the procedure at this time.          Follow-up and Dispositions    ?? Return for post procedure.         Ilona Sorrel, MD

## 2019-03-10 NOTE — Patient Instructions (Signed)
Learning About Colonoscopy  What is a colonoscopy?     A colonoscopy is a test (also called a procedure) that lets a doctor look inside your large intestine. The doctor uses a thin, lighted tube called a colonoscope. The doctor uses it to look for small growths called polyps, colon or rectal cancer (colorectal cancer), or other problems like bleeding.  During the procedure, the doctor can take samples of tissue. The samples can then be checked for cancer or other conditions. The doctor can also take out polyps.  How is a colonoscopy done?  This procedure is done in a doctor's office or a clinic or hospital. You will get medicine to help you relax and not feel pain. Some people find that they don't remember having the test because of the medicine.  The doctor gently moves the colonoscope, or scope, through the colon. The scope is also a small video camera. It lets the doctor see the colon and take pictures.  How do you prepare for the procedure?  You need to clean out your colon before the procedure so the doctor can see all of your colon. This process may start a day or two before the test. This depends on which "colon prep" your doctor recommends.  To clean your colon, you stop eating solid foods and drink only clear liquids. You can have water, tea, coffee, clear juices, clear broths, flavored ice pops, and gelatin (such as Jell-O). Do not drink anything red or purple.  The day or night before the procedure, you drink a large amount of a special liquid. This causes loose, frequent stools. You will go to the bathroom a lot. It's very important to drink all of the liquid. If you have problems drinking it, call your doctor.  Some people don't go to work or do their usual activities on the day of the prep.  Arrange to have someone take you home after the test.  What can you expect after a colonoscopy?  Your doctor will tell you when you can eat and do your usual activities.   Drink a lot of fluid after the test to replace the fluids you may have lost during the colon prep. But don't drink alcohol.  Your doctor will talk to you about when you'll need your next colonoscopy. The results of your test and your risk for colorectal cancer will help your doctor decide how often you need to be checked.  After the test, you may be bloated or have gas pains. You may need to pass gas. If a biopsy was done or a polyp was removed, you may have streaks of blood in your stool (feces) for a few days. If polyps were taken out, your doctor may tell you to avoid taking aspirin and nonsteroidal anti-inflammatory drugs (NSAIDs) for 7 to 14 days.  Problems such as heavy rectal bleeding may not occur until several weeks after the test. This isn't common. But it can happen after polyps are removed.  Follow-up care is a key part of your treatment and safety. Be sure to make and go to all appointments, and call your doctor if you are having problems. It's also a good idea to know your test results and keep a list of the medicines you take.  Where can you learn more?  Go to [employer]  Enter [phone] in the search box to learn more about "Learning About Colonoscopy."  Current as of: May 21, 2018??????????????????????????????Content Version: 12.5  ?? 2006-2020 [employer].  Care instructions adapted under license by [employer] (which disclaims liability or warranty for this information). If you have questions about a medical condition or this instruction, always ask your healthcare professional. [place] any warranty or liability for your use of this information.

## 2019-03-10 NOTE — Progress Notes (Signed)
1. Have you been to the ER, urgent care clinic since your last visit?  Hospitalized since your last visit?No    2. Have you seen or consulted any other health care providers outside of the [facility] since your last visit?  Include any pap smears or colon screening. Yes When: new pt

## 2019-03-16 NOTE — Anesthesia Pre-Procedure Evaluation (Signed)
Relevant Problems   No relevant active problems       Anesthetic History   No history of anesthetic complications            Review of Systems / Medical History  Patient summary reviewed, nursing notes reviewed and pertinent labs reviewed    Pulmonary          Smoker (former)      Comments: S/p LU lobectomy   Neuro/Psych         Psychiatric history (depression)     Cardiovascular    Hypertension        Dysrhythmias (RBBB)            GI/Hepatic/Renal     GERD           Endo/Other        Cancer (lung)     Other Findings              Physical Exam    Airway  Mallampati: II  TM Distance: 4 - 6 cm  Neck ROM: normal range of motion        Cardiovascular    Rhythm: regular  Rate: normal         Dental         Pulmonary  Breath sounds clear to auscultation               Abdominal         Other Findings            Anesthetic Plan    ASA: 3  Anesthesia type: MAC          Induction: Intravenous  Anesthetic plan and risks discussed with: Patient

## 2019-03-16 NOTE — Anesthesia Pre-Procedure Evaluation (Addendum)
Relevant Problems   No relevant active problems       Anesthetic History   No history of anesthetic complications            Review of Systems / Medical History  Patient summary reviewed, nursing notes reviewed and pertinent labs reviewed    Pulmonary          Smoker (former)      Comments: S/p LU lobectomy   Neuro/Psych         Psychiatric history (depression)     Cardiovascular    Hypertension        Dysrhythmias (RBBB)            GI/Hepatic/Renal     GERD           Endo/Other        Cancer (lung)     Other Findings              Physical Exam    Airway  Mallampati: II  TM Distance: 4 - 6 cm  Neck ROM: normal range of motion        Cardiovascular    Rhythm: regular  Rate: normal         Dental         Pulmonary  Breath sounds clear to auscultation               Abdominal         Other Findings            Anesthetic Plan    ASA: 3  Anesthesia type: MAC          Induction: Intravenous  Anesthetic plan and risks discussed with: Patient

## 2019-03-25 ENCOUNTER — Encounter

## 2019-03-25 ENCOUNTER — Ambulatory Visit

## 2019-03-26 ENCOUNTER — Inpatient Hospital Stay: Admit: 2019-03-26 | Payer: MEDICARE

## 2019-03-26 DIAGNOSIS — Z01818 Encounter for other preprocedural examination: Secondary | ICD-10-CM

## 2019-03-28 LAB — SARS-COV-2: SARS-CoV-2: NOT DETECTED

## 2019-03-28 LAB — SARS-COV-2, PCR: SARS-CoV-2 by PCR: NOT DETECTED

## 2019-03-29 NOTE — H&P (Signed)
Andrea [name] is a 73 y.o. female who presents today with the following:  No chief complaint on file.      HPI    73 year old female who presents to [place] as a referral by Dr. Brand Males after having undergone a PET scan back in March of this year which showed a 3.4 cm left upper lobe pulmonary mass consistent with a malignancy as well as concentric focal increased tracer activity in the distal rectum that was suspicious for malignancy.  She underwent left upper lobe lobectomy back on April 10 via VATS and she presents now for possible colon evaluation.  She believes her last colonoscopy was back in either 2015 or 16 and thinks maybe they found polyps.  She denies any rectal bleeding.  She denies any change in her bowel habits.  She denies any change in the caliber of her stool.  She denies any significant abdominal pain.  She states that she has had a little bit of weight loss that she is attributed to her VATS surgery.  She does take Metamucil and sometimes feels like maybe she has an incomplete evacuation of her bowels but this is not frequent.    She stopped smoking in 1988.  She does drink alcohol although the amount has become less in the last year where she drinks maybe 1 or 2 beers a day.      Past Medical History:   Diagnosis Date   ??? Anxiety    ??? Arthritis    ??? Depression    ??? Essential hypertension    ??? Gallstone    ??? GERD (gastroesophageal reflux disease)    ??? Hemoptysis    ??? Lung cancer ([place]) 2020   ??? RBBB        Past Surgical History:   Procedure Laterality Date   ??? HX CATARACT REMOVAL Right 07/2018   ??? HX CHOLECYSTECTOMY  06/14/2014   ??? HX GI      COLONOSCOPY   ??? HX MOHS PROCEDURES  2011   ??? HX ORTHOPAEDIC Bilateral 1970`S    CARPAL TUNNEL    ??? HX POLYPECTOMY      X2   ??? HX ROTATOR CUFF REPAIR Right 08/2017   ??? HX ROTATOR CUFF REPAIR Left 2011   ??? HX TONSILLECTOMY  1962       Social History     Socioeconomic History   ??? Marital status: WIDOWED     Spouse name: Not on file   ??? Number of children: Not on  file   ??? Years of education: Not on file   ??? Highest education level: Not on file   Occupational History   ??? Not on file   Social Needs   ??? Financial resource strain: Not on file   ??? Food insecurity     Worry: Not on file     Inability: Not on file   ??? Transportation needs     Medical: Not on file     Non-medical: Not on file   Tobacco Use   ??? Smoking status: Former Smoker     Packs/day: 1.00     Years: 34.00     Pack years: 34.00     Last attempt to quit: 1991     Years since quitting: 29.5   ??? Smokeless tobacco: Never Used   Substance and Sexual Activity   ??? Alcohol use: Yes     Alcohol/week: 7.0 standard drinks     Types: 7 Shots of liquor per  week   ??? Drug use: Never   ??? Sexual activity: Not Currently   Lifestyle   ??? Physical activity     Days per week: Not on file     Minutes per session: Not on file   ??? Stress: Not on file   Relationships   ??? Social Product manager on phone: Not on file     Gets together: Not on file     Attends religious service: Not on file     Active member of club or organization: Not on file     Attends meetings of clubs or organizations: Not on file     Relationship status: Not on file   ??? Intimate partner violence     Fear of current or ex partner: Not on file     Emotionally abused: Not on file     Physically abused: Not on file     Forced sexual activity: Not on file   Other Topics Concern   ??? Not on file   Social History Narrative   ??? Not on file       Family History   Problem Relation Age of Onset   ??? Stroke Mother    ??? Hypertension Mother    ??? Stroke Father    ??? Arthritis-osteo Brother    ??? Cancer Brother    ??? Heart Disease Brother    ??? Kidney Disease Brother    ??? Bleeding Prob Son         factor 5   ??? Anesth Problems Neg Hx        No Known Allergies    No current facility-administered medications for this encounter.      Current Outpatient Medications   Medication Sig   ??? Methylcellulose, with Sugar, (Citrucel, sucrose,) powd Take  by mouth daily.   ??? ibuprofen 200 mg cap  Take  by mouth as needed for Pain.   ??? LORazepam (ATIVAN) 0.5 mg tablet Take 1 Tab by mouth two (2) times daily as needed for Anxiety. Max Daily Amount: 1 mg.   ??? venlafaxine-SR (EFFEXOR-XR) 37.5 mg capsule Take 1 Cap by mouth daily.   ??? lisinopriL (PRINIVIL, ZESTRIL) 20 mg tablet Take 10 mg by mouth nightly.   ??? hydroCHLOROthiazide (HYDRODIURIL) 25 mg tablet Take 25 mg by mouth nightly.   ??? omeprazole (PRILOSEC) 10 mg capsule Take 10 mg by mouth nightly.   ??? hydrALAZINE (APRESOLINE) 50 mg tablet Take 50 mg by mouth two (2) times a day.   ??? atenolol (TENORMIN) 25 mg tablet Take 25 mg by mouth nightly.   ??? hydrOXYzine pamoate (VISTARIL) 25 mg capsule Take 1 Cap by mouth three (3) times daily as needed for Anxiety.   ??? senna-docusate (PERICOLACE) 8.6-50 mg per tablet Take 1 Tab by mouth daily.   ??? prednisoLONE acetate (PRED FORTE) 1 % ophthalmic suspension Administer 1 Drop to right eye daily.       The above histories, medications and allergies have been reviewed.    Review of Systems   Gastrointestinal: Positive for heartburn.   Psychiatric/Behavioral: Positive for depression. The patient is nervous/anxious.        Visit Vitals  Ht 5\' 2"  (1.575 m)   Wt 150 lb (68 kg)   BMI 27.44 kg/m??     Physical Exam  Constitutional:       Appearance: Normal appearance.   Cardiovascular:      Rate and Rhythm: Normal rate and regular  rhythm.   Pulmonary:      Effort: Pulmonary effort is normal.      Breath sounds: Normal breath sounds.   Abdominal:      General: There is no distension.      Palpations: Abdomen is soft. There is no mass.      Tenderness: There is no abdominal tenderness.   Neurological:      Mental Status: She is alert.         1. Rectal abnormality  Recommend colonoscopy.  The procedure was explained in detail including the risks and benefits.  Risks shared included risks of missed lesions, incomplete exam, colon injury or perforation.   Risks associated with anesthesia were also discussed.  The patient wishes to  proceed and we will schedule.    The patient was counseled at length about the risks of contracting Covid-19 during their perioperative period and any recovery window from their procedure.  The patient was made aware that contracting Covid-19  may worsen their prognosis for recovering from their procedure and lend to a higher morbidity and/or mortality risk.  All material risks, benefits, and reasonable alternatives including postponing the procedure were discussed. The patient does  wish to proceed with the procedure at this time.              Ilona Sorrel, MD

## 2019-03-29 NOTE — H&P (Signed)
Andrea [name] is a 73 y.o. female who presents today with the following:  No chief complaint on file.      HPI    73 year old female who presents to [place] as a referral by Dr. Brand Males after having undergone a PET scan back in March of this year which showed a 3.4 cm left upper lobe pulmonary mass consistent with a malignancy as well as concentric focal increased tracer activity in the distal rectum that was suspicious for malignancy.  She underwent left upper lobe lobectomy back on April 10 via VATS and she presents now for possible colon evaluation.  She believes her last colonoscopy was back in either 2015 or 16 and thinks maybe they found polyps.  She denies any rectal bleeding.  She denies any change in her bowel habits.  She denies any change in the caliber of her stool.  She denies any significant abdominal pain.  She states that she has had a little bit of weight loss that she is attributed to her VATS surgery.  She does take Metamucil and sometimes feels like maybe she has an incomplete evacuation of her bowels but this is not frequent.    She stopped smoking in 1988.  She does drink alcohol although the amount has become less in the last year where she drinks maybe 1 or 2 beers a day.      Past Medical History:   Diagnosis Date   ??? Anxiety    ??? Arthritis    ??? Depression    ??? Essential hypertension    ??? Gallstone    ??? GERD (gastroesophageal reflux disease)    ??? Hemoptysis    ??? Lung cancer ([place]) 2020   ??? RBBB        Past Surgical History:   Procedure Laterality Date   ??? HX CATARACT REMOVAL Right 07/2018   ??? HX CHOLECYSTECTOMY  06/14/2014   ??? HX GI      COLONOSCOPY   ??? HX MOHS PROCEDURES  2011   ??? HX ORTHOPAEDIC Bilateral 1970`S    CARPAL TUNNEL    ??? HX POLYPECTOMY      X2   ??? HX ROTATOR CUFF REPAIR Right 08/2017   ??? HX ROTATOR CUFF REPAIR Left 2011   ??? HX TONSILLECTOMY  1962       Social History     Socioeconomic History   ??? Marital status: WIDOWED     Spouse name: Not on file    ??? Number of children: Not on file   ??? Years of education: Not on file   ??? Highest education level: Not on file   Occupational History   ??? Not on file   Social Needs   ??? Financial resource strain: Not on file   ??? Food insecurity     Worry: Not on file     Inability: Not on file   ??? Transportation needs     Medical: Not on file     Non-medical: Not on file   Tobacco Use   ??? Smoking status: Former Smoker     Packs/day: 1.00     Years: 34.00     Pack years: 34.00     Last attempt to quit: 1991     Years since quitting: 29.5   ??? Smokeless tobacco: Never Used   Substance and Sexual Activity   ??? Alcohol use: Yes     Alcohol/week: 7.0 standard drinks     Types: 7 Shots of liquor per  week   ??? Drug use: Never   ??? Sexual activity: Not Currently   Lifestyle   ??? Physical activity     Days per week: Not on file     Minutes per session: Not on file   ??? Stress: Not on file   Relationships   ??? Social Product manager on phone: Not on file     Gets together: Not on file     Attends religious service: Not on file     Active member of club or organization: Not on file     Attends meetings of clubs or organizations: Not on file     Relationship status: Not on file   ??? Intimate partner violence     Fear of current or ex partner: Not on file     Emotionally abused: Not on file     Physically abused: Not on file     Forced sexual activity: Not on file   Other Topics Concern   ??? Not on file   Social History Narrative   ??? Not on file       Family History   Problem Relation Age of Onset   ??? Stroke Mother    ??? Hypertension Mother    ??? Stroke Father    ??? Arthritis-osteo Brother    ??? Cancer Brother    ??? Heart Disease Brother    ??? Kidney Disease Brother    ??? Bleeding Prob Son         factor 5   ??? Anesth Problems Neg Hx        No Known Allergies    No current facility-administered medications for this encounter.      Current Outpatient Medications   Medication Sig   ??? Methylcellulose, with Sugar, (Citrucel, sucrose,) powd Take  by mouth  daily.   ??? ibuprofen 200 mg cap Take  by mouth as needed for Pain.   ??? LORazepam (ATIVAN) 0.5 mg tablet Take 1 Tab by mouth two (2) times daily as needed for Anxiety. Max Daily Amount: 1 mg.   ??? venlafaxine-SR (EFFEXOR-XR) 37.5 mg capsule Take 1 Cap by mouth daily.   ??? lisinopriL (PRINIVIL, ZESTRIL) 20 mg tablet Take 10 mg by mouth nightly.   ??? hydroCHLOROthiazide (HYDRODIURIL) 25 mg tablet Take 25 mg by mouth nightly.   ??? omeprazole (PRILOSEC) 10 mg capsule Take 10 mg by mouth nightly.   ??? hydrALAZINE (APRESOLINE) 50 mg tablet Take 50 mg by mouth two (2) times a day.   ??? atenolol (TENORMIN) 25 mg tablet Take 25 mg by mouth nightly.   ??? hydrOXYzine pamoate (VISTARIL) 25 mg capsule Take 1 Cap by mouth three (3) times daily as needed for Anxiety.   ??? senna-docusate (PERICOLACE) 8.6-50 mg per tablet Take 1 Tab by mouth daily.   ??? prednisoLONE acetate (PRED FORTE) 1 % ophthalmic suspension Administer 1 Drop to right eye daily.       The above histories, medications and allergies have been reviewed.    Review of Systems   Gastrointestinal: Positive for heartburn.   Psychiatric/Behavioral: Positive for depression. The patient is nervous/anxious.        Visit Vitals  Ht 5\' 2"  (1.575 m)   Wt 150 lb (68 kg)   BMI 27.44 kg/m??     Physical Exam  Constitutional:       Appearance: Normal appearance.   Cardiovascular:      Rate and Rhythm: Normal rate and regular  rhythm.   Pulmonary:      Effort: Pulmonary effort is normal.      Breath sounds: Normal breath sounds.   Abdominal:      General: There is no distension.      Palpations: Abdomen is soft. There is no mass.      Tenderness: There is no abdominal tenderness.   Neurological:      Mental Status: She is alert.         1. Rectal abnormality  Recommend colonoscopy.  The procedure was explained in detail including the risks and benefits.  Risks shared included risks of missed lesions, incomplete exam, colon injury or perforation.   Risks associated with  anesthesia were also discussed.  The patient wishes to proceed and we will schedule.    The patient was counseled at length about the risks of contracting Covid-19 during their perioperative period and any recovery window from their procedure.  The patient was made aware that contracting Covid-19  may worsen their prognosis for recovering from their procedure and lend to a higher morbidity and/or mortality risk.  All material risks, benefits, and reasonable alternatives including postponing the procedure were discussed. The patient does  wish to proceed with the procedure at this time.              Ilona Sorrel, MD

## 2019-03-30 ENCOUNTER — Inpatient Hospital Stay: Payer: MEDICARE

## 2019-03-30 ENCOUNTER — Inpatient Hospital Stay: Admit: 2019-03-30 | Payer: MEDICARE | Attending: Psychiatry

## 2019-03-30 DIAGNOSIS — F411 Generalized anxiety disorder: Secondary | ICD-10-CM

## 2019-03-30 MED ORDER — EPHEDRINE (PF) 50 MG/5 ML (10 MG/ML) IN NS IV SYRINGE
50 mg/5 mL (10 mg/mL) | INTRAVENOUS | Status: DC | PRN
Start: 2019-03-30 — End: 2019-03-30
  Administered 2019-03-30: 15:00:00 via INTRAVENOUS

## 2019-03-30 MED ORDER — ESCITALOPRAM 5 MG TAB
5 mg | ORAL_TABLET | Freq: Every day | ORAL | 1 refills | Status: DC
Start: 2019-03-30 — End: 2019-05-11

## 2019-03-30 MED ORDER — SODIUM CHLORIDE 0.9 % IJ SYRG
Freq: Three times a day (TID) | INTRAMUSCULAR | Status: DC
Start: 2019-03-30 — End: 2019-03-30

## 2019-03-30 MED ORDER — VENLAFAXINE SR 37.5 MG 24 HR CAP
37.5 mg | ORAL_CAPSULE | Freq: Every day | ORAL | 5 refills | Status: DC
Start: 2019-03-30 — End: 2019-08-02

## 2019-03-30 MED ORDER — SODIUM CHLORIDE 0.9 % IJ SYRG
INTRAMUSCULAR | Status: DC | PRN
Start: 2019-03-30 — End: 2019-03-30

## 2019-03-30 MED ORDER — PROPOFOL 10 MG/ML IV EMUL
10 mg/mL | INTRAVENOUS | Status: DC | PRN
Start: 2019-03-30 — End: 2019-03-30
  Administered 2019-03-30 (×5): via INTRAVENOUS

## 2019-03-30 MED ORDER — GLYCOPYRROLATE 0.2 MG/ML IJ SOLN
0.2 mg/mL | INTRAMUSCULAR | Status: DC | PRN
Start: 2019-03-30 — End: 2019-03-30
  Administered 2019-03-30: 15:00:00 via INTRAVENOUS

## 2019-03-30 MED ORDER — LORAZEPAM 0.5 MG TAB
0.5 mg | ORAL_TABLET | Freq: Two times a day (BID) | ORAL | 2 refills | Status: DC | PRN
Start: 2019-03-30 — End: 2019-07-19

## 2019-03-30 MED ORDER — PROPOFOL 10 MG/ML IV EMUL
10 mg/mL | INTRAVENOUS | Status: AC
Start: 2019-03-30 — End: ?

## 2019-03-30 MED ORDER — EPHEDRINE (PF) 50 MG/5 ML (10 MG/ML) IN NS IV SYRINGE
50 mg/5 mL (10 mg/mL) | INTRAVENOUS | Status: AC
Start: 2019-03-30 — End: ?

## 2019-03-30 MED ORDER — LACTATED RINGERS IV
INTRAVENOUS | Status: DC
Start: 2019-03-30 — End: 2019-03-30
  Administered 2019-03-30: 13:00:00 via INTRAVENOUS

## 2019-03-30 MED FILL — LACTATED RINGERS IV: INTRAVENOUS | Qty: 1000

## 2019-03-30 MED FILL — NORMAL SALINE FLUSH 0.9 % INJECTION SYRINGE: INTRAMUSCULAR | Qty: 40

## 2019-03-30 MED FILL — EPHEDRINE 50 MG/5 ML (10 MG/ML) IN NS IV SYRINGE: 50 mg/5 mL (10 mg/mL) | INTRAVENOUS | Qty: 5

## 2019-03-30 MED FILL — DIPRIVAN 10 MG/ML INTRAVENOUS EMULSION: 10 mg/mL | INTRAVENOUS | Qty: 20

## 2019-03-30 NOTE — Progress Notes (Signed)
Consent: The patient and/or their healthcare decision maker is aware that they may receive a bill for this audio only encounter, depending on their insurance coverage, and has provided verbal consent to proceed: Yes.  ??  INTERVAL HISTORY??(Audio Only): Mrs.??Ahlers is a 73 year old widowed white female following up with me??5 weeks??since her last??appointment re: significant symptoms of Major Depression as well as Generalized Anxiety disorder. I had??stopped??the Effexor XR at the first appt (75 mg) due to severe noradrenergic SE's, but she became??worse??after that??with intense NM's and some increased emotional lability so we started Zoloft??but later changed to??Lexapro??due to increased anxiety.??Over 9??months ago??we stopped the Lexapro (and Baclofen) and went back to low-dose Effexor XR because it's the last med she felt better taking and she responded well, feeling "excellent" shortly after that. However, she was then Dx'ed with AdenoCA of the lung and had a RUL lobectomy 12 weeks ago, and has been more dysphoric and anxious (initially), but much better the last 2 times. She's had plenty of residual anxiety but the Ativan tends to make her drowsy. Last time we tried to use Vistaril but it's also been overly sedating  ??  She??states that??she's??been feeling "pretty good" overall affectively, even with all of the stressors that she's dealing with. However, she couldn't tolerate the Vistaril due to more sedation than she has with the Ativan. She feels she's using the Ativan reasonably effectively although the 0.5 mg dose still makes her slightly drowsy and the 0.25 mg dose isn't as effective. She'd like to retry the Lexapro to see if it'll help with the residual anxiety and muscle tension that causes regular HA's and some back tightness. Her son is still helping her to get the house ready to sell (will be moving to [place] near them), and her DIL is a psychologist and had been helping her with some CBT techniques. She's still  trying to maintain a more positive mindset and she's also staying active. Will add Lexapro at only 5 mg and see if it helps, watching closely for any SE's  ??  CURRENT MEDICATIONS:  1.????Effexor XR 37.5??mg daily  2. ??Lorazepam??0.5??mg prn--takes??0.5 mg sparingly still  3.   25 mg tid prn--stopped due to sedation  ??  MENTAL STATUS EXAM:????Elide??was noted to be fairly??pleasant and cooperative, and exhibited a fairly bright and euthymic mood. She was fairly animated and still denied having any??significant??neurovegetative dysfunction presently. She also denied??any??SI or PDW thoughts??as well.??Her anxiety level was??mild overall, but still causing some physical sx's. She denied having any manic or hypomanic symptoms at all. There was no evidence of any psychosis such as hallucinations or delusional thinking,??and her thoughts were fairly organized and coherent. ??Her judgment and insight appear to be much better??now??and cognitively her fund of knowledge is above average. ??  ??  DIAGNOSTIC IMPRESSION:  AXIS I: ??Major Depression, recurrent, very mild??(F33.0)   ????????????????????????Generalized??Anxiety disorder--still stable??(F41.1).  AXIS II: ??Deferred--some??.  AXIS III: ??Lung CA ([place]); hypertension; gastroesophageal reflux disease; rotator cuff repair.  ??  PLAN:  1.????Continue the??Effexor XR at only 37.5 mg daily--#30 with 5??refills.  2.????Continue??the Ativan at??0.5 mg??bid??prn--#60 with 2 refills.  3.????Begin a trial on Lexapro at 5 mg daily--#30 with 1 refill. Watch out for any SE's that may emerge.  4.  Continue the CBT practices she's been using.  4. ??She will follow up with me in??6??weeks, sooner if needed.  ??  I affirm this is a Patient-Initiated-Episode with a patient who has not had a related appointment within my department in the past 7  days or scheduled within the next 24 hours.  Total Time: 17 minutes  Note: not billable if the call serves to triage the patient into an appointment for the relevant concern. Patient was evaluated  by phone call and had no access to a computer or smart phone. Chart reviewed. Evaluated and management per the note above. POS: patient at home; provider in office.

## 2019-03-30 NOTE — Anesthesia Post-Procedure Evaluation (Signed)
Procedure(s):  COLONOSCOPY.    MAC    Anesthesia Post Evaluation      Multimodal analgesia: multimodal analgesia used between 6 hours prior to anesthesia start to PACU discharge  Patient location during evaluation: PACU  Patient participation: waiting for patient participation  Level of consciousness: sleepy but conscious  Pain score: 0  Pain management: adequate  Airway patency: patent  Anesthetic complications: no  Cardiovascular status: stable  Respiratory status: nasal cannula  Hydration status: acceptable  Post anesthesia nausea and vomiting:  none  Final Post Anesthesia Temperature Assessment:  Normothermia (36.0-37.5 degrees C)      INITIAL Post-op Vital signs:   Vitals Value Taken Time   BP 143/66 03/30/2019 10:50 AM   Temp     Pulse 84 03/30/2019 10:51 AM   Resp 18 03/30/2019 10:50 AM   SpO2 98 % 03/30/2019 10:51 AM   Vitals shown include unvalidated device data.

## 2019-03-30 NOTE — Op Note (Signed)
Brief Postoperative Note    Patient: JAMIEKA ROYLE  Date of Birth: Feb 15, 1946  MRN: [id number]    Date of Procedure: 03/30/2019     Pre-Op Diagnosis: Rectal abnormality [K62.9]    Post-Op Diagnosis: no significant rectal lesion seen      Procedure(s):  COLONOSCOPY    Surgeon(s):  Ilona Sorrel, MD    Surgical Assistant: None    Anesthesia: MAC     Estimated Blood Loss (mL): Minimal    Complications: None    Specimens:   ID Type Source Tests Collected by Time Destination   1 : cecal polyp Preservative Cecum  Ilona Sorrel, MD 03/30/2019 1037 Pathology   2 : polyp @ 69 cm Preservative Colon  Ilona Sorrel, MD 03/30/2019 1044 Pathology   3 : rectal polyp Preservative Rectal  Ilona Sorrel, MD 03/30/2019 1045 Pathology        Implants: * No implants in log *    Drains: * No LDAs found *    Findings: 1. Cecal polyp 2. Polyp at 65 cm  3. Rectal polyp  4. Diverticulosis 5. No rectal mass identified     Electronically Signed by Ilona Sorrel, MD on 03/30/2019 at 10:51 AM

## 2019-03-30 NOTE — Op Note (Signed)
[facility]  OPERATIVE REPORT    Name:  Andrea [name], Andrea [name]  MR#:  [id number]  DOB:  01-May-1946  ACCOUNT #:  [id number]  DATE OF SERVICE:  03/30/2019    PREOPERATIVE DIAGNOSIS:  Rectal lesion identified by PET scan in March.    POSTOPERATIVE DIAGNOSIS:  No rectal lesion identified.    PROCEDURE PERFORMED:  Colonoscopy with cold biopsy polypectomy x3.    SURGEON:  Esmond Plants, MD    ASSISTANT: None    ANESTHESIA:  MAC.    COMPLICATIONS: None.    SPECIMENS REMOVED:  1.  Cecal polyp.  2.  Polyp at 65 cm.  3.  Rectal polyp.    IMPLANTS: None.    ESTIMATED BLOOD LOSS:  Minimal.    FINDINGS:  1.  Cecal polyp.  2.  Polyp at 65 cm.  3.  Rectal polyp.  4.  Sigmoid diverticulosis.  5.  No rectal mass identified.    DISPOSITION:  Stable.    PROCEDURE:  The patient was brought to the operative theater.  Monitoring devices and nasal cannula O2 were placed per Anesthesia and the patient was placed in the left side down decubitus position.  IV sedation was administered and a time-out was performed.  Digital rectal exam was performed which did not reveal any specific lesions.  A well-lubricated Olympus colonoscope was introduced in the patient's rectum and advanced to the cecum.  The cecum was identified by identification of the appendiceal orifice and the ileocecal valve.  The prep was adequate.  A small polypoid cecal lesion was identified and removed in its entirety by cold biopsy forceps.  A second lesion at 65 cm was found and removed in a similar fashion.  There was a small rectal polyp that was less than 3-mm and it was removed in a similar fashion.  The sigmoid colon showed diverticulosis.  We paid particular attention in the rectum because of the findings of PET scan and there was no evidence of any lesion in the rectum or in the sigmoid colon that would explain the findings on PET CT.  The scope was carefully withdrawn.  The patient tolerated the procedure well and was transferred to PACU in stable  condition.      Esmond Plants, MD      [id number]  D:  03/30/2019 11:06  T:  03/30/2019 11:30  JOB #:  [id number]  CC:  June B Daffeh, MD

## 2019-03-30 NOTE — Interval H&P Note (Signed)
Update History & Physical    The Patient's History and Physical of March 29, 2019 was reviewed with the patient and I examined the patient.  There was no change.  The surgical site was confirmed by the patient and me.    Plan:  The risk, benefits, expected outcome, and alternative to the recommended procedure have been discussed with the patient.  Patient understands and wants to proceed with the procedure.    Electronically signed by Ilona Sorrel, MD on 03/30/2019 at 10:09 AM

## 2019-03-30 NOTE — Anesthesia Post-Procedure Evaluation (Signed)
Procedure(s):  COLONOSCOPY.    MAC    Anesthesia Post Evaluation      Multimodal analgesia: multimodal analgesia used between 6 hours prior to anesthesia start to PACU discharge  Patient location during evaluation: PACU  Patient participation: waiting for patient participation  Level of consciousness: sleepy but conscious  Pain score: 0  Pain management: adequate  Airway patency: patent  Anesthetic complications: no  Cardiovascular status: stable  Respiratory status: nasal cannula  Hydration status: acceptable  Post anesthesia nausea and vomiting:  none  Final Post Anesthesia Temperature Assessment:  Normothermia (36.0-37.5 degrees C)      INITIAL Post-op Vital signs:   Vitals Value Taken Time   BP 143/66 03/30/2019 10:50 AM   Temp     Pulse 84 03/30/2019 10:51 AM   Resp 18 03/30/2019 10:50 AM   SpO2 98 % 03/30/2019 10:51 AM   Vitals shown include unvalidated device data.

## 2019-03-30 NOTE — Brief Op Note (Signed)
Brief Postoperative Note    Patient: Andrea [name]  Date of Birth: 11/28/1945  MRN: [id number]    Date of Procedure: 03/30/2019     Pre-Op Diagnosis: Rectal abnormality [K62.9]    Post-Op Diagnosis: no significant rectal lesion seen      Procedure(s):  COLONOSCOPY    Surgeon(s):  Letesha Klecker O, MD    Surgical Assistant: None    Anesthesia: MAC     Estimated Blood Loss (mL): Minimal    Complications: None    Specimens:   ID Type Source Tests Collected by Time Destination   1 : cecal polyp Preservative Cecum  Nyaira Hodgens O, MD 03/30/2019 1037 Pathology   2 : polyp @ 65 cm Preservative Colon  Abbye Lao O, MD 03/30/2019 1044 Pathology   3 : rectal polyp Preservative Rectal  Giang Hemme O, MD 03/30/2019 1045 Pathology        Implants: * No implants in log *    Drains: * No LDAs found *    Findings: 1. Cecal polyp 2. Polyp at 65 cm  3. Rectal polyp  4. Diverticulosis 5. No rectal mass identified     Electronically Signed by Charlen Bakula O Shauntee Karp, MD on 03/30/2019 at 10:51 AM

## 2019-03-30 NOTE — Op Note (Signed)
[facility]  OPERATIVE REPORT    Name:  Andrea [name], Andrea [name]  MR#:  [id number]  DOB:  06-04-1946  ACCOUNT #:  [id number]  DATE OF SERVICE:  03/30/2019    PREOPERATIVE DIAGNOSIS:  Rectal lesion identified by PET scan in March.    POSTOPERATIVE DIAGNOSIS:  No rectal lesion identified.    PROCEDURE PERFORMED:  Colonoscopy with cold biopsy polypectomy x3.    SURGEON:  Esmond Plants, MD    ASSISTANT: None    ANESTHESIA:  MAC.    COMPLICATIONS: None.    SPECIMENS REMOVED:  1.  Cecal polyp.  2.  Polyp at 65 cm.  3.  Rectal polyp.    IMPLANTS: None.    ESTIMATED BLOOD LOSS:  Minimal.    FINDINGS:  1.  Cecal polyp.  2.  Polyp at 65 cm.  3.  Rectal polyp.  4.  Sigmoid diverticulosis.  5.  No rectal mass identified.    DISPOSITION:  Stable.    PROCEDURE:  The patient was brought to the operative theater.  Monitoring devices and nasal cannula O2 were placed per Anesthesia and the patient was placed in the left side down decubitus position.  IV sedation was administered and a time-out was performed.  Digital rectal exam was performed which did not reveal any specific lesions.  A well-lubricated Olympus colonoscope was introduced in the patient's rectum and advanced to the cecum.  The cecum was identified by identification of the appendiceal orifice and the ileocecal valve.  The prep was adequate.  A small polypoid cecal lesion was identified and removed in its entirety by cold biopsy forceps.  A second lesion at 65 cm was found and removed in a similar fashion.  There was a small rectal polyp that was less than 3-mm and it was removed in a similar fashion.  The sigmoid colon showed diverticulosis.  We paid particular attention in the rectum because of the findings of PET scan and there was no evidence of any lesion in the rectum or in the sigmoid colon that would explain the findings on PET CT.  The scope was carefully withdrawn.  The patient tolerated the procedure  well and was transferred to PACU in stable condition.      Esmond Plants, MD      [id number]  D:  03/30/2019 11:06  T:  03/30/2019 11:30  JOB #:  [id number]  CC:  June B Daffeh, MD

## 2019-03-30 NOTE — Progress Notes (Signed)
Consent: The patient and/or their healthcare decision maker is aware that they may receive a bill for this audio only encounter, depending on their insurance coverage, and has provided verbal consent to proceed: Yes.  ??  INTERVAL HISTORY??(Audio Only): Mrs.??Andrea [name] is a 73 year old widowed white female following up with me??5 weeks??since her last??appointment re: significant symptoms of Major Depression as well as Generalized Anxiety disorder. I had??stopped??the Effexor XR at the first appt (75 mg) due to severe noradrenergic SE's, but she became??worse??after that??with intense NM's and some increased emotional lability so we started Zoloft??but later changed to??Lexapro??due to increased anxiety.??Over 9??months ago??we stopped the Lexapro (and Baclofen) and went back to low-dose Effexor XR because it's the last med she felt better taking and she responded well, feeling "excellent" shortly after that. However, she was then Dx'ed with AdenoCA of the lung and had a RUL lobectomy 12 weeks ago, and has been more dysphoric and anxious (initially), but much better the last 2 times. She's had plenty of residual anxiety but the Ativan tends to make her drowsy. Last time we tried to use Vistaril but it's also been overly sedating  ??  She??states that??she's??been feeling "pretty good" overall affectively, even with all of the stressors that she's dealing with. However, she couldn't tolerate the Vistaril due to more sedation than she has with the Ativan. She feels she's using the Ativan reasonably effectively although the 0.5 mg dose still makes her slightly drowsy and the 0.25 mg dose isn't as effective. She'd like to retry the Lexapro to see if it'll help with the residual anxiety and muscle tension that causes regular HA's and some back tightness. Her son is still helping her to get the house ready to sell (will be moving to [place] near them), and her DIL is a psychologist and had  been helping her with some CBT techniques. She's still trying to maintain a more positive mindset and she's also staying active. Will add Lexapro at only 5 mg and see if it helps, watching closely for any SE's  ??  CURRENT MEDICATIONS:  1.????Effexor XR 37.5??mg daily  2. ??Lorazepam??0.5??mg prn--takes??0.5 mg sparingly still  3.   25 mg tid prn--stopped due to sedation  ??  MENTAL STATUS EXAM:????[name]??was noted to be fairly??pleasant and cooperative, and exhibited a fairly bright and euthymic mood. She was fairly animated and still denied having any??significant??neurovegetative dysfunction presently. She also denied??any??SI or PDW thoughts??as well.??Her anxiety level was??mild overall, but still causing some physical sx's. She denied having any manic or hypomanic symptoms at all. There was no evidence of any psychosis such as hallucinations or delusional thinking,??and her thoughts were fairly organized and coherent. ??Her judgment and insight appear to be much better??now??and cognitively her fund of knowledge is above average. ??  ??  DIAGNOSTIC IMPRESSION:  AXIS I: ??Major Depression, recurrent, very mild??(F33.0)   ????????????????????????Generalized??Anxiety disorder--still stable??(F41.1).  AXIS II: ??Deferred--some??.  AXIS III: ??Lung CA ([place]); hypertension; gastroesophageal reflux disease; rotator cuff repair.  ??  PLAN:  1.????Continue the??Effexor XR at only 37.5 mg daily--#30 with 5??refills.  2.????Continue??the Ativan at??0.5 mg??bid??prn--#60 with 2 refills.  3.????Begin a trial on Lexapro at 5 mg daily--#30 with 1 refill. Watch out for any SE's that may emerge.  4.  Continue the CBT practices she's been using.  4. ??She will follow up with me in??6??weeks, sooner if needed.  ??  I affirm this is a Patient-Initiated-Episode with a patient who has not had a related appointment within my department in the past 7  days or scheduled within the next 24 hours.  Total Time: 17 minutes  Note: not billable if the call serves to triage the patient into an  appointment for the relevant concern. Patient was evaluated by phone call and had no access to a computer or smart phone. Chart reviewed. Evaluated and management per the note above. POS: patient at home; provider in office.

## 2019-04-07 ENCOUNTER — Encounter: Attending: Surgery

## 2019-04-14 ENCOUNTER — Encounter

## 2019-04-15 ENCOUNTER — Ambulatory Visit: Attending: Family

## 2019-04-15 ENCOUNTER — Ambulatory Visit: Admit: 2019-04-15 | Discharge: 2019-04-15 | Payer: MEDICARE | Attending: Family

## 2019-04-15 ENCOUNTER — Inpatient Hospital Stay: Admit: 2019-04-15 | Payer: MEDICARE

## 2019-04-15 DIAGNOSIS — C3492 Malignant neoplasm of unspecified part of left bronchus or lung: Secondary | ICD-10-CM

## 2019-04-15 NOTE — Progress Notes (Signed)
Follow up for lung cancer (left).      1. Have you been to the ER, urgent care clinic since your last visit?  Hospitalized since your last visit?No    2. Have you seen or consulted any other health care providers outside of the [facility] since your last visit?  Include any pap smears or colon screening. No

## 2019-04-15 NOTE — Progress Notes (Signed)
Subjective:      Andrea [name] is a 73 y.o. female presents for 3 month follow up care .     Procedure:  01/08/2019: Robotic Left Upper Lobectomy by Dr Glo Herring for Invasive Adenocarcinoma pT2b pN1 stage IIB    Appetite is good. Eating a regular diet without difficulty.   Bowel movements are regular.  The patient is voiding without difficulty.  The patient is not having any pain..    Objective:     Visit Vitals  BP 148/69 (BP 1 Location: Right arm, BP Patient Position: Sitting)   Pulse 60   Temp 98.5 ??F (36.9 ??C) (Oral)   Resp 18   Ht 5\' 2"  (1.575 m)   Wt 150 lb (68 kg)   SpO2 96%   BMI 27.44 kg/m??       Physical Exam:  GENERAL: alert, cooperative, no distress, appears stated age, LUNG: clear to auscultation bilaterally, HEART: regular rate and rhythm, S1, S2 normal, no murmur, click, rub or gallop, ABDOMEN: soft, non-tender. Bowel sounds normal. No masses,  no organomegaly, EXTREMITIES:  extremities normal, atraumatic, no cyanosis or edema, SKIN: Normal. and no rash or abnormalities    Data Review images and reports reviewed CXR 04/15/19  Indication: Malignant neoplasm of unspecified part of left bronchus or lung.  ??  COMPARISON: 01/20/2019  ??  PA and lateral views demonstrate normal heart size. The patient is status post  left upper lobe lobectomy. There are the expected postoperative changes. There  is decreasing opacities in left upper lobe. Left pleural effusion has resolved.  The lungs are clear acute process. No adenopathy or pleural effusions. Old left  rib fractures are noted  ??  IMPRESSION  IMPRESSION:  1. No acute process. The patient is status post left upper lobe lobectomy    Assessment:     Patient Active Problem List   Diagnosis Code   ??? Cavitating mass in left upper lung lobe J98.4   ??? Lung cancer ([place]) C34.90   ??? Rectal abnormality K62.9       She feels good, has no complaints. Plan to go to [place] next week to see her new grandchild. At her last visit she was to follow up with Dr Orvan Seen,  however she has not seen him. She asked if she can be seen by DR Myrtie Neither  In [place] which would much slower to her. I will send a referral today. She recently underwent colonoscopy for follow up to PET in March that showed hypermetabolic distal rectum lesion SUV 21, had several polyps removed. She was told she would need a follow up PET scan. I discussed this with her and told her to discuss it with Dr Myrtie Neither as to which would be needed PET or CT          Plan/Recommendations/Medical Decision Making:     Per the NCCN guidelines: [name] will be followed routinely with a history and physical and imaging (CT Chest) every 6 months for the first 2 years then yearly. I will see her back in 3 months but defer imaging to Dr Myrtie Neither    We discussed the importance of proper diet and 30 minutes/day of proper exercise.    All questions were personally answered.    Thank you for allowing [place] to participate in the care of your patient.    Tempie Hoist, [facility]  Thoracic Surgery

## 2019-04-15 NOTE — Progress Notes (Signed)
Patient advised of appointment with Dr. Luvenia Starch ([facility]) on Thursday, July 23rd @ 1:00 pm.  Arrive @ 12:45 pm.      Packet will be mailed from his office for her to fill out and bring with her to appointment.    Patient verbalized understanding of all.

## 2019-04-15 NOTE — Progress Notes (Signed)
Follow up for lung cancer (left).      1. Have you been to the ER, urgent care clinic since your last visit?  Hospitalized since your last visit?No    2. Have you seen or consulted any other health care providers outside of the [facility] since your last visit?  Include any pap smears or colon screening. No

## 2019-04-15 NOTE — Telephone Encounter (Signed)
Spoke with Lattie Haw @ Dr Glo Herring office,patient is scheduled on 04/22/2019 @ 1:00pm.

## 2019-04-15 NOTE — Patient Instructions (Signed)
Follow up with Dr Myrtie Neither and discuss having PET vs CT

## 2019-04-15 NOTE — Progress Notes (Signed)
Subjective:      Andrea [name] is a 73 y.o. female presents for 3 month follow up care .     Procedure:  01/08/2019: Robotic Left Upper Lobectomy by Dr Glo Herring for Invasive Adenocarcinoma pT2b pN1 stage IIB    Appetite is good. Eating a regular diet without difficulty.   Bowel movements are regular.  The patient is voiding without difficulty.  The patient is not having any pain..    Objective:     Visit Vitals  BP 148/69 (BP 1 Location: Right arm, BP Patient Position: Sitting)   Pulse 60   Temp 98.5 ??F (36.9 ??C) (Oral)   Resp 18   Ht 5\' 2"  (1.575 m)   Wt 150 lb (68 kg)   SpO2 96%   BMI 27.44 kg/m??       Physical Exam:  GENERAL: alert, cooperative, no distress, appears stated age, LUNG: clear to auscultation bilaterally, HEART: regular rate and rhythm, S1, S2 normal, no murmur, click, rub or gallop, ABDOMEN: soft, non-tender. Bowel sounds normal. No masses,  no organomegaly, EXTREMITIES:  extremities normal, atraumatic, no cyanosis or edema, SKIN: Normal. and no rash or abnormalities    Data Review images and reports reviewed CXR 04/15/19  Indication: Malignant neoplasm of unspecified part of left bronchus or lung.  ??  COMPARISON: 01/20/2019  ??  PA and lateral views demonstrate normal heart size. The patient is status post  left upper lobe lobectomy. There are the expected postoperative changes. There  is decreasing opacities in left upper lobe. Left pleural effusion has resolved.  The lungs are clear acute process. No adenopathy or pleural effusions. Old left  rib fractures are noted  ??  IMPRESSION  IMPRESSION:  1. No acute process. The patient is status post left upper lobe lobectomy    Assessment:     Patient Active Problem List   Diagnosis Code   ??? Cavitating mass in left upper lung lobe J98.4   ??? Lung cancer ([place]) C34.90   ??? Rectal abnormality K62.9       She feels good, has no complaints. Plan to go to [place] next week to see her new grandchild. At her last visit she was to follow up with Dr  Orvan Seen, however she has not seen him. She asked if she can be seen by DR Myrtie Neither  In [place] which would much slower to her. I will send a referral today. She recently underwent colonoscopy for follow up to PET in March that showed hypermetabolic distal rectum lesion SUV 21, had several polyps removed. She was told she would need a follow up PET scan. I discussed this with her and told her to discuss it with Dr Myrtie Neither as to which would be needed PET or CT          Plan/Recommendations/Medical Decision Making:     Per the NCCN guidelines: LEAHMARIE GASIOROWSKI will be followed routinely with a history and physical and imaging (CT Chest) every 6 months for the first 2 years then yearly. I will see her back in 3 months but defer imaging to Dr Myrtie Neither    We discussed the importance of proper diet and 30 minutes/day of proper exercise.    All questions were personally answered.    Thank you for allowing [place] to participate in the care of your patient.    Tempie Hoist, [facility]  Thoracic Surgery

## 2019-04-20 NOTE — Progress Notes (Signed)
Pt was identified with 2 identifiers, she was given her colonoscopy results. She had 3 polyps that were removed, 2 are of greater risk. She is to repeat her colonoscopy in 3 yrs. Per Dr. Earley Favor  she is to f/u with her pcp about repeating her PET scan. Pt stated understanding.

## 2019-04-21 ENCOUNTER — Encounter: Attending: Family

## 2019-04-22 ENCOUNTER — Ambulatory Visit: Attending: Hematology & Oncology

## 2019-04-22 ENCOUNTER — Ambulatory Visit: Admit: 2019-04-22 | Discharge: 2019-04-22 | Payer: MEDICARE | Attending: Hematology & Oncology

## 2019-04-22 ENCOUNTER — Inpatient Hospital Stay: Admit: 2019-04-22 | Payer: MEDICARE

## 2019-04-22 DIAGNOSIS — C3412 Malignant neoplasm of upper lobe, left bronchus or lung: Secondary | ICD-10-CM

## 2019-04-22 NOTE — Addendum Note (Signed)
Addendum Note by Carren Rang, MD at 04/22/19 1300                Author: Carren Rang, MD  Service: --  Author Type: Physician       Filed: 06/09/19 1548  Encounter Date: 04/22/2019  Status: Signed          Editor: Carren Rang, MD (Physician)          Addended by: Carren Rang on: 06/09/2019 03:48 PM    Modules accepted: Orders

## 2019-04-22 NOTE — Progress Notes (Signed)
Progress Notes by Carren Rang, MD at 04/22/19 1300                Author: Carren Rang, MD  Service: --  Author Type: Physician       Filed: 06/09/19 1547  Encounter Date: 04/22/2019  Status: Addendum          Editor: Carren Rang, MD (Physician)          Related Notes: Original Note by Carren Rang, MD (Physician) filed at 06/09/19 1546               PET 05/20/2019   IMPRESSION: Stable focal activity in the rectum. Interval left upper lobectomy.   No evidence of recurrent or metastatic disease.      MRI 06/01/2019   IMPRESSION: Concentric enhancement and anorectal junction without evidence of   discrete mass which is nonspecific but corresponds to the area of increased   uptake noted on PET.      Discussed with Dr Jawhar--recommend referral to colorectal surgeon for possible ultrasound--referring to Dr Richrd Prime.                       Reason for Visit:     Andrea [name] is a 73 y.o. female who is seen for eval of adjuvant treatment of lung cancer.        Treatment History:     Dx: Lung Adenocarcinoma--Left Upper Lobectomy 01/08/2019--pT2bpN1--Stage IIb.  No targetable mutations--PDL1 0%, BRAF neg, EGFR unmutated, ALK neg, ROS neg   Proposed Tx: Adjuvant chemotherapy wit cisplatin based doublet   Goal: Cure        History of Present Illness:     Here for eval of adjuvant chemotherapy for lung cancer.  Is not interested in taking chemotherapy at all.  Wants to know about molecular testing.  Worried about PET positive area  in rectum.  Asymptomatic.          Past Medical History:        Diagnosis  Date         ?  Anxiety       ?  Arthritis       ?  Depression       ?  Essential hypertension       ?  Gallstone       ?  GERD (gastroesophageal reflux disease)       ?  Hemoptysis       ?  Lung cancer ([place])  2020         ?  RBBB             Past Surgical History:         Procedure  Laterality  Date          ?  COLONOSCOPY  N/A  03/30/2019          COLONOSCOPY performed by Ilona Sorrel, MD at [facility]          ?  HX CATARACT REMOVAL  Right  07/2018     ?  HX CHOLECYSTECTOMY    06/14/2014     ?  HX GI              COLONOSCOPY          ?  HX MOHS PROCEDURES    2011     ?  HX ORTHOPAEDIC  Bilateral  1970`S  CARPAL TUNNEL           ?  HX POLYPECTOMY              X2          ?  HX ROTATOR CUFF REPAIR  Right  08/2017     ?  HX ROTATOR CUFF REPAIR  Left  2011          ?  HX TONSILLECTOMY    1962           Social History          Tobacco Use         ?  Smoking status:  Former Smoker              Packs/day:  1.00         Years:  34.00         Pack years:  34.00         Start date:  [birth year]         Last attempt to quit:  1991         Years since quitting:  29.5         ?  Smokeless tobacco:  Never Used       Substance Use Topics         ?  Alcohol use:  Yes              Alcohol/week:  7.0 standard drinks              Types:  7 Shots of liquor per week           Family History         Problem  Relation  Age of Onset          ?  Stroke  Mother       ?  Hypertension  Mother       ?  Stroke  Father       ?  Arthritis-osteo  Brother       ?  Cancer  Brother       ?  Heart Disease  Brother       ?  Kidney Disease  Brother       ?  Bleeding Prob  Son                factor 5          ?  Anesth Problems  Neg Hx            Current Outpatient Medications        Medication  Sig         ?  LORazepam (ATIVAN) 0.5 mg tablet  Take 1 Tab by mouth two (2) times daily as needed for Anxiety. Max Daily Amount: 1 mg.     ?  venlafaxine-SR (EFFEXOR-XR) 37.5 mg capsule  Take 1 Cap by mouth daily.     ?  Methylcellulose, with Sugar, (Citrucel, sucrose,) powd  Take  by mouth daily.     ?  ibuprofen 200 mg cap  Take  by mouth as needed for Pain.     ?  lisinopriL (PRINIVIL, ZESTRIL) 20 mg tablet  Take 20 mg by mouth nightly.     ?  omeprazole (PRILOSEC) 10 mg capsule  Take 10 mg by mouth nightly.     ?  hydrALAZINE (APRESOLINE) 50 mg tablet  Take 50 mg by mouth two (2) times a day.         ?  atenolol (TENORMIN) 25 mg tablet  Take 25 mg  by mouth nightly.         ?  escitalopram oxalate (LEXAPRO) 5 mg tablet  Take 1 Tab by mouth daily.         ?  hydroCHLOROthiazide (HYDRODIURIL) 25 mg tablet  Take 25 mg by mouth nightly.          No current facility-administered medications for this visit.          No Known Allergies       Review of Systems: A complete review of systems was obtained, negative except as described above.        Physical Exam:        Visit Vitals      BP  131/54     Pulse  61     Temp  98.1 ??F (36.7 ??C) (Skin)     Resp  16     Ht  5' 1.71" (1.567 m)     Wt  154 lb 6.4 oz (70 kg)     SpO2  97%        BMI  28.50 kg/m??        ECOG PS: 0   General: No distress   Eyes:  PERRLA, anicteric sclerae   HENT: Atraumatic, OP clear   Neck: Supple   Lymphatic: No cervical, supraclavicular, or inguinal adenopathy   Respiratory: CTAB, normal respiratory effort   CV: Normal rate, regular rhythm, no murmurs, no peripheral edema   GI: Soft, nontender, nondistended, no masses, no hepatomegaly, no splenomegaly   MS: Normal gait and station. Digits without clubbing or cyanosis.   Skin: No rashes, ecchymoses, or petechiae. Normal temperature, turgor, and texture.   Psych: Alert, oriented, appropriate affect, normal judgment/insight        Results:          Lab Results         Component  Value  Date/Time            WBC  10.5  01/09/2019 04:39 AM       HGB  11.5  01/09/2019 04:39 AM       HCT  35.1  01/09/2019 04:39 AM       PLATELET  253  01/09/2019 04:39 AM       MCV  99.7 (H)  01/09/2019 04:39 AM            ABS. NEUTROPHILS  3.5  12/11/2018 02:28 PM          Lab Results         Component  Value  Date/Time            Sodium  128 (L)  01/09/2019 04:39 AM       Potassium  3.6  01/09/2019 04:39 AM       Chloride  94 (L)  01/09/2019 04:39 AM       CO2  27  01/09/2019 04:39 AM       Glucose  100  01/09/2019 04:39 AM       BUN  19  01/09/2019 04:39 AM       Creatinine  0.85  01/09/2019 04:39 AM       GFR est AA  >60  01/09/2019 04:39 AM       GFR est non-AA  >60   01/09/2019 04:39 AM       Calcium  8.9  01/09/2019 04:39 AM  Glucose (POC)  118 (H)  01/08/2019 06:42 AM          Lab Results         Component  Value  Date/Time            Bilirubin, total  0.4  12/11/2018 02:28 PM       ALT (SGPT)  10  12/11/2018 02:28 PM       Alk. phosphatase  67  12/11/2018 02:28 PM       Protein, total  6.4  12/11/2018 02:28 PM       Albumin  4.1  12/11/2018 02:28 PM            Globulin  3.0  03/02/2018 04:17 PM        PET 12/17/2018   IMPRESSION: 3.4 cm left upper lobe pulmonary mass demonstrates high-grade FDG   activity, compatible with malignancy. No abnormal FDG activity within hilar or   mediastinal lymph nodes. High-grade increased FDG activity in the distal rectum   is suspicious for malignancy; clinical correlation is recommended.      Lung Resection 01/08/2019   Invasive adenocarcinoma, 4.1cm, moderately-differentiated, acinar predominant    Visceral pleural invasion present    Bronchial and vascular margins negative for carcinoma    One of five lymph nodes involved by adenocarcinoma (1/5)    See synoptic report and diagnosis comment    LUNG    SPECIMEN    Procedure: Lobectomy    Specimen Laterality: Left    TUMOR    Tumor Site: Upper lobe of lung    Histologic Type: Invasive adenocarcinoma, acinar predominant    Other Subtypes Present: Lepidic (5%)    Histologic Grade: G2: Moderately differentiated    Spread Through Scientist, forensic (STAS): Not identified    Total Tumor Size Inclusive of Invasive and Lepidic Components: 4.1 x 3.8 x 3.5 cm    Invasive Tumor Size: 4.1 cm in greatest dimension    Tumor Focality: Single tumor    Visceral Pleura Invasion: Present    Direct Invasion of Adjacent Structures: No adjacent structures present    Treatment Effect: No known presurgical therapy    Lymphovascular Invasion: Not identified    MARGINS    Margins: All margins are uninvolved by tumor    Margins Examined: Vascular, Bronchial    Distance of Invasive Carcinoma from Closest Margin: 1.5 cm     Closest Margin: Bronchial    LYMPH NODES    Number of Lymph Nodes Involved: 1    Nodal Stations Involved: Left intrapulmonary lymph node, adjacent to tumor    Extranodal Extension: Not identified    Number of Lymph Nodes Examined: 13    Nodal Stations Examined: 9L, 10L, 11L, 12L, station 7, station 5, intrapulmonary lymph node    PATHOLOGIC STAGE CLASSIFICATION (pTNM, AJCC 8th Edition)    Primary Tumor (pT): pT2b    Regional Lymph Nodes (pN): pN1    ADDITIONAL FINDINGS    Additional Pathologic Findings: Emphysema       Colonoscopy 03/30/2019   FINAL PATHOLOGIC DIAGNOSIS    1. Cecal polyp, biopsy:    Tubular adenoma (1 fragment)    2. Colon polyp at 65 cm, biopsy:    Tubular adenoma (3 fragments)    3. Rectal polyp, biopsy:    Hyperplastic polyp (1 fragment)         Assessment:     1) Stage IIb Lung Adenocarcinoma   2) Anorectal positive PET  Plan:     1) Discussed chemotherapy--she is against.  Discussed Immunotherapy and Targeted Therapy--no role in her specific circumstance.  Follow up with me or  Dr Glo Herring every 3 months with scans--High Risk Recurrence   2) Repeat PET, if positive again get MRI.  Discuss with Dr Earley Favor            Signed By:  Carren Rang, MD

## 2019-04-22 NOTE — Progress Notes (Signed)
Andrea [name] is a 73 y.o. female new patient today referred by Cleophus Molt, NP for New L Lung cancer stage 2.  Patient is upset due to recent colonoscopy, she has not had results.

## 2019-04-22 NOTE — Addendum Note (Signed)
Addended by: Carren Rang on: 06/09/2019 03:48 PM     Modules accepted: Orders

## 2019-04-22 NOTE — Progress Notes (Addendum)
PET 05/20/2019  IMPRESSION: Stable focal activity in the rectum. Interval left upper lobectomy.  No evidence of recurrent or metastatic disease.    MRI 06/01/2019  IMPRESSION: Concentric enhancement and anorectal junction without evidence of  discrete mass which is nonspecific but corresponds to the area of increased  uptake noted on PET.    Discussed with Dr Jawhar--recommend referral to colorectal surgeon for possible ultrasound--referring to Dr Richrd Prime.             Reason for Visit:   Andrea [name] is a 73 y.o. female who is seen for eval of adjuvant treatment of lung cancer.    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  No targetable mutations--PDL1 0%, BRAF neg, EGFR unmutated, ALK neg, ROS neg  Proposed Tx: Adjuvant chemotherapy wit cisplatin based doublet  Goal: Cure    History of Present Illness:   Here for eval of adjuvant chemotherapy for lung cancer.  Is not interested in taking chemotherapy at all.  Wants to know about molecular testing.  Worried about PET positive area in rectum.  Asymptomatic.      Past Medical History:   Diagnosis Date   ??? Anxiety    ??? Arthritis    ??? Depression    ??? Essential hypertension    ??? Gallstone    ??? GERD (gastroesophageal reflux disease)    ??? Hemoptysis    ??? Lung cancer ([place]) 2020   ??? RBBB       Past Surgical History:   Procedure Laterality Date   ??? COLONOSCOPY N/A 03/30/2019    COLONOSCOPY performed by Ilona Sorrel, MD at [facility]   ??? HX CATARACT REMOVAL Right 07/2018   ??? HX CHOLECYSTECTOMY  06/14/2014   ??? HX GI      COLONOSCOPY   ??? HX MOHS PROCEDURES  2011   ??? HX ORTHOPAEDIC Bilateral 1970`S    CARPAL TUNNEL    ??? HX POLYPECTOMY      X2   ??? HX ROTATOR CUFF REPAIR Right 08/2017   ??? HX ROTATOR CUFF REPAIR Left 2011   ??? HX TONSILLECTOMY  1962      Social History     Tobacco Use   ??? Smoking status: Former Smoker     Packs/day: 1.00     Years: 34.00     Pack years: 34.00     Start date: 80     Last attempt to quit:  1991     Years since quitting: 29.5   ??? Smokeless tobacco: Never Used   Substance Use Topics   ??? Alcohol use: Yes     Alcohol/week: 7.0 standard drinks     Types: 7 Shots of liquor per week      Family History   Problem Relation Age of Onset   ??? Stroke Mother    ??? Hypertension Mother    ??? Stroke Father    ??? Arthritis-osteo Brother    ??? Cancer Brother    ??? Heart Disease Brother    ??? Kidney Disease Brother    ??? Bleeding Prob Son         factor 5   ??? Anesth Problems Neg Hx      Current Outpatient Medications   Medication Sig   ??? LORazepam (ATIVAN) 0.5 mg tablet Take 1 Tab by mouth two (2) times daily as needed for Anxiety. Max Daily Amount: 1 mg.   ??? venlafaxine-SR (EFFEXOR-XR) 37.5 mg capsule Take 1  Cap by mouth daily.   ??? Methylcellulose, with Sugar, (Citrucel, sucrose,) powd Take  by mouth daily.   ??? ibuprofen 200 mg cap Take  by mouth as needed for Pain.   ??? lisinopriL (PRINIVIL, ZESTRIL) 20 mg tablet Take 20 mg by mouth nightly.   ??? omeprazole (PRILOSEC) 10 mg capsule Take 10 mg by mouth nightly.   ??? hydrALAZINE (APRESOLINE) 50 mg tablet Take 50 mg by mouth two (2) times a day.   ??? atenolol (TENORMIN) 25 mg tablet Take 25 mg by mouth nightly.   ??? escitalopram oxalate (LEXAPRO) 5 mg tablet Take 1 Tab by mouth daily.   ??? hydroCHLOROthiazide (HYDRODIURIL) 25 mg tablet Take 25 mg by mouth nightly.     No current facility-administered medications for this visit.       No Known Allergies     Review of Systems: A complete review of systems was obtained, negative except as described above.    Physical Exam:     Visit Vitals  BP 131/54   Pulse 61   Temp 98.1 ??F (36.7 ??C) (Skin)   Resp 16   Ht 5' 1.71" (1.567 m)   Wt 154 lb 6.4 oz (70 kg)   SpO2 97%   BMI 28.50 kg/m??     ECOG PS: 0  General: No distress  Eyes: PERRLA, anicteric sclerae  HENT: Atraumatic, OP clear  Neck: Supple  Lymphatic: No cervical, supraclavicular, or inguinal adenopathy  Respiratory: CTAB, normal respiratory effort  CV: Normal rate, regular rhythm, no  murmurs, no peripheral edema  GI: Soft, nontender, nondistended, no masses, no hepatomegaly, no splenomegaly  MS: Normal gait and station. Digits without clubbing or cyanosis.  Skin: No rashes, ecchymoses, or petechiae. Normal temperature, turgor, and texture.  Psych: Alert, oriented, appropriate affect, normal judgment/insight    Results:     Lab Results   Component Value Date/Time    WBC 10.5 01/09/2019 04:39 AM    HGB 11.5 01/09/2019 04:39 AM    HCT 35.1 01/09/2019 04:39 AM    PLATELET 253 01/09/2019 04:39 AM    MCV 99.7 (H) 01/09/2019 04:39 AM    ABS. NEUTROPHILS 3.5 12/11/2018 02:28 PM     Lab Results   Component Value Date/Time    Sodium 128 (L) 01/09/2019 04:39 AM    Potassium 3.6 01/09/2019 04:39 AM    Chloride 94 (L) 01/09/2019 04:39 AM    CO2 27 01/09/2019 04:39 AM    Glucose 100 01/09/2019 04:39 AM    BUN 19 01/09/2019 04:39 AM    Creatinine 0.85 01/09/2019 04:39 AM    GFR est AA >60 01/09/2019 04:39 AM    GFR est non-AA >60 01/09/2019 04:39 AM    Calcium 8.9 01/09/2019 04:39 AM    Glucose (POC) 118 (H) 01/08/2019 06:42 AM     Lab Results   Component Value Date/Time    Bilirubin, total 0.4 12/11/2018 02:28 PM    ALT (SGPT) 10 12/11/2018 02:28 PM    Alk. phosphatase 67 12/11/2018 02:28 PM    Protein, total 6.4 12/11/2018 02:28 PM    Albumin 4.1 12/11/2018 02:28 PM    Globulin 3.0 03/02/2018 04:17 PM     PET 12/17/2018  IMPRESSION: 3.4 cm left upper lobe pulmonary mass demonstrates high-grade FDG  activity, compatible with malignancy. No abnormal FDG activity within hilar or  mediastinal lymph nodes. High-grade increased FDG activity in the distal rectum  is suspicious for malignancy; clinical correlation is recommended.    Lung Resection  01/08/2019  Invasive adenocarcinoma, 4.1cm, moderately-differentiated, acinar predominant   Visceral pleural invasion present   Bronchial and vascular margins negative for carcinoma   One of five lymph nodes involved by adenocarcinoma (1/5)   See synoptic report and  diagnosis comment   LUNG   SPECIMEN   Procedure: Lobectomy   Specimen Laterality: Left   TUMOR   Tumor Site: Upper lobe of lung   Histologic Type: Invasive adenocarcinoma, acinar predominant   Other Subtypes Present: Lepidic (5%)   Histologic Grade: G2: Moderately differentiated   Spread Through Scientist, forensic (STAS): Not identified   Total Tumor Size Inclusive of Invasive and Lepidic Components: 4.1 x 3.8 x 3.5 cm   Invasive Tumor Size: 4.1 cm in greatest dimension   Tumor Focality: Single tumor   Visceral Pleura Invasion: Present   Direct Invasion of Adjacent Structures: No adjacent structures present   Treatment Effect: No known presurgical therapy   Lymphovascular Invasion: Not identified   MARGINS   Margins: All margins are uninvolved by tumor   Margins Examined: Vascular, Bronchial   Distance of Invasive Carcinoma from Closest Margin: 1.5 cm   Closest Margin: Bronchial   LYMPH NODES   Number of Lymph Nodes Involved: 1   Nodal Stations Involved: Left intrapulmonary lymph node, adjacent to tumor   Extranodal Extension: Not identified   Number of Lymph Nodes Examined: 13   Nodal Stations Examined: 9L, 10L, 11L, 12L, station 7, station 5, intrapulmonary lymph node   PATHOLOGIC STAGE CLASSIFICATION (pTNM, AJCC 8th Edition)   Primary Tumor (pT): pT2b   Regional Lymph Nodes (pN): pN1   ADDITIONAL FINDINGS   Additional Pathologic Findings: Emphysema     Colonoscopy 03/30/2019  FINAL PATHOLOGIC DIAGNOSIS   1. Cecal polyp, biopsy:   Tubular adenoma (1 fragment)   2. Colon polyp at 65 cm, biopsy:   Tubular adenoma (3 fragments)   3. Rectal polyp, biopsy:   Hyperplastic polyp (1 fragment)     Assessment:   1) Stage IIb Lung Adenocarcinoma  2) Anorectal positive PET      Plan:   1) Discussed chemotherapy--she is against.  Discussed Immunotherapy and Targeted Therapy--no role in her specific circumstance.  Follow up with me or Dr Glo Herring every 3 months with scans--High Risk Recurrence  2) Repeat PET, if positive again get MRI.   Discuss with Dr Earley Favor      Signed By: Carren Rang, MD

## 2019-05-11 ENCOUNTER — Inpatient Hospital Stay: Admit: 2019-05-11 | Payer: MEDICARE | Attending: Psychiatry

## 2019-05-11 MED ORDER — SERTRALINE 25 MG TAB
25 mg | ORAL_TABLET | Freq: Every day | ORAL | 3 refills | Status: DC
Start: 2019-05-11 — End: 2019-08-02

## 2019-05-11 NOTE — Progress Notes (Signed)
Consent: The patient and/or their healthcare decision maker is aware that they may receive a bill for this audio only encounter, depending on their insurance coverage, and has provided verbal consent to proceed: Yes.  ??  INTERVAL HISTORY??(Audio Only): [name] is a 73 year old widowed white female following up with me??6 weeks??since her last??appointment re: significant symptoms of Major Depression as well as Generalized Anxiety disorder. I had??stopped??the Effexor XR at the first appt (75 mg) due to severe noradrenergic SE's, but she became??worse??after that??with intense NM's and some increased emotional lability so we started Zoloft??but later changed to??Lexapro??due to increased anxiety.??Almost 11??months ago??we stopped the Lexapro (and Baclofen) and went back to low-dose Effexor XR because it's the last med she felt better taking and she??responded well,??feeling "excellent" shortly after that.??However, she was then Dx'ed with AdenoCA of the lung and had a RUL lobectomy over 4 months ago, and has been more dysphoric and anxious (initially), but much less so the last 2 times. She's had plenty of residual anxiety but the Ativan and Vistaril both tended to make her drowsy. Last time we introduced Lexapro at only 5 mg.  ??  She??states??that??she's??been??feeling "really anxious" because she just put her house on the market, which has been both stressful and very emotional for her. She's had a lot of stressful things happen and her future is somewhat uncertain because she won't be moving to [place] now, and she's not sure where she'll end up, given her limited budget. She stopped the Lexapro after she developed mouth ulcerations, but she thinks that could be due to another med. She agrees that it would be wise to try another AD to help with the anxiety issues (especially).  Will therefore start a trial on Zoloft at 25 mg and she can drop it to 12.5 mg if needed. Also encouraged her to use the Lorazepam more liberally to see if  it'll help.  ??  CURRENT MEDICATIONS:  1.????Effexor XR 37.5??mg daily  2. ??Lorazepam??0.5??mg prn--takes??0.5 mg more often, up to twice a day  3.  Lexapro 5 mg daily--stopped this due to possible SE's  ??  MENTAL STATUS EXAM:????Andrea [name]??was noted to be fairly??pleasant and cooperative,??but exhibited a more restricted and dysphoric/anxious mood.??She was fairly animated but quite dysphoric and anxious, and her??neurovegetative functioning has been more impaired??presently. She denied??any??SI or PDW thoughts, however.??Her anxiety level was??much more severe, although she didn't seem to have the physical sx's she usually does. She denied having any manic or hypomanic symptoms at all. There was no evidence of any psychosis such as hallucinations or delusional thinking,??and her thoughts were fairly organized and coherent. ??Her judgment and insight appear to be much??better??now??and cognitively her fund of knowledge is above average. ??  ??  DIAGNOSTIC IMPRESSION:  AXIS I: ??Major Depression, recurrent, moderate??(F33.1)   ????????????????????????Generalized??Anxiety disorder--slightly worse??(F41.1)  AXIS II: ??Deferred--some??.  AXIS III: ??Lung CA (Adeno); hypertension; gastroesophageal reflux disease; rotator cuff repair.  ??  PLAN:  1.????Continue the??Effexor XR at only 37.5 mg daily--has 4.5 months of??refills (30 day).  2.????Continue??the Ativan??at??0.5 mg??bid??prn--has roughly 2 refills (#60).  3.????Begin Zoloft at 25 mg daily--could take only 12.5 mg if needed--#30 with 3 refills.   4. ??Continue the CBT practices she's been using.  5. ??She will follow up with me in??6??weeks, sooner if needed.  ??  I affirm this is a Patient-Initiated-Episode with a patient who has not had a related appointment within my department in the past 7 days or scheduled within the next 24 hours.  Total Time:??35??minutes  Note: not  billable if the call serves to triage the patient into an appointment for the relevant concern. Patient was evaluated by phone call and had no access to a  computer or smart phone. Chart reviewed. Evaluated and management per the note above. POS: patient at home; provider in office.

## 2019-05-11 NOTE — Progress Notes (Signed)
Consent: The patient and/or their healthcare decision maker is aware that they may receive a bill for this audio only encounter, depending on their insurance coverage, and has provided verbal consent to proceed: Yes.  ??  INTERVAL HISTORY??(Audio Only): [name] is a 73 year old widowed white female following up with me??6 weeks??since her last??appointment re: significant symptoms of Major Depression as well as Generalized Anxiety disorder. I had??stopped??the Effexor XR at the first appt (75 mg) due to severe noradrenergic SE's, but she became??worse??after that??with intense NM's and some increased emotional lability so we started Zoloft??but later changed to??Lexapro??due to increased anxiety.??Almost 11??months ago??we stopped the Lexapro (and Baclofen) and went back to low-dose Effexor XR because it's the last med she felt better taking and she??responded well,??feeling "excellent" shortly after that.??However, she was then Dx'ed with AdenoCA of the lung and had a RUL lobectomy over 4 months ago, and has been more dysphoric and anxious (initially), but much less so the last 2 times. She's had plenty of residual anxiety but the Ativan and Vistaril both tended to make her drowsy. Last time we introduced Lexapro at only 5 mg.  ??  She??states??that??she's??been??feeling "really anxious" because she just put her house on the market, which has been both stressful and very emotional for her. She's had a lot of stressful things happen and her future is somewhat uncertain because she won't be moving to [place] now, and she's not sure where she'll end up, given her limited budget. She stopped the Lexapro after she developed mouth ulcerations, but she thinks that could be due to another med. She agrees that it would be wise to try another AD to help with the anxiety issues (especially).  Will therefore start a trial on Zoloft at 25 mg and she can drop it to 12.5 mg if needed. Also  encouraged her to use the Lorazepam more liberally to see if it'll help.  ??  CURRENT MEDICATIONS:  1.????Effexor XR 37.5??mg daily  2. ??Lorazepam??0.5??mg prn--takes??0.5 mg more often, up to twice a day  3.  Lexapro 5 mg daily--stopped this due to possible SE's  ??  MENTAL STATUS EXAM:????Andrea [name]??was noted to be fairly??pleasant and cooperative,??but exhibited a more restricted and dysphoric/anxious mood.??She was fairly animated but quite dysphoric and anxious, and her??neurovegetative functioning has been more impaired??presently. She denied??any??SI or PDW thoughts, however.??Her anxiety level was??much more severe, although she didn't seem to have the physical sx's she usually does. She denied having any manic or hypomanic symptoms at all. There was no evidence of any psychosis such as hallucinations or delusional thinking,??and her thoughts were fairly organized and coherent. ??Her judgment and insight appear to be much??better??now??and cognitively her fund of knowledge is above average. ??  ??  DIAGNOSTIC IMPRESSION:  AXIS I: ??Major Depression, recurrent, moderate??(F33.1)   ????????????????????????Generalized??Anxiety disorder--slightly worse??(F41.1)  AXIS II: ??Deferred--some??.  AXIS III: ??Lung CA (Adeno); hypertension; gastroesophageal reflux disease; rotator cuff repair.  ??  PLAN:  1.????Continue the??Effexor XR at only 37.5 mg daily--has 4.5 months of??refills (30 day).  2.????Continue??the Ativan??at??0.5 mg??bid??prn--has roughly 2 refills (#60).  3.????Begin Zoloft at 25 mg daily--could take only 12.5 mg if needed--#30 with 3 refills.   4. ??Continue the CBT practices she's been using.  5. ??She will follow up with me in??6??weeks, sooner if needed.  ??  I affirm this is a Patient-Initiated-Episode with a patient who has not had a related appointment within my department in the past 7 days or scheduled within the next 24 hours.  Total Time:??35??minutes  Note: not  billable if the call serves to triage the patient into an  appointment for the relevant concern. Patient was evaluated by phone call and had no access to a computer or smart phone. Chart reviewed. Evaluated and management per the note above. POS: patient at home; provider in office.

## 2019-05-14 ENCOUNTER — Ambulatory Visit: Payer: MEDICARE

## 2019-05-20 ENCOUNTER — Inpatient Hospital Stay: Admit: 2019-05-20 | Payer: MEDICARE | Attending: Hematology & Oncology

## 2019-05-20 DIAGNOSIS — C3412 Malignant neoplasm of upper lobe, left bronchus or lung: Secondary | ICD-10-CM

## 2019-05-20 MED ORDER — F-18 FLUORODEOXYGLUCOSE
Freq: Once | Status: AC
Start: 2019-05-20 — End: 2019-05-20
  Administered 2019-05-20: 16:00:00 via INTRAVENOUS

## 2019-05-20 MED ORDER — SODIUM CHLORIDE 0.9 % IJ SYRG
Freq: Once | INTRAMUSCULAR | Status: AC
Start: 2019-05-20 — End: 2019-05-20
  Administered 2019-05-20: 16:00:00 via INTRAVENOUS

## 2019-05-20 MED FILL — NORMAL SALINE FLUSH 0.9 % INJECTION SYRINGE: INTRAMUSCULAR | Qty: 10

## 2019-05-26 NOTE — Telephone Encounter (Signed)
Patient called, would like PET results.

## 2019-05-27 ENCOUNTER — Encounter

## 2019-05-27 NOTE — Telephone Encounter (Signed)
Pt made aware, advised will hear from scheduling dept to get it scheduled and to call back this office if any questions or concerns.

## 2019-06-01 ENCOUNTER — Inpatient Hospital Stay: Admit: 2019-06-01 | Payer: MEDICARE | Attending: Hematology & Oncology

## 2019-06-01 DIAGNOSIS — K6289 Other specified diseases of anus and rectum: Secondary | ICD-10-CM

## 2019-06-01 MED ORDER — GADOTERATE MEGLUMINE 0.5 MMOL/ML IV SOLUTION
0.5 mmol/mL (376.9 mg/mL) | Freq: Once | INTRAVENOUS | Status: AC
Start: 2019-06-01 — End: 2019-06-01
  Administered 2019-06-01: 19:00:00 via INTRAVENOUS

## 2019-06-02 NOTE — Telephone Encounter (Signed)
Patient would like MRI results. Please call patient @ [phone]

## 2019-06-03 NOTE — Telephone Encounter (Signed)
Patient called back that she really need her results. Patient is in the process of packing up & it is important that she she know her results so she can know what's up ahead.

## 2019-06-08 NOTE — Telephone Encounter (Signed)
Patient was told by Dr Myrtie Neither on Thursday afternoon that she would be called on Friday about her hotspot.  Patient extremely concerned. Please call asap.

## 2019-06-21 ENCOUNTER — Inpatient Hospital Stay: Admit: 2019-06-21 | Payer: MEDICARE | Attending: Psychiatry

## 2019-06-21 NOTE — Interval H&P Note (Signed)
Periop Notes  by Julieanne Manson, RN at 06/21/19 6644                Author: Julieanne Manson, RN  Service: --  Author Type: Registered Nurse       Filed: 06/21/19 0915  Date of Service: 06/21/19 0907  Status: Signed          Editor: Julieanne Manson, RN (Registered Nurse)                          [facility]   Preoperative Instructions            Surgery Date 06/22/19          Time of Arrival 1230      1. On the day of your surgery, please report to the Surgical Services Registration Desk and sign in at your designated time. The Surgery Center is located to the right of the Emergency Room.       2. You must have someone with you to drive you home. You should not drive a car for 24 hours following surgery. Please make arrangements for a friend or family member to stay with you for the first 24 hours after your surgery.      3. Do not have anything to eat or drink (including water, gum, mints, coffee, juice) after midnight ?06/21/19      .?This may not apply  to medications prescribed by your physician. ?(Please note below the special instructions with medications to take the morning  of your procedure.)      4. We recommend you do not drink any alcoholic beverages for 24 hours before and after your surgery.      5. Contact your surgeons office for instructions on the following medications: non-steroidal anti-inflammatory drugs (i.e. Advil, Aleve), vitamins, and supplements. (Some surgeons will want you to stop these medications prior to surgery  and others may allow you to take them)   **If you are currently taking Plavix, Coumadin, Aspirin and/or other blood-thinning agents, contact your surgeon for instructions.** Your surgeon will partner with the physician prescribing  these medications to determine if it is safe to stop or if you need to continue taking.  Please do not stop taking these medications without instructions from your surgeon      6. Wear comfortable clothes.  Wear glasses instead of  contacts.  Do not bring any money or jewelry. Please bring picture ID, insurance card, and any prearranged co-payment or hospital payment.  Do not wear make-up, particularly mascara the morning of  your surgery.  Do not wear nail polish, particularly if you are having foot /hand surgery.  Wear your hair loose or down, no ponytails, buns, bobby pins or clips.  All body piercings must be removed.   Please shower with antibacterial soap for three consecutive days before and on the morning of surgery, but do not apply any lotions, powders or deodorants after the shower on the day of surgery.  Please use a fresh towels after each shower. Please sleep in clean clothes and change bed linens the night before surgery.  Please do not shave for 48 hours prior to surgery. Shaving of the face is acceptable.      7. You should understand that if you do not follow these instructions your surgery may be cancelled.  If your physical condition changes (I.e. fever, cold or flu) please contact your surgeon as soon as possible.  8. It is important that you be on time.  If a situation occurs where you may be late, please call [phone] (OR Holding Area).      9. If you have any questions and or problems, please call [phone] (Pre-admission Testing).      10. Your surgery time may be subject to change.  You will receive a phone call the evening prior if your time changes.      11.  If having outpatient surgery, you must have someone to drive you here, stay with you during the duration of your stay, and to drive you home at time of discharge.      Special Instructions:       TAKE ALL MEDICATIONS DAY OF SURGERY EXCEPT:   Lisinopril, citrucel      I understand a pre-operative phone call will be made to verify my surgery time.  In the event that I am not available, I give permission for a message to be left on my answering service and/or with another person?  Yes              ___________________      __________   _________      (Signature of Patient)             (Witness)                (Date and Time)

## 2019-06-21 NOTE — Progress Notes (Signed)
Consent: The patient and/or their healthcare decision maker is aware that they may receive a bill for this audio only encounter, depending on their insurance coverage, and has provided verbal consent to proceed: Yes.  ??  INTERVAL HISTORY??(Audio Only): [name] is a 73 year old widowed white female following up with me??6??weeks??since her last??appointment re: significant symptoms of Major Depression as well as Generalized Anxiety disorder. 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 The Ativan and Vistaril both tend to make her drowsy so we tried Lexapro at only 5 mg (mouth ulcers) and last time started low-dose Zoloft.  ??  She??states??that??she's??been??feeling "pretty good" overall, and that??she's been much less anxious and no longer dysphoric compared to the last time. She feels the addition of the Zoloft has helped and she denies having any SE's with it at all. Another factor in her improvement is that she sold her house right away and will be closing very soon. More importantly, she's moving to [place], [place] later this week into a 43 and over community with lots of amenities. She also has one or more friends down there to begin with. She's slightly anxious about this, but it's clearly a positive for her and she's very pleased with the plan. Wants to continue to F/U with me until after the COVID issues are gone and she can easily see someone there, if  needed. Not using the Lorazepam quite as much--does have some tiredness with it.  ??  CURRENT MEDICATIONS:  1.????Effexor XR 37.5??mg daily  2. ??Lorazepam??0.5??mg prn--takes??0.5 mg less often, about one/day  3.????Zoloft 25 mg daily  ??  MENTAL STATUS EXAM:????Andrea [name]??was noted to be fairly??pleasant and cooperative,??and exhibited a more full affective range this time. She was??fairly??animated and no longer dysphoric or overly anxious. Her??neurovegetative functioning has been better as well, and she denied??any??SI or PDW thoughts whatsoever.??She denied having any manic or hypomanic symptoms??at all and there was no evidence of any psychosis such as hallucinations or delusional thinking,??and her thoughts were fairly organized and coherent. ??Her judgment and insight appear to be much??better??now??and cognitively her fund of knowledge is above average. ??  ??  DIAGNOSTIC IMPRESSION:  AXIS I: ??Major Depression, recurrent, very mild??(F33.0)   ????????????????????????Generalized??Anxiety disorder--better??(F41.1)  AXIS II: ??Deferred.  AXIS III: ??Lung CA (Adeno); hypertension; gastroesophageal reflux disease; rotator cuff repair.  ??  PLAN:  1.????Continue the??Effexor XR at only 37.5 mg daily--has 3 months of??refills (30 day).  2.????Continue??the Ativan??at??0.5 mg??bid??prn--says she has 1-2 refills (#60).  3.????Continue the Zoloft at 25 mg daily--has 2-3 months of refills (30 day).   4. ??Follow up with me in??6??weeks, sooner if needed. Will eventually follow up with MD in [place], [place] but wants to follow with me until COVID restrictions are over.  ??  I affirm this is a Patient-Initiated-Episode with a patient who has not had a related appointment within my department in the past 7 days or scheduled within the next 24 hours.  Total Time:??19??minutes  Note: not billable if the call serves to triage the patient into an appointment  for the relevant concern. Patient was evaluated by phone call and had no access to a computer or smart phone. Chart reviewed. Evaluated and  management per the note above. POS: patient at home; provider in office.

## 2019-06-21 NOTE — Progress Notes (Signed)
Consent: The patient and/or their healthcare decision maker is aware that they may receive a bill for this audio only encounter, depending on their insurance coverage, and has provided verbal consent to proceed: Yes.  ??  INTERVAL HISTORY??(Audio Only): Andrea [name] is a 73 year old widowed white female following up with me??6??weeks??since her last??appointment re: significant symptoms of Major Depression as well as Generalized Anxiety disorder. 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 The Ativan and Vistaril both tend to make her drowsy so we tried Lexapro at only 5 mg (mouth ulcers) and last time started low-dose Zoloft.  ??  She??states??that??she's??been??feeling "pretty good" overall, and that??she's been much less anxious and no longer dysphoric compared to the last time. She feels the addition of the Zoloft has helped and she denies having any SE's with it at all. Another factor in her improvement is that she sold her house right away and will be closing very soon. More importantly, she's moving to [place], [place] later this week into a 72 and over community with lots of amenities. She also has one or more friends down there to begin with. She's slightly anxious about this, but it's clearly a positive for her and she's very pleased with the plan. Wants to continue to F/U with me until after the COVID issues are gone and she can easily see  someone there, if needed. Not using the Lorazepam quite as much--does have some tiredness with it.  ??  CURRENT MEDICATIONS:  1.????Effexor XR 37.5??mg daily  2. ??Lorazepam??0.5??mg prn--takes??0.5 mg less often, about one/day  3.????Zoloft 25 mg daily  ??  MENTAL STATUS EXAM:????[name]??was noted to be fairly??pleasant and cooperative,??and exhibited a more full affective range this time. She was??fairly??animated and no longer dysphoric or overly anxious. Her??neurovegetative functioning has been better as well, and she denied??any??SI or PDW thoughts whatsoever.??She denied having any manic or hypomanic symptoms??at all and there was no evidence of any psychosis such as hallucinations or delusional thinking,??and her thoughts were fairly organized and coherent. ??Her judgment and insight appear to be much??better??now??and cognitively her fund of knowledge is above average. ??  ??  DIAGNOSTIC IMPRESSION:  AXIS I: ??Major Depression, recurrent, very mild??(F33.0)   ????????????????????????Generalized??Anxiety disorder--better??(F41.1)  AXIS II: ??Deferred.  AXIS III: ??Lung CA (Adeno); hypertension; gastroesophageal reflux disease; rotator cuff repair.  ??  PLAN:  1.????Continue the??Effexor XR at only 37.5 mg daily--has 3 months of??refills (30 day).  2.????Continue??the Ativan??at??0.5 mg??bid??prn--says she has 1-2 refills (#60).  3.????Continue the Zoloft at 25 mg daily--has 2-3 months of refills (30 day).   4. ??Follow up with me in??6??weeks, sooner if needed. Will eventually follow up with MD in [place], [place] but wants to follow with me until COVID restrictions are over.  ??  I affirm this is a Patient-Initiated-Episode with a patient who has not had a related appointment within my department in the past 7 days or scheduled within the next 24 hours.  Total Time:??19??minutes  Note: not billable if the call serves to triage the patient into an appointment  for the relevant concern. Patient was evaluated by phone call  and had no access to a computer or smart phone. Chart reviewed. Evaluated and management per the note above. POS: patient at home; provider in office.

## 2019-06-21 NOTE — Other (Signed)
[facility]  Preoperative Instructions        Surgery Date 06/22/19          Time of Arrival 1230    1. On the day of your surgery, please report to the Surgical Services Registration Desk and sign in at your designated time. The Surgery Center is located to the right of the Emergency Room.     2. You must have someone with you to drive you home. You should not drive a car for 24 hours following surgery. Please make arrangements for a friend or family member to stay with you for the first 24 hours after your surgery.    3. Do not have anything to eat or drink (including water, gum, mints, coffee, juice) after midnight ?06/21/19      .?This may not apply to medications prescribed by your physician. ?(Please note below the special instructions with medications to take the morning of your procedure.)    4. We recommend you do not drink any alcoholic beverages for 24 hours before and after your surgery.    5. Contact your surgeon???s office for instructions on the following medications: non-steroidal anti-inflammatory drugs (i.e. Advil, Aleve), vitamins, and supplements. (Some surgeon???s will want you to stop these medications prior to surgery and others may allow you to take them)  **If you are currently taking Plavix, Coumadin, Aspirin and/or other blood-thinning agents, contact your surgeon for instructions.** Your surgeon will partner with the physician prescribing these medications to determine if it is safe to stop or if you need to continue taking.  Please do not stop taking these medications without instructions from your surgeon    6. Wear comfortable clothes.  Wear glasses instead of contacts.  Do not bring any money or jewelry. Please bring picture ID, insurance card, and any prearranged co-payment or hospital payment.  Do not wear make-up, particularly mascara the morning of your surgery.  Do not wear nail polish, particularly if you are having foot /hand surgery.  Wear your hair  loose or down, no ponytails, buns, bobby pins or clips.  All body piercings must be removed.  Please shower with antibacterial soap for three consecutive days before and on the morning of surgery, but do not apply any lotions, powders or deodorants after the shower on the day of surgery. Please use a fresh towels after each shower. Please sleep in clean clothes and change bed linens the night before surgery.  Please do not shave for 48 hours prior to surgery. Shaving of the face is acceptable.    7. You should understand that if you do not follow these instructions your surgery may be cancelled.  If your physical condition changes (I.e. fever, cold or flu) please contact your surgeon as soon as possible.    8. It is important that you be on time.  If a situation occurs where you may be late, please call [phone] (OR Holding Area).    9. If you have any questions and or problems, please call [phone] (Pre-admission Testing).    10. Your surgery time may be subject to change.  You will receive a phone call the evening prior if your time changes.    11.  If having outpatient surgery, you must have someone to drive you here, stay with you during the duration of your stay, and to drive you home at time of discharge.    Special Instructions:     TAKE ALL MEDICATIONS DAY OF SURGERY  EXCEPT:  Lisinopril, citrucel    I understand a pre-operative phone call will be made to verify my surgery time.  In the event that I am not available, I give permission for a message to be left on my answering service and/or with another person?  Yes          ___________________      __________   _________    (Signature of Patient)             (Witness)                (Date and Time)

## 2019-06-22 ENCOUNTER — Inpatient Hospital Stay: Payer: MEDICARE

## 2019-06-22 LAB — POC CHEM8
ANION GAP ISTAT,IAGAP: 18 mmol/L (ref 10–20)
Anion gap (POC): 18 mmol/L (ref 10–20)
BUN (POC): 9 mg/dL (ref 9–20)
BUN ISTAT,IBUN: 9 mg/dL (ref 9–20)
CHLORIDE ISTAT,ICL: 101 mmol/L (ref 98–107)
CO2 (POC): 22 mmol/L (ref 21–32)
Calcium, ionized (POC): 1.11 mmol/L — ABNORMAL LOW (ref 1.12–1.32)
Calcium, ionized, POC: 1.11 mmol/L — ABNORMAL LOW (ref 1.12–1.32)
Chloride (POC): 101 mmol/L (ref 98–107)
Creatinine (POC): 0.5 mg/dL — ABNORMAL LOW (ref 0.6–1.3)
GFR African American: >60 mL/min/{1.73_m2} (ref >60)
GFR Non-African American: >60 mL/min/{1.73_m2} (ref >60)
GFRAA, POC: >60 mL/min/{1.73_m2} (ref >60)
GFRNA, POC: >60 mL/min/{1.73_m2} (ref >60)
GLUCOSE ISTAT,IGLU: 108 mg/dL — ABNORMAL HIGH (ref 65–100)
Glucose (POC): 108 mg/dL — ABNORMAL HIGH (ref 65–100)
HEMATOCRIT ISTAT,IHCT: 36 % (ref 35.0–47.0)
Hematocrit (POC): 36 % (ref 35.0–47.0)
POC Creatinine: 0.5 mg/dL — ABNORMAL LOW (ref 0.6–1.3)
POTASSIUM ISTAT,IK: 3.5 mmol/L (ref 3.5–5.1)
Potassium (POC): 3.5 mmol/L (ref 3.5–5.1)
SODIUM ISTAT,INA: 137 mmol/L (ref 136–145)
Sodium (POC): 137 mmol/L (ref 136–145)
TCO2 ISTAT,ITCO2: 22 mmol/L (ref 21–32)

## 2019-06-22 MED ORDER — HYDROMORPHONE (PF) 2 MG/ML IJ SOLN
2 mg/mL | INTRAMUSCULAR | Status: DC | PRN
Start: 2019-06-22 — End: 2019-06-22

## 2019-06-22 MED ORDER — ONDANSETRON (PF) 4 MG/2 ML INJECTION
4 mg/2 mL | INTRAMUSCULAR | Status: DC | PRN
Start: 2019-06-22 — End: 2019-06-22
  Administered 2019-06-22: 12:00:00 via INTRAVENOUS

## 2019-06-22 MED ORDER — LACTATED RINGERS IV
INTRAVENOUS | Status: DC
Start: 2019-06-22 — End: 2019-06-22
  Administered 2019-06-22: 11:00:00 via INTRAVENOUS

## 2019-06-22 MED ORDER — FENTANYL CITRATE (PF) 50 MCG/ML IJ SOLN
50 mcg/mL | INTRAMUSCULAR | Status: DC | PRN
Start: 2019-06-22 — End: 2019-06-22

## 2019-06-22 MED ORDER — PROPOFOL 10 MG/ML IV EMUL
10 mg/mL | INTRAVENOUS | Status: AC
Start: 2019-06-22 — End: ?

## 2019-06-22 MED ORDER — BUPIVACAINE-EPINEPHRINE (PF) 0.25 %-1:200,000 IJ SOLN
0.25 %-1:200,000 | INTRAMUSCULAR | Status: AC
Start: 2019-06-22 — End: ?

## 2019-06-22 MED ORDER — SUCCINYLCHOLINE CHLORIDE 200 MG/10 ML (20 MG/ML) INTRAVENOUS SYRINGE
200 mg/10 mL (20 mg/mL) | INTRAVENOUS | Status: AC
Start: 2019-06-22 — End: ?

## 2019-06-22 MED ORDER — FENTANYL CITRATE (PF) 50 MCG/ML IJ SOLN
50 mcg/mL | INTRAMUSCULAR | Status: DC | PRN
Start: 2019-06-22 — End: 2019-06-22
  Administered 2019-06-22 (×2): via INTRAVENOUS

## 2019-06-22 MED ORDER — ONDANSETRON (PF) 4 MG/2 ML INJECTION
4 mg/2 mL | INTRAMUSCULAR | Status: AC
Start: 2019-06-22 — End: ?

## 2019-06-22 MED ORDER — SODIUM CHLORIDE 0.9 % IJ SYRG
Freq: Three times a day (TID) | INTRAMUSCULAR | Status: DC
Start: 2019-06-22 — End: 2019-06-22

## 2019-06-22 MED ORDER — LACTATED RINGERS IV
INTRAVENOUS | Status: DC
Start: 2019-06-22 — End: 2019-06-22

## 2019-06-22 MED ORDER — DEXAMETHASONE SODIUM PHOSPHATE 4 MG/ML IJ SOLN
4 mg/mL | INTRAMUSCULAR | Status: DC | PRN
Start: 2019-06-22 — End: 2019-06-22
  Administered 2019-06-22: 12:00:00 via INTRAVENOUS

## 2019-06-22 MED ORDER — LIDOCAINE (PF) 20 MG/ML (2 %) IJ SOLN
20 mg/mL (2 %) | INTRAMUSCULAR | Status: AC
Start: 2019-06-22 — End: ?

## 2019-06-22 MED ORDER — ALCOHOL 62% (NOZIN) NASAL SANITIZER
62 % | Freq: Once | CUTANEOUS | Status: AC
Start: 2019-06-22 — End: 2019-06-22
  Administered 2019-06-22: 11:00:00 via TOPICAL

## 2019-06-22 MED ORDER — BUPIVACAINE-EPINEPHRINE (PF) 0.25 %-1:200,000 IJ SOLN
0.25 %-1:200,000 | INTRAMUSCULAR | Status: DC | PRN
Start: 2019-06-22 — End: 2019-06-22
  Administered 2019-06-22: 12:00:00 via SUBCUTANEOUS

## 2019-06-22 MED ORDER — SODIUM CHLORIDE 0.9 % IJ SYRG
INTRAMUSCULAR | Status: DC | PRN
Start: 2019-06-22 — End: 2019-06-22

## 2019-06-22 MED ORDER — LIDOCAINE (PF) 20 MG/ML (2 %) IJ SOLN
20 mg/mL (2 %) | INTRAMUSCULAR | Status: DC | PRN
Start: 2019-06-22 — End: 2019-06-22
  Administered 2019-06-22: 11:00:00 via INTRAVENOUS

## 2019-06-22 MED ORDER — FENTANYL CITRATE (PF) 50 MCG/ML IJ SOLN
50 mcg/mL | INTRAMUSCULAR | Status: AC
Start: 2019-06-22 — End: ?

## 2019-06-22 MED ORDER — PROPOFOL 10 MG/ML IV EMUL
10 mg/mL | INTRAVENOUS | Status: DC | PRN
Start: 2019-06-22 — End: 2019-06-22
  Administered 2019-06-22: 11:00:00 via INTRAVENOUS

## 2019-06-22 MED ORDER — ONDANSETRON (PF) 4 MG/2 ML INJECTION
4 mg/2 mL | INTRAMUSCULAR | Status: DC | PRN
Start: 2019-06-22 — End: 2019-06-22

## 2019-06-22 MED ORDER — MORPHINE 10 MG/ML INJ SOLUTION
10 mg/ml | INTRAMUSCULAR | Status: DC | PRN
Start: 2019-06-22 — End: 2019-06-22

## 2019-06-22 MED ORDER — OXYCODONE 5 MG TAB
5 mg | ORAL_TABLET | ORAL | 0 refills | Status: AC | PRN
Start: 2019-06-22 — End: 2019-06-25

## 2019-06-22 MED ORDER — MEPERIDINE (PF) 25 MG/ML INJ SOLUTION
25 mg/ml | INTRAMUSCULAR | Status: DC | PRN
Start: 2019-06-22 — End: 2019-06-22

## 2019-06-22 MED ORDER — DIPHENHYDRAMINE HCL 50 MG/ML IJ SOLN
50 mg/mL | INTRAMUSCULAR | Status: DC | PRN
Start: 2019-06-22 — End: 2019-06-22

## 2019-06-22 MED ORDER — DEXAMETHASONE SODIUM PHOSPHATE 4 MG/ML IJ SOLN
4 mg/mL | INTRAMUSCULAR | Status: AC
Start: 2019-06-22 — End: ?

## 2019-06-22 MED ORDER — METHYLENE BLUE (ANTIDOTE) 5 MG/ML (0.5 %) INTRAVENOUS SOLUTION
5 mg/mL | INTRAVENOUS | Status: AC
Start: 2019-06-22 — End: ?

## 2019-06-22 MED FILL — LACTATED RINGERS IV: INTRAVENOUS | Qty: 1000

## 2019-06-22 MED FILL — PROPOFOL 10 MG/ML IV EMUL: 10 mg/mL | INTRAVENOUS | Qty: 20

## 2019-06-22 MED FILL — BUPIVACAINE-EPINEPHRINE (PF) 0.25 %-1:200,000 IJ SOLN: 0.25 %-1:200,000 | INTRAMUSCULAR | Qty: 30

## 2019-06-22 MED FILL — DEXAMETHASONE SODIUM PHOSPHATE 4 MG/ML IJ SOLN: 4 mg/mL | INTRAMUSCULAR | Qty: 1

## 2019-06-22 MED FILL — ALCOHOL 62% (NOZIN) NASAL SANITIZER: 62 % | CUTANEOUS | Qty: 3

## 2019-06-22 MED FILL — PROVAYBLUE 5 MG/ML INTRAVENOUS SOLUTION: 5 mg/mL | INTRAVENOUS | Qty: 20

## 2019-06-22 MED FILL — FENTANYL CITRATE (PF) 50 MCG/ML IJ SOLN: 50 mcg/mL | INTRAMUSCULAR | Qty: 2

## 2019-06-22 MED FILL — LIDOCAINE (PF) 20 MG/ML (2 %) IJ SOLN: 20 mg/mL (2 %) | INTRAMUSCULAR | Qty: 5

## 2019-06-22 MED FILL — ONDANSETRON (PF) 4 MG/2 ML INJECTION: 4 mg/2 mL | INTRAMUSCULAR | Qty: 2

## 2019-06-22 MED FILL — SUCCINYLCHOLINE CHLORIDE 200 MG/10 ML (20 MG/ML) INTRAVENOUS SYRINGE: 200 mg/10 mL (20 mg/mL) | INTRAVENOUS | Qty: 10

## 2019-06-22 NOTE — Interval H&P Note (Signed)
Handoff Report from Operating Room to PACU    Report received from C. Izola Price and Bea Laura Mesadieu CRNA regarding Andrea [name].      Surgeon(s):  Manfred Shirts, MD  And Procedure(s) (LRB):  exam of anesthesia,  biopsy of distal rectum and anus (N/A)  confirmed   with allergies, dressings and local anesthetic discussed.    Anesthesia type, drugs, patient history, complications, estimated blood loss, vital signs, intake and output, and last pain medication, lines and temperature were reviewed.

## 2019-06-22 NOTE — Anesthesia Post-Procedure Evaluation (Signed)
Procedure(s):  exam of anesthesia,  biopsy of distal rectum and anus.    general    Anesthesia Post Evaluation        Patient location during evaluation: PACU  Note status: Adequate.  Level of consciousness: responsive to verbal stimuli and sleepy but conscious  Pain management: satisfactory to patient  Airway patency: patent  Anesthetic complications: no  Cardiovascular status: acceptable  Respiratory status: acceptable  Hydration status: acceptable  Comments: +Post-Anesthesia Evaluation and Assessment    Patient: Andrea [name] MRN: [id number]  SSN: [id number]   Date of Birth: October 24, 1945  Age: 73 y.o.  Sex: female      Cardiovascular Function/Vital Signs    BP (!) 122/52    Pulse 75    Temp 36.9 ??C (98.5 ??F)    Resp 18    Ht 5' 1.5" (1.562 m)    Wt 67.6 kg (149 lb 0.5 oz)    SpO2 98%    BMI 27.70 kg/m??     Patient is status post Procedure(s):  exam of anesthesia,  biopsy of distal rectum and anus.    Nausea/Vomiting: Controlled.    Postoperative hydration reviewed and adequate.    Pain:  Pain Scale 1: Numeric (0 - 10) (06/22/19 0830)  Pain Intensity 1: 0 (06/22/19 0830)   Managed.    Neurological Status:   Neuro (WDL): Within Defined Limits (06/22/19 0900)   At baseline.    Mental Status and Level of Consciousness: Arousable.    Pulmonary Status:   O2 Device: Room air (06/22/19 0830)   Adequate oxygenation and airway patent.    Complications related to anesthesia: None    Post-anesthesia assessment completed. No concerns.    Signed By: Brunilda Payor, MD    06/22/2019  Post anesthesia nausea and vomiting:  controlled      INITIAL Post-op Vital signs:   Vitals Value Taken Time   BP 122/52 06/22/2019  8:45 AM   Temp 36.9 ??C (98.5 ??F) 06/22/2019  8:30 AM   Pulse 70 06/22/2019  8:48 AM   Resp 17 06/22/2019  8:48 AM   SpO2 95 % 06/22/2019  8:48 AM   Vitals shown include unvalidated device data.

## 2019-06-22 NOTE — Op Note (Signed)
Brief Postoperative Note    Patient: Andrea [name]  Date of Birth: Aug 22, 1946  MRN: [id number]    Date of Procedure: 06/22/2019     Pre-Op Diagnosis: ABNORMAL FINDINGS ON PET/CT SCAN    Post-Op Diagnosis: Same as preoperative diagnosis.      Procedure(s):  exam of anesthesia,  biopsy of distal rectum and anus    Surgeon(s):  Coury, Waldo Laine, MD    Surgical Assistant: None    Anesthesia: General     Estimated Blood Loss (mL): Minimal    Complications: None    Specimens:   ID Type Source Tests Collected by Time Destination   1 : Distal vaginal biospy  Preservative Biopsy  Gaetana Michaelis, MD 06/22/2019 [phone] Pathology   2 : Distal rectal biopsy Preservative Biopsy  Gaetana Michaelis, MD 06/22/2019 [phone] Pathology        Implants: * No implants in log *    Drains: * No LDAs found *    Findings: Normal anorectal mucosa.    Electronically Signed by Gaetana Michaelis, MD on 06/22/2019 at 7:51 AM

## 2019-06-22 NOTE — Anesthesia Pre-Procedure Evaluation (Signed)
Relevant Problems   No relevant active problems       Anesthetic History   No history of anesthetic complications            Review of Systems / Medical History  Patient summary reviewed, nursing notes reviewed and pertinent labs reviewed    Pulmonary          Smoker         Neuro/Psych         Psychiatric history     Cardiovascular    Hypertension        Dysrhythmias       Exercise tolerance: >4 METS  Comments: RBBB   GI/Hepatic/Renal     GERD           Endo/Other        Arthritis and cancer (lung)     Other Findings   Comments: Upper lobectomy           Physical Exam    Airway  Mallampati: I  TM Distance: 4 - 6 cm  Neck ROM: normal range of motion   Mouth opening: Normal     Cardiovascular  Regular rate and rhythm,  S1 and S2 normal,  no murmur, click, rub, or gallop             Dental  No notable dental hx       Pulmonary  Breath sounds clear to auscultation               Abdominal  GI exam deferred       Other Findings            Anesthetic Plan    ASA: 2  Anesthesia type: general            Anesthetic plan and risks discussed with: Patient      LMA is OK

## 2019-06-22 NOTE — Op Note (Signed)
[facility]  OPERATIVE REPORT    Name:  Andrea [name], Andrea [name]  MR#:  [id number]  DOB:  January 20, 1946  ACCOUNT #:  [id number]  DATE OF SERVICE:  06/22/2019    PREOPERATIVE DIAGNOSIS:  Abnormal findings on PET CT scan.    POSTOPERATIVE DIAGNOSIS:  Abnormal findings on PET CT scan.    PROCEDURE PERFORMED:  Exam under anesthesia and biopsy of the distal rectum, anus and vagina.    SURGEON:  Russella Dar, MD    ASSISTANT:  Vivianne Spence.    ANESTHESIA:  General.    COMPLICATIONS:  None.    SPECIMENS REMOVED:  Distal vaginal biopsy and distal rectal biopsy.    IMPLANTS:  None.    ESTIMATED BLOOD LOSS:  Minimal.    DRAINS:  None.    FINDINGS:  Normal anal rectal mucosa.    INDICATIONS:  This is a 73 year old female, who presents with an abnormal finding on PET CT scan.  She has no symptoms whatsoever, but the PET CT showed hypermetabolic activity circumferentially around the distal rectum and anal canal.  On exam, in the office, there were no abnormalities, however, in light of these findings, I felt that a biopsy is warranted to rule out any deep underlying malignancy.  I explained the risks and benefits of the surgery including, but not limited to bleeding, infection and damage to surrounding structures.  She understood all the risks and elected to proceed.    DESCRIPTION OF PROCEDURE:  The patient was brought to the operating suite and placed in the supine position.  General anesthesia was induced with LMA.  She was then placed in a lithotomy position and her buttocks and vaginal area were prepped and draped in the usual sterile fashion.  A time-out was performed indicating the correct patient and procedure to be performed as per hospital protocol.    I began by examining the vaginal canal, placing a speculum in the vaginal canal and looking at the vaginal mucosa circumferentially, I do not see any significant abnormalities, the cervix from what I could see appeared normal.  The distal vaginal had  some slight irregularity along the mucosa.  I did take a biopsy of this and this was sent to pathology for further examination.  I then placed a Fansler anoscope in the anal canal and the anal canal was inspected in its entirety.  The anorectal mucosa appeared completely normal with no irregularities or masses felt.  I did take two biopsies of the distal rectum and anal canal and biopsies were deep taking some muscle tissue to ensure adequate sample from what was seen on the PET CT scan.  Biopsies were performed using electrocautery.  Specimens were removed and sent to pathology for further examination.  Hemostasis was achieved.  30 mL of 0.25% Marcaine with epinephrine was injected subcutaneously.  The patient was awakened and taken to the recovery room in stable condition.        Russella Dar, MD      [id number]  D:  06/22/2019 7:56  T:  06/22/2019 11:21  JOB #:  [id number]

## 2019-06-22 NOTE — Interval H&P Note (Signed)
Covid test not collected  - urgent case denies s/s of Covid 19 virus

## 2019-06-22 NOTE — Progress Notes (Signed)
 Patient discharged to home.  Iv removed.  Discharge instructions, scripts, and medication education done with patient.

## 2019-06-22 NOTE — Other (Signed)
Covid test not collected  - urgent case denies s/s of Covid 19 virus

## 2019-06-22 NOTE — Anesthesia Pre-Procedure Evaluation (Addendum)
Relevant Problems   No relevant active problems       Anesthetic History   No history of anesthetic complications            Review of Systems / Medical History  Patient summary reviewed, nursing notes reviewed and pertinent labs reviewed    Pulmonary          Smoker         Neuro/Psych         Psychiatric history     Cardiovascular    Hypertension        Dysrhythmias       Exercise tolerance: >4 METS  Comments: RBBB   GI/Hepatic/Renal     GERD           Endo/Other        Arthritis and cancer (lung)     Other Findings   Comments: Upper lobectomy           Physical Exam    Airway  Mallampati: I  TM Distance: 4 - 6 cm  Neck ROM: normal range of motion   Mouth opening: Normal     Cardiovascular  Regular rate and rhythm,  S1 and S2 normal,  no murmur, click, rub, or gallop             Dental  No notable dental hx       Pulmonary  Breath sounds clear to auscultation               Abdominal  GI exam deferred       Other Findings            Anesthetic Plan    ASA: 2  Anesthesia type: general            Anesthetic plan and risks discussed with: Patient      LMA is OK

## 2019-06-22 NOTE — Anesthesia Post-Procedure Evaluation (Signed)
Procedure(s):  exam of anesthesia,  biopsy of distal rectum and anus.    general    Anesthesia Post Evaluation        Patient location during evaluation: PACU  Note status: Adequate.  Level of consciousness: responsive to verbal stimuli and sleepy but conscious  Pain management: satisfactory to patient  Airway patency: patent  Anesthetic complications: no  Cardiovascular status: acceptable  Respiratory status: acceptable  Hydration status: acceptable  Comments: +Post-Anesthesia Evaluation and Assessment    Patient: Andrea [name] MRN: [id number]  SSN: [id number]   Date of Birth: 17-Nov-1945  Age: 73 y.o.  Sex: female      Cardiovascular Function/Vital Signs    BP (!) 122/52    Pulse 75    Temp 36.9 ??C (98.5 ??F)    Resp 18    Ht 5' 1.5" (1.562 m)    Wt 67.6 kg (149 lb 0.5 oz)    SpO2 98%    BMI 27.70 kg/m??     Patient is status post Procedure(s):  exam of anesthesia,  biopsy of distal rectum and anus.    Nausea/Vomiting: Controlled.    Postoperative hydration reviewed and adequate.    Pain:  Pain Scale 1: Numeric (0 - 10) (06/22/19 0830)  Pain Intensity 1: 0 (06/22/19 0830)   Managed.    Neurological Status:   Neuro (WDL): Within Defined Limits (06/22/19 0900)   At baseline.    Mental Status and Level of Consciousness: Arousable.    Pulmonary Status:   O2 Device: Room air (06/22/19 0830)   Adequate oxygenation and airway patent.    Complications related to anesthesia: None    Post-anesthesia assessment completed. No concerns.    Signed By: Brunilda Payor, MD    06/22/2019  Post anesthesia nausea and vomiting:  controlled      INITIAL Post-op Vital signs:   Vitals Value Taken Time   BP 122/52 06/22/2019  8:45 AM   Temp 36.9 ??C (98.5 ??F) 06/22/2019  8:30 AM   Pulse 70 06/22/2019  8:48 AM   Resp 17 06/22/2019  8:48 AM   SpO2 95 % 06/22/2019  8:48 AM   Vitals shown include unvalidated device data.

## 2019-06-22 NOTE — Op Note (Signed)
[facility]  OPERATIVE REPORT    Name:  LADENA, JACQUEZ  MR#:  [id number]  DOB:  03/16/1946  ACCOUNT #:  [id number]  DATE OF SERVICE:  06/22/2019    PREOPERATIVE DIAGNOSIS:  Abnormal findings on PET CT scan.    POSTOPERATIVE DIAGNOSIS:  Abnormal findings on PET CT scan.    PROCEDURE PERFORMED:  Exam under anesthesia and biopsy of the distal rectum, anus and vagina.    SURGEON:  Russella Dar, MD    ASSISTANT:  Vivianne Spence.    ANESTHESIA:  General.    COMPLICATIONS:  None.    SPECIMENS REMOVED:  Distal vaginal biopsy and distal rectal biopsy.    IMPLANTS:  None.    ESTIMATED BLOOD LOSS:  Minimal.    DRAINS:  None.    FINDINGS:  Normal anal rectal mucosa.    INDICATIONS:  This is a 73 year old female, who presents with an abnormal finding on PET CT scan.  She has no symptoms whatsoever, but the PET CT showed hypermetabolic activity circumferentially around the distal rectum and anal canal.  On exam, in the office, there were no abnormalities, however, in light of these findings, I felt that a biopsy is warranted to rule out any deep underlying malignancy.  I explained the risks and benefits of the surgery including, but not limited to bleeding, infection and damage to surrounding structures.  She understood all the risks and elected to proceed.    DESCRIPTION OF PROCEDURE:  The patient was brought to the operating suite and placed in the supine position.  General anesthesia was induced with LMA.  She was then placed in a lithotomy position and her buttocks and vaginal area were prepped and draped in the usual sterile fashion.  A time-out was performed indicating the correct patient and procedure to be performed as per hospital protocol.    I began by examining the vaginal canal, placing a speculum in the vaginal canal and looking at the vaginal mucosa circumferentially, I do not see any significant abnormalities, the cervix from what I could see appeared normal.  The distal vaginal had  some slight irregularity along the mucosa.  I did take a biopsy of this and this was sent to pathology for further examination.  I then placed a Fansler anoscope in the anal canal and the anal canal was inspected in its entirety.  The anorectal mucosa appeared completely normal with no irregularities or masses felt.  I did take two biopsies of the distal rectum and anal canal and biopsies were deep taking some muscle tissue to ensure adequate sample from what was seen on the PET CT scan.  Biopsies were performed using electrocautery.  Specimens were removed and sent to pathology for further examination.  Hemostasis was achieved.  30 mL of 0.25% Marcaine with epinephrine was injected subcutaneously.  The patient was awakened and taken to the recovery room in stable condition.        Russella Dar, MD      [id number]  D:  06/22/2019 7:56  T:  06/22/2019 11:21  JOB #:  [id number]

## 2019-06-22 NOTE — Progress Notes (Signed)
Patient discharged to home.  Iv removed.  Discharge instructions, scripts, and medication education done with patient.

## 2019-06-22 NOTE — Brief Op Note (Signed)
Brief Postoperative Note    Patient: MONTANNA MCBAIN  Date of Birth: 1946-08-27  MRN: [id number]    Date of Procedure: 06/22/2019     Pre-Op Diagnosis: ABNORMAL FINDINGS ON PET/CT SCAN    Post-Op Diagnosis: Same as preoperative diagnosis.      Procedure(s):  exam of anesthesia,  biopsy of distal rectum and anus    Surgeon(s):  Coury, Waldo Laine, MD    Surgical Assistant: None    Anesthesia: General     Estimated Blood Loss (mL): Minimal    Complications: None    Specimens:   ID Type Source Tests Collected by Time Destination   1 : Distal vaginal biospy  Preservative Biopsy  Gaetana Michaelis, MD 06/22/2019 [phone] Pathology   2 : Distal rectal biopsy Preservative Biopsy  Gaetana Michaelis, MD 06/22/2019 [phone] Pathology        Implants: * No implants in log *    Drains: * No LDAs found *    Findings: Normal anorectal mucosa.    Electronically Signed by Gaetana Michaelis, MD on 06/22/2019 at 7:51 AM

## 2019-06-22 NOTE — Other (Signed)
Handoff Report from Operating Room to PACU    Report received from C. Nance Pew and Johnette Abraham Mesadieu CRNA regarding Andrea [name].      Surgeon(s):  Gaetana Michaelis, MD  And Procedure(s) (LRB):  exam of anesthesia,  biopsy of distal rectum and anus (N/A)  confirmed   with allergies, dressings and local anesthetic discussed.    Anesthesia type, drugs, patient history, complications, estimated blood loss, vital signs, intake and output, and last pain medication, lines and temperature were reviewed.

## 2019-07-19 ENCOUNTER — Encounter: Attending: Family

## 2019-07-19 ENCOUNTER — Telehealth

## 2019-07-19 MED ORDER — LORAZEPAM 0.5 MG TAB
0.5 mg | ORAL_TABLET | Freq: Two times a day (BID) | ORAL | 0 refills | Status: DC | PRN
Start: 2019-07-19 — End: 2019-08-02

## 2019-07-19 MED ORDER — LORAZEPAM 0.5 MG TAB
0.5 mg | ORAL_TABLET | Freq: Two times a day (BID) | ORAL | 0 refills | Status: DC | PRN
Start: 2019-07-19 — End: 2019-07-19

## 2019-07-26 ENCOUNTER — Ambulatory Visit: Payer: MEDICARE | Attending: Family

## 2019-07-26 NOTE — Telephone Encounter (Signed)
Called patient to advise her that, per Opal Sidles, she does not need to be seen by our office anymore.    She is followed by Dr. Myrtie Neither and Dr. Richrd Prime.    The patient verbalized understanding and verified that she only needs to see Dr. Myrtie Neither at the intervals that he chooses for follow up since we have cleared her on our end.    I verified this & told her that Dr. Myrtie Neither would send her back to [place] in the future if need be.    The patient verbalized understanding of all.

## 2019-08-02 ENCOUNTER — Inpatient Hospital Stay: Admit: 2019-08-02 | Payer: MEDICARE | Attending: Psychiatry

## 2019-08-02 DIAGNOSIS — F411 Generalized anxiety disorder: Secondary | ICD-10-CM

## 2019-08-02 MED ORDER — LORAZEPAM 0.5 MG TAB
0.5 mg | ORAL_TABLET | Freq: Two times a day (BID) | ORAL | 5 refills | Status: DC | PRN
Start: 2019-08-02 — End: 2019-12-29

## 2019-08-02 MED ORDER — SERTRALINE 25 MG TAB
25 mg | ORAL_TABLET | Freq: Every day | ORAL | 5 refills | Status: DC
Start: 2019-08-02 — End: 2020-05-09

## 2019-08-02 MED ORDER — VENLAFAXINE SR 37.5 MG 24 HR CAP
37.5 mg | ORAL_CAPSULE | Freq: Every day | ORAL | 5 refills | Status: DC
Start: 2019-08-02 — End: 2020-05-09

## 2019-08-02 NOTE — Progress Notes (Signed)
Consent: The patient and/or their healthcare decision maker is aware that they may receive a bill for this audio only encounter, depending on their insurance coverage, and has provided verbal consent to proceed: Yes.  ??  INTERVAL HISTORY??(Audio Only): Andrea [name] is a 73 year old widowed white female following up with me??6??weeks??since her last??appointment re: significant symptoms of Major Depression as well as Generalized Anxiety disorder. 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 The Ativan??and Vistaril both??tend??to make her drowsy so we tried Lexapro at only 5 mg (mouth ulcers) and then started low-dose Zoloft which was more helpful.  ??  She??states??that??she's??been??feeling "better" overall, and that??she's fully moved to [place], [place] which has been a positive change for her. She reports feeling less depressed and that even her BP is lower. She's meeting people and already had a few friends in the are to begin with, and she hasn't had any real negative encounters or issues so far. She's still concerned about needing some possible income at some point, but overall her worries are less significant now.  She hasn't had any N/V dysfunction to speak of and also denies having any SE's with the meds at this point. Wants to continue to F/U with me until after the COVID issues are gone and she can easily see someone there, if needed. Still not using the Lorazepam quite as much--does have  some mild tiredness with it.  ??  CURRENT MEDICATIONS:  1.????Effexor XR 37.5??mg daily  2. ??Lorazepam??0.5??mg prn--takes??0.5 mg??less often, about one/day  3.????Zoloft 25 mg daily  ??  MENTAL STATUS EXAM:????[name]??was noted to be fairly??pleasant and cooperative,??and exhibited a fairly full affective range again this time. She was??fairly??animated??and not dysphoric??or anxious at all. Her??neurovegetative functioning has been quite good as well, and she denied??any??SI or PDW thoughts whatsoever.??She denied having any manic or hypomanic symptoms??at all and there was no evidence of any psychosis such as hallucinations or delusional thinking. Her thoughts were fairly organized and coherent, and her judgment and insight appear to be much??better??now??and cognitively her fund of knowledge is above average. ??  ??  DIAGNOSTIC IMPRESSION:  AXIS I: ??Major Depression, recurrent,??very mild??(F33.0)   ????????????????????????Generalized??Anxiety disorder--better??(F41.1)  AXIS II: ??Deferred.  AXIS III: ??Lung CA (Adeno); hypertension; gastroesophageal reflux disease; rotator cuff repair.  ??  PLAN:  1.????Continue the??Effexor XR at 37.5 mg daily--#30 with??5 refills??(30 day).  2.????Continue??the Ativan??at??0.5 mg??bid??prn--#60 with 5 refills (#60).  3.????Continue the Zoloft??at 25 mg??daily--#30 with 5 refills (30 day).   4. ??Follow up with me in??6-7??weeks, sooner if needed. Will eventually follow up with MD in [employer], [place] but wants to follow with me until COVID restrictions are over.  ??  I affirm this is a Patient-Initiated-Episode with a patient who has not had a related appointment within my department in the past 7 days or scheduled within the next 24 hours.  Total Time:??19??minutes  Note: not billable if the call serves to triage the patient into an appointment for the relevant concern. Patient was evaluated by phone call and had no access to a computer or smart phone. Chart reviewed. Evaluated  and management per the note above. POS: patient at home; provider in  office.

## 2019-08-02 NOTE — Progress Notes (Signed)
Consent: The patient and/or their healthcare decision maker is aware that they may receive a bill for this audio only encounter, depending on their insurance coverage, and has provided verbal consent to proceed: Yes.  ??  INTERVAL HISTORY??(Audio Only): [name] is a 73 year old widowed white female following up with me??6??weeks??since her last??appointment re: significant symptoms of Major Depression as well as Generalized Anxiety disorder. 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 The Ativan??and Vistaril both??tend??to make her drowsy so we tried Lexapro at only 5 mg (mouth ulcers) and then started low-dose Zoloft which was more helpful.  ??  She??states??that??she's??been??feeling "better" overall, and that??she's fully moved to [place], [place] which has been a positive change for her. She reports feeling less depressed and that even her BP is lower. She's meeting people and already had a few friends in the are to begin with, and she hasn't had any real negative encounters or issues so far. She's still concerned about needing some possible income at some point, but overall her worries are less significant now.  She hasn't had any N/V dysfunction to speak of and also denies having any SE's with the meds at this point. Wants to continue to F/U with me until after the COVID issues are gone and she can easily see someone there, if needed. Still not using the Lorazepam quite as  much--does have some mild tiredness with it.  ??  CURRENT MEDICATIONS:  1.????Effexor XR 37.5??mg daily  2. ??Lorazepam??0.5??mg prn--takes??0.5 mg??less often, about one/day  3.????Zoloft 25 mg daily  ??  MENTAL STATUS EXAM:????Andrea [name]??was noted to be fairly??pleasant and cooperative,??and exhibited a fairly full affective range again this time. She was??fairly??animated??and not dysphoric??or anxious at all. Her??neurovegetative functioning has been quite good as well, and she denied??any??SI or PDW thoughts whatsoever.??She denied having any manic or hypomanic symptoms??at all and there was no evidence of any psychosis such as hallucinations or delusional thinking. Her thoughts were fairly organized and coherent, and her judgment and insight appear to be much??better??now??and cognitively her fund of knowledge is above average. ??  ??  DIAGNOSTIC IMPRESSION:  AXIS I: ??Major Depression, recurrent,??very mild??(F33.0)   ????????????????????????Generalized??Anxiety disorder--better??(F41.1)  AXIS II: ??Deferred.  AXIS III: ??Lung CA (Adeno); hypertension; gastroesophageal reflux disease; rotator cuff repair.  ??  PLAN:  1.????Continue the??Effexor XR at 37.5 mg daily--#30 with??5 refills??(30 day).  2.????Continue??the Ativan??at??0.5 mg??bid??prn--#60 with 5 refills (#60).  3.????Continue the Zoloft??at 25 mg??daily--#30 with 5 refills (30 day).   4. ??Follow up with me in??6-7??weeks, sooner if needed. Will eventually follow up with MD in [place], [place] but wants to follow with me until COVID restrictions are over.  ??  I affirm this is a Patient-Initiated-Episode with a patient who has not had a related appointment within my department in the past 7 days or scheduled within the next 24 hours.  Total Time:??19??minutes  Note: not billable if the call serves to triage the patient into an appointment for the relevant concern. Patient was evaluated by phone call and had no access to a computer or smart phone. Chart reviewed. Evaluated  and management per the note above. POS: patient at home; provider in office.

## 2019-09-13 ENCOUNTER — Inpatient Hospital Stay: Admit: 2019-09-13 | Payer: MEDICARE | Attending: Psychiatry

## 2019-09-13 NOTE — Progress Notes (Signed)
Consent: The patient and/or their healthcare decision maker is aware that they may receive a bill for this audio only encounter, depending on their insurance coverage, and has provided verbal consent to proceed: Yes.  ??  INTERVAL HISTORY??(Audio Only): Andrea [name] is a 73 year old widowed white female following up with me??6??weeks??since her last??appointment re: significant symptoms of Major Depression as well as Generalized Anxiety disorder. 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 The Ativan??and Vistaril both??tend??to make her drowsy??so??we tried??Lexapro at only 5 mg (mouth ulcers) and then started low-dose Zoloft which was more helpful. She's since moved to [place], [place] which has been a positive for her.  ??  She??states??that??she's??been??feeling "happy as a clam"??overall, and that??she's still very pleased since having moved to [place], [place]. She's been very busy which has been very therapeutic for her. She hasn't had any depressive sx's to speak of and her N/V functioning has been quite good. She's meeting people and already had a few friends in the area to begin with. Her only stressor has been having developed a hypertrophic scab on her left heel which has kept her from doing a number of activities. Despite this, she's not had any flare up of dysphoria or anxiety and she recognizes how much better she's feeling and coping at this point. 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 She denies having any SE's with the meds at this point and we discussed the eventual plan to taper down/off the AD's, likely starting with the Zoloft first. She wants to wait on this, which I agree with. Still wants to continue to F/U with me until after the COVID issues are gone and she can easily see someone there, but she's had trouble finding a PCP, much less a psychiatrist. Still not using??the Lorazepam quite as much--does have some mild tiredness with it.  ??  CURRENT MEDICATIONS:  1.????Effexor XR 37.5??mg daily  2. ??Lorazepam??0.5??mg prn--takes??0.5 mg??less??often,??about one/day  3.????Zoloft 25??mg daily  ??  MENTAL STATUS EXAM:????Andrea [name]??was noted to be very??pleasant and cooperative,??and??exhibited a??fairly full affective range again this time.??She was??animated??and euthymic, and not anxious at all. Her??neurovegetative functioning has still been??quite good, and she denied??any??SI or PDW thoughts??whatsoever.??She denied having any manic or hypomanic symptoms??at all??and there was no evidence of any psychosis such as hallucinations or delusional thinking. Her thoughts were fairly organized and coherent, and her judgment and insight appear to be much??better??now??and cognitively her fund of knowledge is above average. ??  ??  DIAGNOSTIC IMPRESSION:  AXIS I: ??Major Depression, recurrent,??in partial remission??(F33.41)   ????????????????????????Generalized??Anxiety disorder--better??(F41.1)  AXIS II: ??Deferred.  AXIS III: ??Lung CA (Adeno); hypertension; gastroesophageal reflux disease; rotator cuff repair.  ??  PLAN:  1.????Continue the??Effexor XR at 37.5 mg daily--has 4.5 months of refills??(30 day).  2.????Continue??the Ativan??at??0.5 mg??bid??prn--has 5 months of refills (#60).  3.????Continue the??Zoloft??at 25 mg??daily--has 4.5 months of refills??(30 day). May D/C this in the early Spring.   4. ??Follow up with me in??6-7??weeks, sooner if needed.??Will eventually follow up  with MD in [place], [place] but wants to follow with me until COVID restrictions are over.  ??  I affirm this is a Patient-Initiated-Episode with a patient who has not had a related appointment within my department in the past 7 days or scheduled within the next 24 hours.  Total Time:??19??minutes  Note: not billable if the call serves to triage the patient into an appointment for the relevant concern. Patient was evaluated by phone call and had no access to a computer or smart phone. Chart reviewed. Evaluated and management per the note above. POS: patient at home; provider in office.

## 2019-09-13 NOTE — Progress Notes (Signed)
Consent: The patient and/or their healthcare decision maker is aware that they may receive a bill for this audio only encounter, depending on their insurance coverage, and has provided verbal consent to proceed: Yes.  ??  INTERVAL HISTORY??(Audio Only): [name] is a 73 year old widowed white female following up with me??6??weeks??since her last??appointment re: significant symptoms of Major Depression as well as Generalized Anxiety disorder. 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 The Ativan??and Vistaril both??tend??to make her drowsy??so??we tried??Lexapro at only 5 mg (mouth ulcers) and then started low-dose Zoloft which was more helpful. She's since moved to [place], [place] which has been a positive for her.  ??   She??states??that??she's??been??feeling "happy as a clam"??overall, and that??she's still very pleased since having moved to [place], [place]. She's been very busy which has been very therapeutic for her. She hasn't had any depressive sx's to speak of and her N/V functioning has been quite good. She's meeting people and already had a few friends in the area to begin with. Her only stressor has been having developed a hypertrophic scab on her left heel which has kept her from doing a number of activities. Despite this, she's not had any flare up of dysphoria or anxiety and she recognizes how much better she's feeling and coping at this point. She may also have a possible source of income--some company may want her to broker the distribution of it's products in the this country. She denies having any SE's with the meds at this point and we discussed the eventual plan to taper down/off the AD's, likely starting with the Zoloft first. She wants to wait on this, which I agree with. Still wants to continue to F/U with me until after the COVID issues are gone and she can easily see someone there, but she's had trouble finding a PCP, much less a psychiatrist. Still not using??the Lorazepam quite as much--does have some mild tiredness with it.  ??  CURRENT MEDICATIONS:  1.????Effexor XR 37.5??mg daily  2. ??Lorazepam??0.5??mg prn--takes??0.5 mg??less??often,??about one/day  3.????Zoloft 25??mg daily  ??   MENTAL STATUS EXAM:????Andrea [name]??was noted to be very??pleasant and cooperative,??and??exhibited a??fairly full affective range again this time.??She was??animated??and euthymic, and not anxious at all. Her??neurovegetative functioning has still been??quite good, and she denied??any??SI or PDW thoughts??whatsoever.??She denied having any manic or hypomanic symptoms??at all??and there was no evidence of any psychosis such as hallucinations or delusional thinking. Her thoughts were fairly organized and coherent, and her judgment and insight appear to be much??better??now??and cognitively her fund of knowledge is above average. ??  ??  DIAGNOSTIC IMPRESSION:  AXIS I: ??Major Depression, recurrent,??in partial remission??(F33.41)   ????????????????????????Generalized??Anxiety disorder--better??(F41.1)  AXIS II: ??Deferred.  AXIS III: ??Lung CA (Adeno); hypertension; gastroesophageal reflux disease; rotator cuff repair.  ??  PLAN:  1.????Continue the??Effexor XR at 37.5 mg daily--has 4.5 months of refills??(30 day).  2.????Continue??the Ativan??at??0.5 mg??bid??prn--has 5 months of refills (#60).  3.????Continue the??Zoloft??at 25 mg??daily--has 4.5 months of refills??(30 day). May D/C this in the early Spring.   4. ??Follow up with me in??6-7??weeks, sooner if needed.??Will eventually follow up  with MD in [place], [place] but wants to follow with me until COVID restrictions are over.  ??  I affirm this is a Patient-Initiated-Episode with a patient who has not had a related appointment within my department in the past 7 days or scheduled within the next 24 hours.  Total Time:??19??minutes  Note: not billable if the call serves to triage the patient into an appointment for the relevant concern. Patient was evaluated by phone call and had no access to a computer or smart phone. Chart reviewed. Evaluated and management per the note above. POS: patient at home; provider in office.

## 2019-11-08 ENCOUNTER — Inpatient Hospital Stay: Admit: 2019-11-08 | Payer: MEDICARE | Attending: Psychiatry

## 2019-11-08 NOTE — Progress Notes (Signed)
Consent: The patient and/or their healthcare decision maker is aware that they may receive a bill for this audio only encounter, depending on their insurance coverage, and has provided verbal consent to proceed: Yes.  ??  INTERVAL HISTORY??(Audio Only): [name] is a 74 year old widowed white female following up with me??8??weeks??since her last??appointment re: significant symptoms of Major Depression as well as Generalized Anxiety disorder. 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 We then restarted low-dose Zoloft which has been more helpful coupled with her move to [place], [place] which has been a real positive for her.  ??  She??states??that??she's??been??feeling even better than the last time because she met a man and has started up a relationship, which was entirely unexpected. She's been "very happy" and euthymic, and she hasn't had any depressive sx's at all since moving to [facility]. Her N/V functioning has been quite good as well, and she's been much more active and social than before. She's not had any overt anxiety issues at all and hasn't been taking the Ativan hardly at all. She was also able to get an appt with a PCP (early March) so that's been a relief as well. She denies having any SE's with the meds and we discussed the plan to taper down/off the AD's, which she wants to do now. Will taper off the Zoloft over 1-2 weeks and likely stop the Effexor in a couple of months if indicated. Hopes to be off any AD so  she won't need to F/U with a psychiatrist down there.  ??  CURRENT MEDICATIONS:  1.????Effexor XR 37.5??mg daily  2. ??Lorazepam??0.5??mg prn--taking infrequently now  3.????Zoloft 25??mg daily  ??  MENTAL STATUS EXAM:????Andrea [name]??was noted to be very??pleasant and cooperative,??and??exhibited a??full affective range with no depression at all. She was??animated??and euthymic, and not experiencing any anxiety??at all. Her??neurovegetative functioning has still been??quite good, and she denied??any??SI or PDW thoughts??whatsoever.??She denied having any manic or hypomanic symptoms??at all??and there was no evidence of any psychosis such as hallucinations or delusional thinking. Her thoughts were fairly organized and coherent, and her judgment and insight appear to be much??better??now??and cognitively her fund of knowledge is above average. ??  ??  DIAGNOSTIC IMPRESSION:  AXIS I: ??Major Depression, recurrent,??in partial remission??(F33.41)   ????????????????????????Generalized??Anxiety disorder--much better??(F41.1)   AXIS II: ??Deferred.  AXIS III: ??Lung CA (Adeno); hypertension; gastroesophageal reflux disease; rotator cuff repair.  ??  PLAN:  1.????Continue the??Effexor XR at 37.5 mg daily--has 3 months of??refills??(30 day). Will likely stop this next time.  2.????Continue??the Ativan??at??0.5 mg??bid??prn--has 4+ months of??refills (#60).  3.????Taper off the??Zoloft??taking 12.5 mg for 1-2 weeks, then stopping it.    4. ??Follow up with me in??8??weeks, sooner if needed.??Will eventually follow up with MD in [place], [place] but wants to follow with me until COVID restrictions are over.  ??  I affirm this is a Patient-Initiated-Episode with a patient who has not had a related appointment within my department in the past 7 days or scheduled within the next 24 hours.  Total Time:??17??minutes  Note: not billable if the call serves to triage the patient into an appointment for  the relevant concern. Patient was evaluated by phone call and had no access to a computer or smart phone. Chart  reviewed. Evaluated and management per the note above. POS: patient at home; provider in office.

## 2019-11-08 NOTE — Progress Notes (Signed)
Consent: The patient and/or their healthcare decision maker is aware that they may receive a bill for this audio only encounter, depending on their insurance coverage, and has provided verbal consent to proceed: Yes.  ??  INTERVAL HISTORY??(Audio Only): [name] is a 74 year old widowed white female following up with me??8??weeks??since her last??appointment re: significant symptoms of Major Depression as well as Generalized Anxiety disorder. 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 We then restarted low-dose Zoloft which has been more helpful coupled with her move to [place], [place] which has been a real positive for her.  ??   She??states??that??she's??been??feeling even better than the last time because she met a man and has started up a relationship, which was entirely unexpected. She's been "very happy" and euthymic, and she hasn't had any depressive sx's at all since moving to [facility]. Her N/V functioning has been quite good as well, and she's been much more active and social than before. She's not had any overt anxiety issues at all and hasn't been taking the Ativan hardly at all. She was also able to get an appt with a PCP (early March) so that's been a relief as well. She denies having any SE's with the meds and we discussed the plan to taper down/off the AD's, which she wants to do now. Will taper off the Zoloft over 1-2 weeks and likely stop the Effexor in a couple of months if indicated. Hopes to be off any AD so she won't need to F/U with a psychiatrist down there.  ??  CURRENT MEDICATIONS:  1.????Effexor XR 37.5??mg daily  2. ??Lorazepam??0.5??mg prn--taking infrequently now  3.????Zoloft 25??mg daily  ??  MENTAL STATUS EXAM:????Andrea [name]??was noted to be very??pleasant and cooperative,??and??exhibited a??full affective range with no depression at all. She was??animated??and euthymic, and not experiencing any anxiety??at all. Her??neurovegetative functioning has still been??quite good, and she denied??any??SI or PDW thoughts??whatsoever.??She denied having any manic or hypomanic symptoms??at all??and there was no evidence of any psychosis such as hallucinations or delusional thinking. Her thoughts were fairly organized and coherent, and her judgment and insight appear to be much??better??now??and cognitively her fund of knowledge is above average. ??  ??  DIAGNOSTIC IMPRESSION:  AXIS I: ??Major Depression, recurrent,??in partial remission??(F33.41)   ????????????????????????Generalized??Anxiety disorder--much better??(F41.1)   AXIS II: ??Deferred.  AXIS III: ??Lung CA (Adeno); hypertension; gastroesophageal reflux disease; rotator cuff repair.  ??  PLAN:   1.????Continue the??Effexor XR at 37.5 mg daily--has 3 months of??refills??(30 day). Will likely stop this next time.  2.????Continue??the Ativan??at??0.5 mg??bid??prn--has 4+ months of??refills (#60).  3.????Taper off the??Zoloft??taking 12.5 mg for 1-2 weeks, then stopping it.    4. ??Follow up with me in??8??weeks, sooner if needed.??Will eventually follow up with MD in [place], [place] but wants to follow with me until COVID restrictions are over.  ??  I affirm this is a Patient-Initiated-Episode with a patient who has not had a related appointment within my department in the past 7 days or scheduled within the next 24 hours.  Total Time:??17??minutes  Note: not billable if the call serves to triage the patient into an appointment  for the relevant concern. Patient was evaluated by phone call and had no access to a computer or smart phone. Chart reviewed. Evaluated and management per the note above. POS: patient at home; provider in office.

## 2019-12-09 IMAGING — CT PET CT SCAN TUMOR IMAGING SKULL TO THIGH
4 series · 25 of 25 positions shown · non-contrast
Comparison: None available

PET CT SCAN TUMOR IMAGING SKULL TO THIGH, 12/09/2019 [DATE]: 
CLINICAL INDICATION:  Left lung cancer, status post left upper lobectomy January 17, 2019. Prior PET examination showed 3.4 cm left upper lobe mass with FDG 
avidity, also FDG uptake in the distal rectum.
TECHNIQUE: A dose of 11.1 millicuries of 18-FDG was administered intravenously 
and skull to thigh PET scanning was performed at 60 minutes. Tomographic scans 
were reconstructed in axial, coronal, and sagittal projections. The data was 
reconstructed into a three-dimensional volume rendered images and reviewed in a 
rotational cine loop. Serum blood glucose at the time of injection was 118 
mg/dl.

[Series 201: body-low dose ct, idose (4) · axial · 4.0mm · 1.17mm/px · z∈[-778,+70]mm · 8 of 213 slices shown]
[im 1/213]
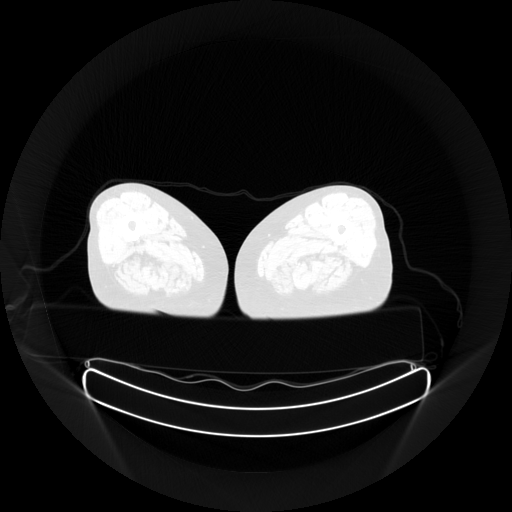
[im 31/213]
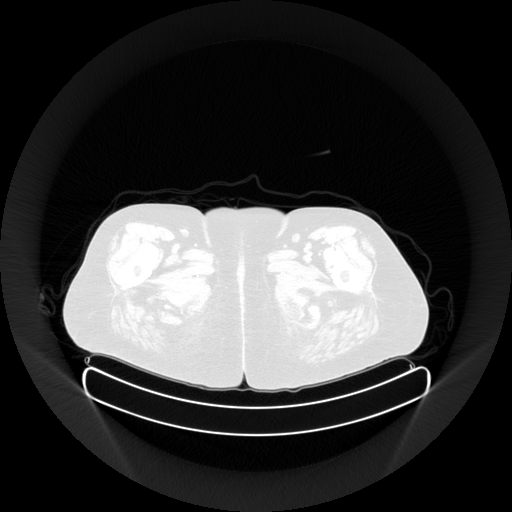
[im 61/213]
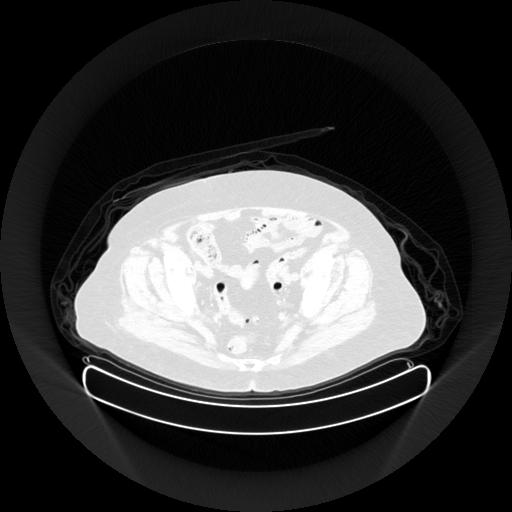
[im 91/213]
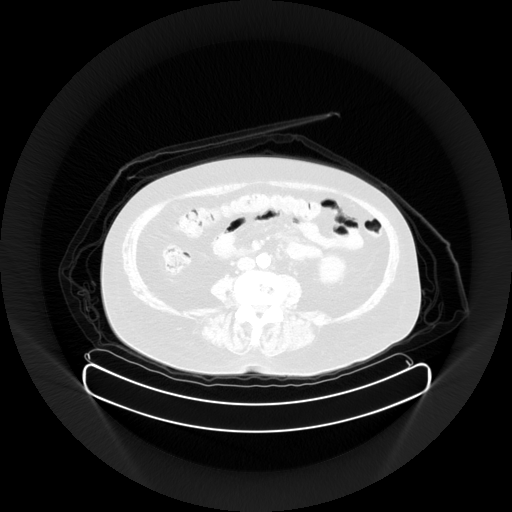
[im 122/213]
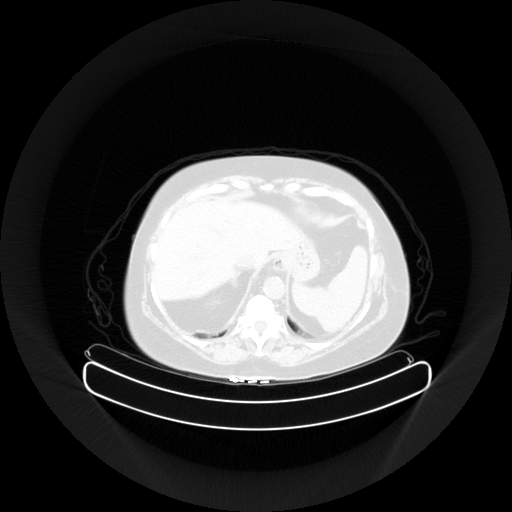
[im 152/213]
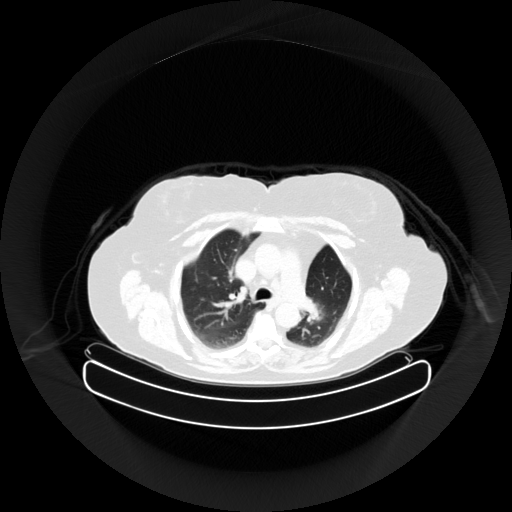
[im 182/213]
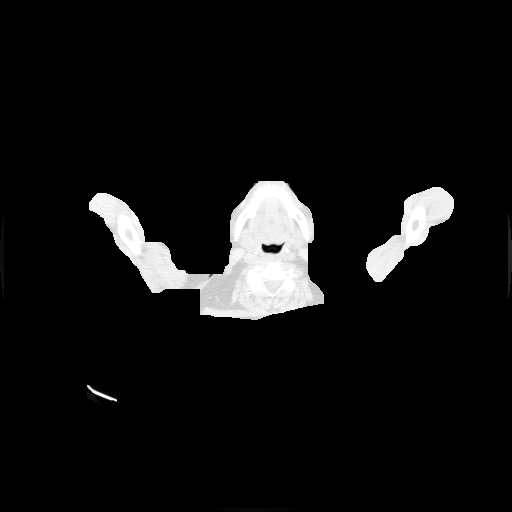
[im 213/213]
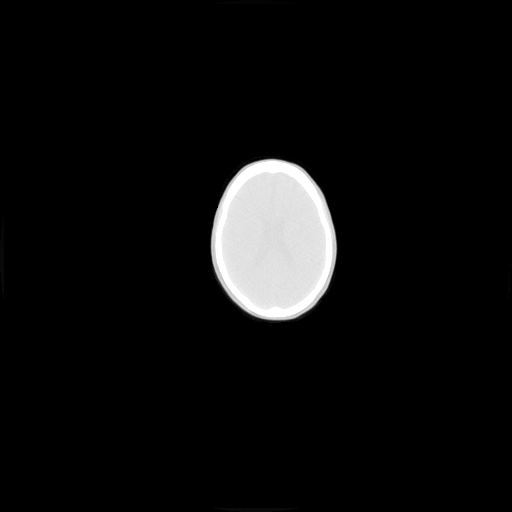

[Series 202: lung ct, idose (2) · axial · 2.0mm · 1.17mm/px · z∈[-280,-100]mm · 3 of 91 slices shown]
[im 1/91]
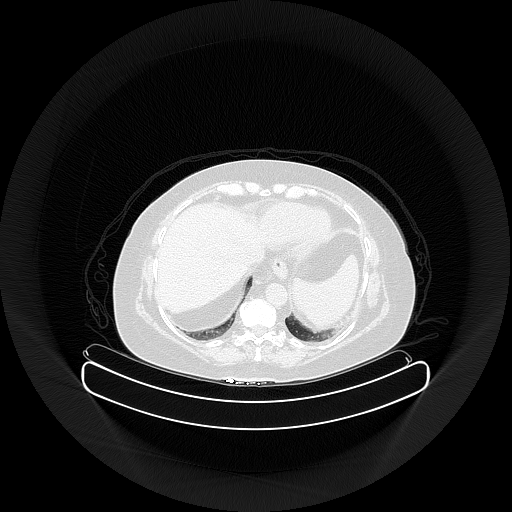
[im 46/91]
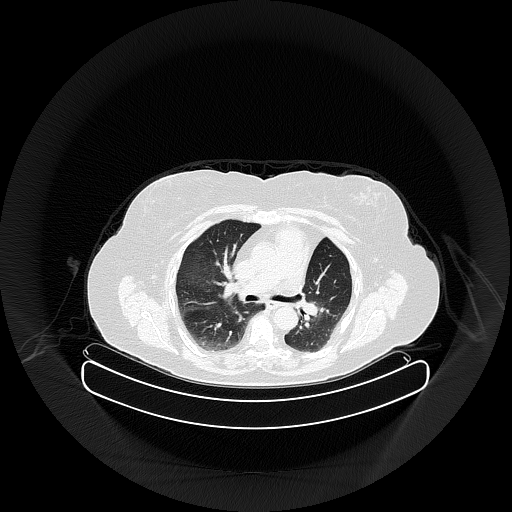
[im 91/91]
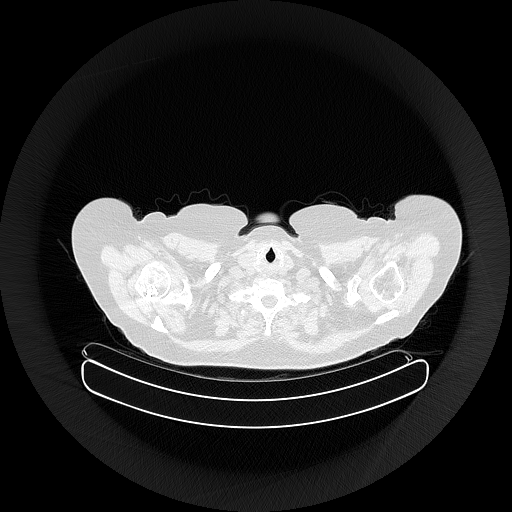

[Series 2580: (wb_nac) body · axial · 4.0mm · 4.00mm/px · z∈[-778,+70]mm · 7 of 213 slices shown]
[im 1/213]
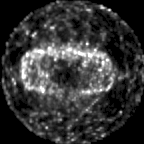
[im 36/213]
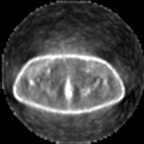
[im 71/213]
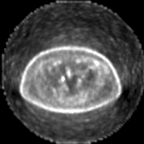
[im 107/213]
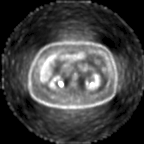
[im 142/213]
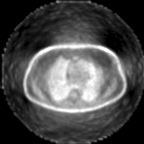
[im 177/213]
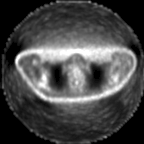
[im 213/213]
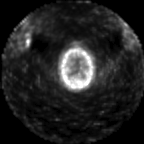

[Series 2581: (wb_ctac) body · axial · 4.0mm · 4.00mm/px · z∈[-778,+70]mm · 7 of 213 slices shown]
[im 1/213]
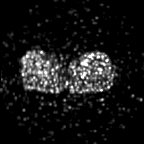
[im 36/213]
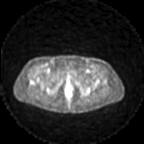
[im 71/213]
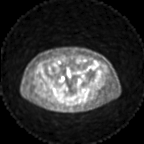
[im 107/213]
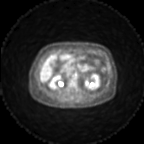
[im 142/213]
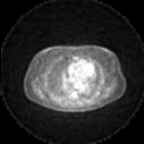
[im 177/213]
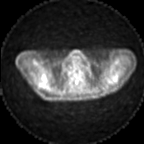
[im 213/213]
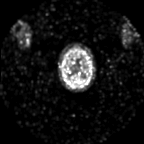

[25 of 25 positions shown; findings below may reference images not displayed]

FINDINGS: Today's CT images shows a 3 mm nodule in the left lower lobe, image 
70, lacking FDG activity which could be related to small size. There is linear 
scarring at the left base.. There is indistinct interstitial density in the 
perihilar zones. There is no abnormal pulmonary FDG uptake. No abnormal activity 
within cervical, note bearing chains. Mild activity in the right submandibular 
gland is nonspecific. There is mild activity within a left axillary lymph node, 
CT image 58 measuring 1.5 x 0.7 cm, maximum SUV 1.9. Faint activity in small 
left axillary lymph node seen on this image 52. There is a small focus of FDG 
activity, maximum SUV 2.7, in the pretracheal mediastinum, CT image 57 
corresponding to a 13 x 7 mm lymph node. This is nonspecific. No other 
mediastinal nodal activity. There is mild activity in the thoracic esophagus 
which could indicate esophagitis. There is a small hiatal hernia. 
There is physiologic myocardial and urinary activity. No concerning 
infradiaphragmatic uptake apart from the region of the rectum where maximum SUV 
is 6.9. This area is poorly delineated lacking contrast, an underlying soft 
tissue mass cannot be excluded. Correlation necessary. 
There is no abnormal activity pertaining to the breasts. There is low-level 
activity in the spine which is likely degenerative. CT images show no discrete 
blastic or lytic osseous lesion. 
There appear to be small calcified uterine fibroids. There is mild sigmoid 
diverticulosis. There are arteriosclerotic and degenerative changes.
IMPRESSION: 3 mm left lower lobe nodule. There is no abnormal pulmonary FDG uptake. 
FDG activity in a 13 x 7 mm pretracheal lymph node is nonspecific. 
Mild activity in a 15 x 7 mm left axillary lymph node, also nonspecific. Further 
surveillance follow-up of these findings recommended. 
Activity in the rectum. Clinical correlation necessary. Enhanced MRI of the 
pelvis would be useful for further evaluation if indicated. 
Mild activity in the thoracic esophagus could indicate esophagitis/GERD. There 
is a small hiatal hernia. 
Mild activity in the right submandibular gland, maximum SUV 4.0. This is 
nonspecific, and could be physiologic or possibly reflect mild sialoadenitis. No 
enlargement or discrete mass identified, but if indicated enhanced MRI or CT of 
the neck would be useful.

## 2019-12-29 ENCOUNTER — Encounter

## 2019-12-29 MED ORDER — LORAZEPAM 0.5 MG TAB
0.5 mg | ORAL_TABLET | Freq: Two times a day (BID) | ORAL | 2 refills | Status: DC | PRN
Start: 2019-12-29 — End: 2020-04-25

## 2020-01-04 ENCOUNTER — Inpatient Hospital Stay: Admit: 2020-01-04 | Payer: MEDICARE | Attending: Psychiatry

## 2020-01-04 NOTE — Progress Notes (Signed)
Consent: The patient and/or their healthcare decision maker is aware that they may receive a bill for this audio only encounter, depending on their insurance coverage, and has provided verbal consent to proceed: Yes.  ??  INTERVAL HISTORY??(Audio Only): [name] is a 74 year old widowed white female following up with me??8??weeks??since her last??appointment re: significant symptoms of Major Depression as well as Generalized Anxiety disorder. 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 We then restarted low-dose Zoloft which was more helpful coupled with her move to [place], [place] which has been a real positive for her. Because of the improvement, she's tapered off the Zoloft starting around the last appointment.  ??  She??states??that??she's??been??feeling "pretty good" still, although she had a couple of medical "scares" since the last appointment (vaso-vagal syncope and a CA-recurrence scare) which have triggered some increased anxiety presently. She also has realized that she often has a dip in her mood after talking with her daughter, who tends to dismiss her issues and medical concerns as if she doesn't believe the patient or is "not good enough". She came off the Zoloft fully without any issues, but she realizes that she should stay on the Effexor for now given theses couple of episodes. We discussed Rx options at length and will continue the same regimen for now. Still taking the Ativan infrequently, but it does  help when she takes it. She??denies having any SE's with the meds as well.  ??  CURRENT MEDICATIONS:  1.????Effexor XR 37.5??mg daily  2. ??Lorazepam??0.5??mg prn--taking infrequently  3.???? 25??mg daily--off this  ??  MENTAL STATUS EXAM:????Andrea [name]??was noted to be??very??pleasant and cooperative,??and??exhibited a??full affective range with only some very mild depressive symptoms noted. She was??animated??and??euthymic, and not experiencing much anxiety??presently. Her??neurovegetative functioning has??still??been??good, and she denied??any??SI or PDW thoughts??whatsoever.??She denied having any manic or hypomanic symptoms??at all??and there was no evidence of any psychosis such as hallucinations or delusional thinking. Her thoughts were fairly organized and coherent, and her judgment and insight appear to be much??better??now??and cognitively her fund of knowledge is above average. ??  ??  DIAGNOSTIC IMPRESSION:  AXIS I:  ??Major Depression, recurrent,??in partial remission??(F33.41)   ???????????????????????? Generalized??Anxiety disorder (F41.1)   AXIS II: ??Deferred.  AXIS III: ??Lung CA (Adeno); hypertension; gastroesophageal reflux disease; rotator cuff repair.  ??  PLAN:  1.????Continue the??Effexor XR at 37.5 mg daily--has 3 months of??refills??(90 day). May stop this in the near future.  2.????Continue??the Ativan??at??0.5 mg??bid??prn--has 3 months of??refills (#60).  3.????Follow up with me in??8??weeks, sooner if needed.??Will eventually follow up with MD in [place], [place] but wants to follow with me until COVID restrictions are over.  ??  I affirm this is a Patient-Initiated-Episode with a patient who has not had a related appointment within my department in the past 7 days or scheduled within the next 24 hours.  Total Time:??28??minutes  Note: not billable if the call serves to triage the patient into an appointment for the relevant concern. Patient was evaluated by phone call and had no access to a computer or smart phone. Chart reviewed. Evaluated and management per the note  above. POS: patient  at home; provider in office.

## 2020-01-04 NOTE — Progress Notes (Signed)
Consent: The patient and/or their healthcare decision maker is aware that they may receive a bill for this audio only encounter, depending on their insurance coverage, and has provided verbal consent to proceed: Yes.  ??  INTERVAL HISTORY??(Audio Only): [name] is a 74 year old widowed white female following up with me??8??weeks??since her last??appointment re: significant symptoms of Major Depression as well as Generalized Anxiety disorder. 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 We then restarted low-dose Zoloft which was more helpful coupled with her move to [place], [place] which has been a real positive for her. Because of the improvement, she's tapered off the Zoloft starting around the last appointment.  ??  She??states??that??she's??been??feeling "pretty good" still, although she had a couple of medical "scares" since the last appointment (vaso-vagal syncope and a CA-recurrence scare) which have triggered some increased anxiety presently. She also has realized that she often has a dip in her mood after talking with her daughter, who tends to dismiss her issues and medical concerns as if she doesn't believe the patient or is "not good enough". She came off the Zoloft fully without any issues, but she realizes that she should stay on the Effexor for now given theses couple of episodes. We discussed Rx options at length and will continue the same regimen for now. Still taking the Ativan infrequently, but it does  help when she takes it. She??denies having any SE's with the meds as well.  ??  CURRENT MEDICATIONS:  1.????Effexor XR 37.5??mg daily  2. ??Lorazepam??0.5??mg prn--taking infrequently  3.???? 25??mg daily--off this  ??  MENTAL STATUS EXAM:????Andrea [name]??was noted to be??very??pleasant and cooperative,??and??exhibited a??full affective range with only some very mild depressive symptoms noted. She was??animated??and??euthymic, and not experiencing much anxiety??presently. Her??neurovegetative functioning has??still??been??good, and she denied??any??SI or PDW thoughts??whatsoever.??She denied having any manic or hypomanic symptoms??at all??and there was no evidence of any psychosis such as hallucinations or delusional thinking. Her thoughts were fairly organized and coherent, and her judgment and insight appear to be much??better??now??and cognitively her fund of knowledge is above average. ??  ??  DIAGNOSTIC IMPRESSION:  AXIS I:  ??Major Depression, recurrent,??in partial remission??(F33.41)   ???????????????????????? Generalized??Anxiety disorder (F41.1)   AXIS II: ??Deferred.  AXIS III: ??Lung CA (Adeno); hypertension; gastroesophageal reflux disease; rotator cuff repair.  ??  PLAN:  1.????Continue the??Effexor XR at 37.5 mg daily--has 3 months of??refills??(90 day). May stop this in the near future.  2.????Continue??the Ativan??at??0.5 mg??bid??prn--has 3 months of??refills (#60).  3.????Follow up with me in??8??weeks, sooner if needed.??Will eventually follow up with MD in [place], [place] but wants to follow with me until COVID restrictions are over.  ??  I affirm this is a Patient-Initiated-Episode with a patient who has not had a related appointment within my department in the past 7 days or scheduled within the next 24 hours.  Total Time:??28??minutes  Note: not billable if the call serves to triage the patient into an appointment for the relevant concern. Patient was evaluated by phone call and had no access to a computer or smart phone. Chart reviewed. Evaluated and management per the note  above. POS: patient  at home; provider in office.

## 2020-03-01 ENCOUNTER — Ambulatory Visit: Payer: MEDICARE | Attending: Psychiatry

## 2020-03-01 NOTE — Progress Notes (Incomplete)
Consent: The patient and/or their healthcare decision maker is aware that they may receive a bill for this audio only encounter, depending on their insurance coverage, and has provided verbal consent to proceed: Yes.  ??  INTERVAL HISTORY??(Audio Only): Andrea [name] is a 74 year old widowed white female following up with me??8??weeks??since her last??appointment re: significant symptoms of Major Depression as well as Generalized Anxiety disorder. 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 We then restarted low-dose Zoloft which??was??more helpful coupled with her??move to [place], [place] which has been a real??positive for her. Because of the improvement, she tapered off the Zoloft about 3-4 months ago.  ??  She??states??that??she's??been??feeling??"pretty good" still, although she had a couple of medical "scares" since the last appointment (vaso-vagal syncope and a CA-recurrence scare) which have triggered some increased anxiety presently. She also has realized that she often has a dip in her mood after talking with her daughter, who tends to dismiss her issues and medical concerns as if she doesn't believe the patient or is "not good enough". She came off the Zoloft fully without any issues, but she realizes that she should stay on the Effexor for now given theses couple of episodes. We discussed Rx options at length and will continue the same regimen for now. Still taking the Ativan infrequently, but it does help when she  takes it. She??denies having any SE's with the meds??as well.  ??  CURRENT MEDICATIONS:  1.????Effexor XR 37.5??mg daily  2. ??Lorazepam??0.5??mg prn--taking infrequently  ??  MENTAL STATUS EXAM:????Esty??was noted to be??very??pleasant and cooperative,??and??exhibited a??full affective range??with only some very mild depressive symptoms noted. She??was??animated??and??euthymic, and not??experiencing much??anxiety??presently. Her??neurovegetative functioning has??still??been??good, and she denied??any??SI or PDW thoughts??whatsoever.??She denied having any manic or hypomanic symptoms??at all??and there was no evidence of any psychosis such as hallucinations or delusional thinking. Her thoughts were fairly organized and coherent, and her judgment and insight appear to be much??better??now??and cognitively her fund of knowledge is above average. ??  ??  DIAGNOSTIC IMPRESSION:  AXIS I:  ??Major Depression, recurrent,??in partial remission??(F33.41)   ???????????????????????? Generalized??Anxiety disorder (F41.1)   AXIS II: ??Deferred.  AXIS III: ??Lung CA (Adeno); hypertension; gastroesophageal reflux disease; rotator cuff repair.  ??  PLAN:  1.????Continue the??Effexor XR at 37.5 mg daily--has??1??months of??refills??(90 day).??May stop this in the near future.  2.????Continue??the Ativan??at??0.5 mg??bid??prn--has??3??months of??refills (#60).  3.????Follow up with me in??8??weeks, sooner if needed.??Will eventually follow up with MD in [place], [place] but wants to follow with me until COVID restrictions are over.  ??  I affirm this is a Patient-Initiated-Episode with a patient who has not had a related appointment within my department in the past 7 days or scheduled within the next 24 hours.  Total Time:??28??minutes  Note: not billable if the call serves to triage the patient into an appointment for the relevant concern. Patient was evaluated by phone call and had no access to a computer or smart phone. Chart reviewed. Evaluated and management per the note above. POS: patient at home; provider in  office.

## 2020-03-09 ENCOUNTER — Inpatient Hospital Stay: Admit: 2020-03-09 | Payer: MEDICARE | Attending: Psychiatry

## 2020-03-09 NOTE — Progress Notes (Signed)
Consent: The patient and/or their healthcare decision maker is aware that they may receive a bill for this audio only encounter, depending on their insurance coverage, and has provided verbal consent to proceed: Yes.  ??  INTERVAL HISTORY??(Audio Only): [name] is a 74 year old widowed white female following up with me??9??weeks??since her last??appointment re: significant symptoms of Major Depression as well as Generalized Anxiety disorder. 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 We then restarted low-dose Zoloft which??was??more helpful coupled with her??move to [place], [place] which has been a real??positive for her. Because of the improvement, she's tapered off the Zoloft and was still feeling good at the last appointment.  ??  She??states??that??she's??been??feeling??"really good" emotionally, and that she's not had any dips in her mood except for the times she talks with her daughter. Her daughter has a way of dismissing her or implying that she doesn't approve of her Mom's decisions or actions, and her daughter tends to misinterpret what people say to her as being critical, even when they're clearly not. Apparently her daughter is like that with everyone else and we discussed ways to try and deal with it, but that it likely won't ever change. Despite that, her N/V functioning has been quite good and she has been enjoying her new relationship and many other things where she's now living. Hasn't had any further  medical "scares" since the last appointment. Still feels that she should stay on the Effexor for now given how relatively stable she's been. Still taking the Ativan infrequently, but it does help when she takes it. She??denies having any SE's with the meds??overall.  ??  CURRENT MEDICATIONS:  1.????Effexor XR 37.5??mg daily  2. ??Lorazepam??0.5??mg prn--taking infrequently  ??  MENTAL STATUS EXAM:????Andrea [name]??was noted to be??very??pleasant and cooperative,??and??exhibited a??full affective range??with no notable depressive symptoms noted presently. She??was??animated??and??euthymic, and not??experiencing any real??anxiety??either. Her??neurovegetative functioning has??still??been??good, and she denied??any??SI or PDW thoughts??whatsoever.??She denied having any manic or hypomanic symptoms??at all??and there was no evidence of any psychosis such as hallucinations or delusional thinking. Her thoughts were fairly organized and coherent, and her judgment and insight appear to be much??better??now??and cognitively her fund of knowledge is above average. ??  ??  DIAGNOSTIC IMPRESSION:  AXIS I:  ??Major Depression, recurrent,??in partial remission??(F33.41)   ???????????????????????? Generalized??Anxiety disorder (F41.1)   AXIS II: ??Deferred.  AXIS III: ??Lung CA (Adeno); hypertension; gastroesophageal reflux disease; rotator cuff repair.  ??  PLAN:  1.????Continue the??Effexor XR at 37.5 mg daily--has 6-7 months of refills??(90 day).??May stop this in the future.  2.????Continue??the Ativan??at??0.5 mg??bid??prn--has??1 refill remaining (#60).  3.????Follow up with me in??8??weeks, sooner if needed.??Will eventually follow up with MD in [place], [place] but wants to follow with me until COVID restrictions are over.  ??  I affirm this is a Patient-Initiated-Episode with a patient who has not had a related appointment within my department in the past 7 days or scheduled within the next 24 hours.  Total Time:??24??minutes  Note: not billable if the call serves to triage the patient into an appointment for  the relevant concern. Patient was evaluated by phone call and had no access  to a computer or smart phone. Chart reviewed. Evaluated and management per the note above. POS: patient at home; provider in office.

## 2020-04-25 ENCOUNTER — Encounter

## 2020-04-25 MED ORDER — LORAZEPAM 0.5 MG TAB
0.5 mg | ORAL_TABLET | Freq: Two times a day (BID) | ORAL | 2 refills | Status: DC | PRN
Start: 2020-04-25 — End: 2020-05-09

## 2020-05-09 ENCOUNTER — Inpatient Hospital Stay: Admit: 2020-05-09 | Payer: MEDICARE | Attending: Psychiatry

## 2020-05-09 DIAGNOSIS — F411 Generalized anxiety disorder: Secondary | ICD-10-CM

## 2020-05-09 MED ORDER — LORAZEPAM 0.5 MG TAB
0.5 mg | ORAL_TABLET | Freq: Two times a day (BID) | ORAL | 2 refills | Status: AC | PRN
Start: 2020-05-09 — End: ?

## 2020-05-09 NOTE — Progress Notes (Signed)
Consent: The patient and/or their healthcare decision maker is aware that they may receive a bill for this audio only encounter, depending on their insurance coverage, and has provided verbal consent to proceed: Yes.  ??  INTERVAL HISTORY??(Audio Only): Mrs.??Aydelotte is a 74 year old widowed white female following up with me??2 months??since her last??appointment re: significant symptoms of Major Depression as well as Generalized Anxiety disorder. 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 We then restarted low-dose Zoloft which??was??more helpful coupled with her??move to [place], [place] which has been a real??positive for her.??Because of the improvement, she's tapered off the Zoloft and was still feeling good at the last 2 appointments.  ??  She??states??that??she's??been??feeling??"super" at this point, and that she's been getting along really well with her daughter now. She'll be visiting her and the grandkids in [place] in the near future and overall she's very pleased with how that relationship is going. Her mood has remained very full and bright, and she's in a very committed relationship that has been "wonderful" for her. She feels she's ready to taper off the Effexor XR and we discussed how to do it and what to expect. She still uses the Lorazepam on rare occasions, mostly for sleep. No med SE's at all. Anxiety is also very well controlled now. Hasn't had any further medical "scares" and she??denies having any SE's with the  meds??overall.  ??  CURRENT MEDICATIONS:  1.????Effexor XR 37.5??mg daily  2. ??Lorazepam??0.5??mg prn--taking infrequently  ??  MENTAL STATUS EXAM:????Donata??was noted to be??very??pleasant and cooperative,??and??exhibited a??full affective range??with??no notable depressive symptoms noted at all. She??was??animated??and??euthymic, and not??experiencing??any real??anxiety??either. Her??neurovegetative functioning has??still??been??good, and she denied??any??SI or PDW thoughts??whatsoever.??She denied having any manic or hypomanic symptoms??at all??and there was no evidence of any psychosis such as hallucinations or delusional thinking. Her thoughts were fairly organized and coherent, and her judgment and insight appear to be much??better??now??and cognitively her fund of knowledge is above average. ??  ??  DIAGNOSTIC IMPRESSION:  AXIS I: ????Major Depression, recurrent,??in partial remission??(F33.41)   ???????????????????????? Generalized??Anxiety disorder--stable (F41.1)   AXIS II: ??Deferred.  AXIS III: ??Lung CA (Adeno); hypertension; gastroesophageal reflux disease; rotator cuff repair.  ??  PLAN:  1.????Taper off the??Effexor XR taking 37.5 mg every other day for 2-3 weeks and then stop it.  2.????Continue??the Ativan??at??0.5 mg??bid??prn--#60 with 2 refills.  3.????No follow up with me at this point but I'll be available if questions arise. Will follow up with MD in [place], [place].  ??  I affirm this is a Patient-Initiated-Episode with a patient who has not had a related appointment within my department in the past 7 days or scheduled within the next 24 hours.  Total Time:??18??minutes  Note: not billable if the call serves to triage the patient into an appointment for the relevant concern. Patient was evaluated by phone call and had no access to a computer or smart phone. Chart reviewed. Evaluated and management per the note above. POS: patient at home; provider in office.

## 2021-05-17 IMAGING — CT CT CHEST WITH CONTRAST
2 of 3 series · 14 of 36 positions shown, 17 images · IV contrast (isovue)
Comparison: PET/CT December 09, 2019

________________________________________________________________________________________________ 
CT CHEST WITH CONTRAST, 05/17/2021 [DATE]: 
CLINICAL INDICATION:  Left lung malignancy 
A search for DICOM formatted images was conducted for prior CT imaging studies 
completed at a non-affiliated media free facility.
TECHNIQUE: The chest was scanned from base of neck through the lung bases with 
100 mL of Isovue 300 injected intravenously on a high resolution low dose CT 
scanner. Routine MPR and MIP 3D renderings were reconstructed on an independent 
workstation with concurrent physician supervision.

[Series 4: chest 2.0 i31s 3 · axial · 0.72mm/px · z∈[-100,+154]mm · 11 of 151 slices shown, 14 images]
[im 12/151  mediastinal]
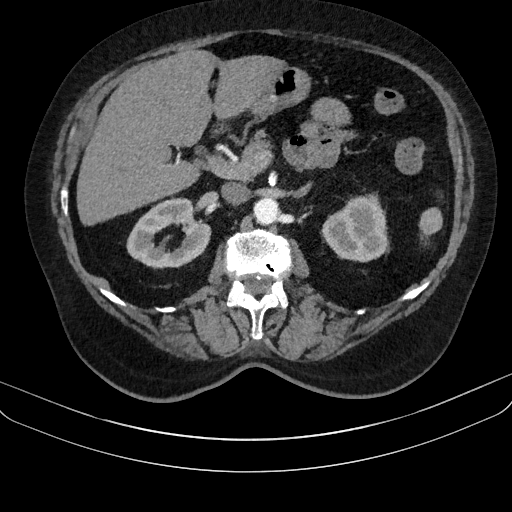
[im 12/151  lung]
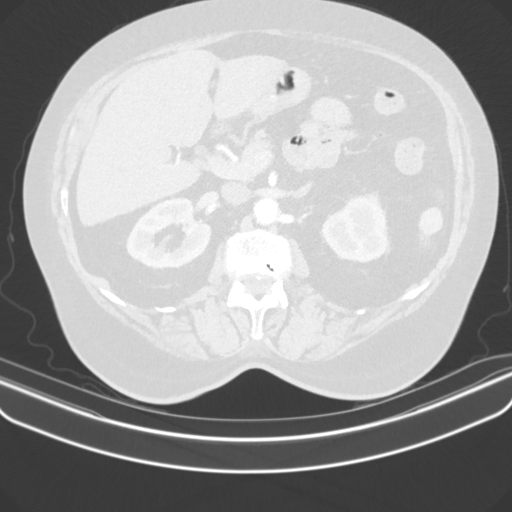
[im 23/151  lung]
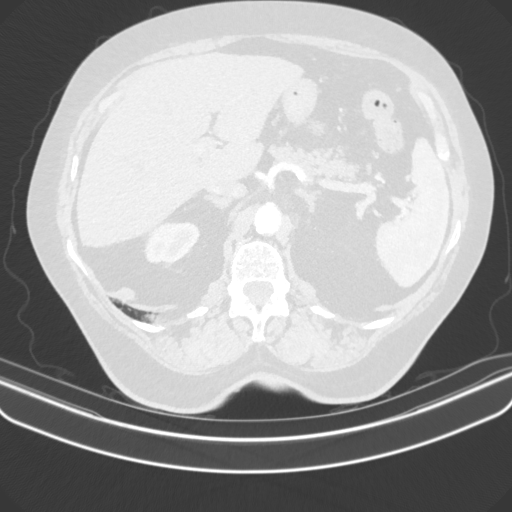
[im 34/151  lung]
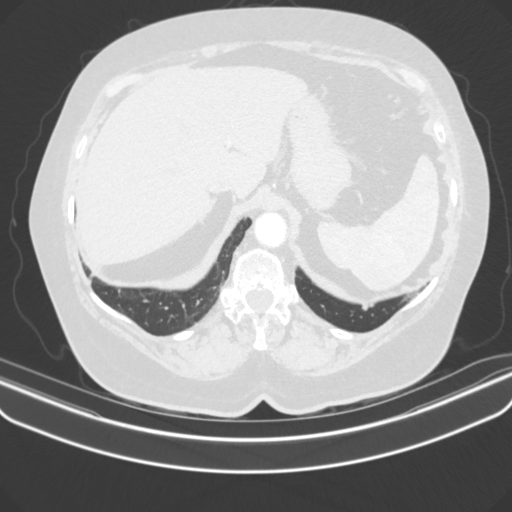
[im 51/151  lung]
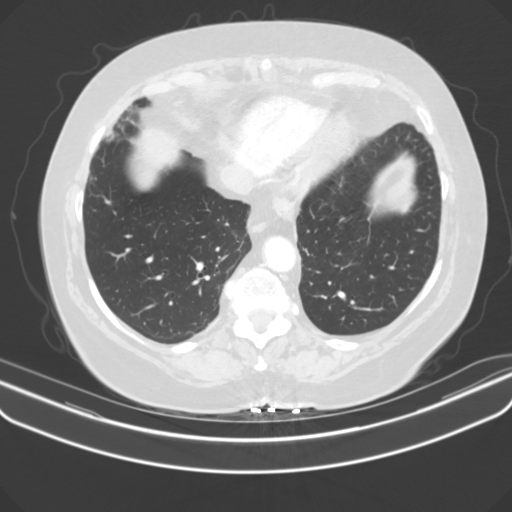
[im 62/151  mediastinal]
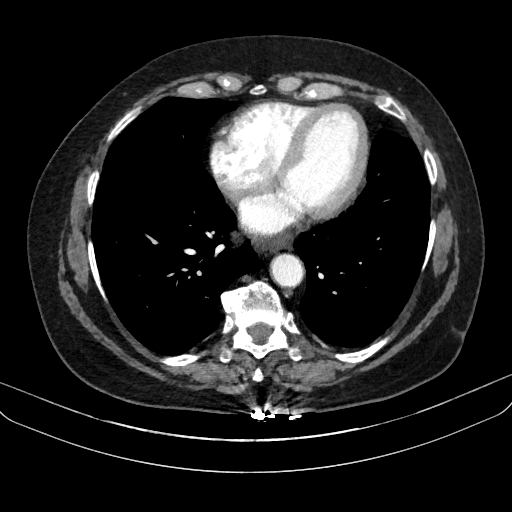
[im 62/151  lung]
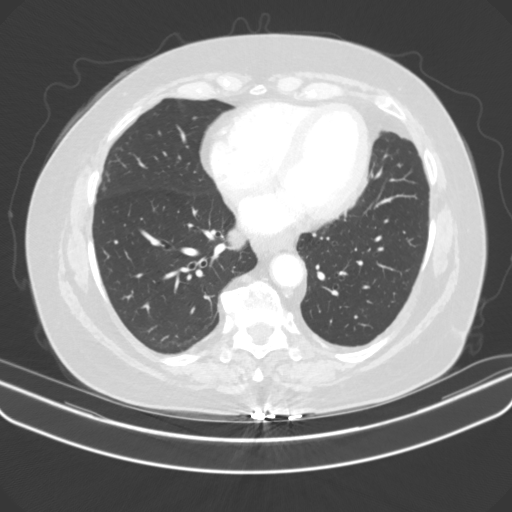
[im 78/151  lung]
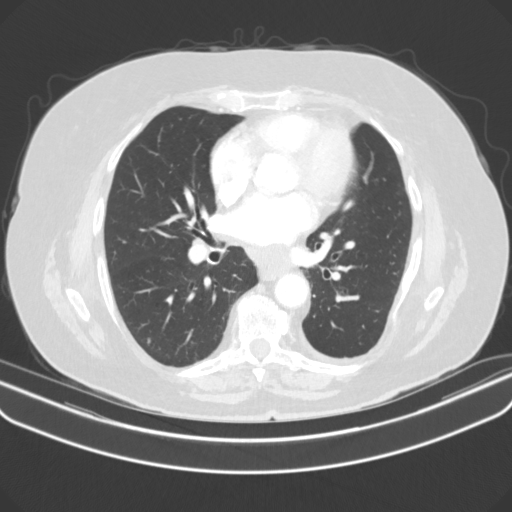
[im 89/151  lung]
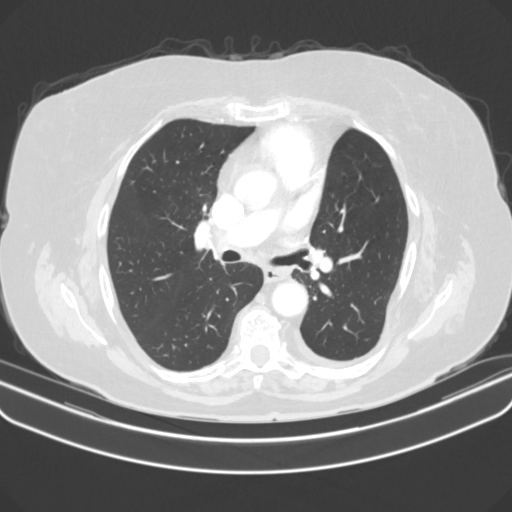
[im 101/151  lung]
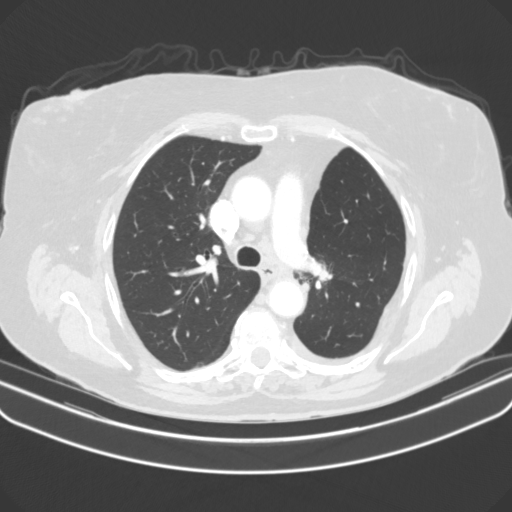
[im 117/151  mediastinal]
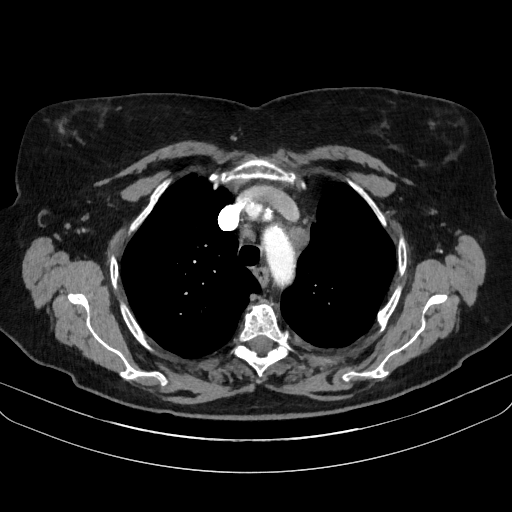
[im 117/151  lung]
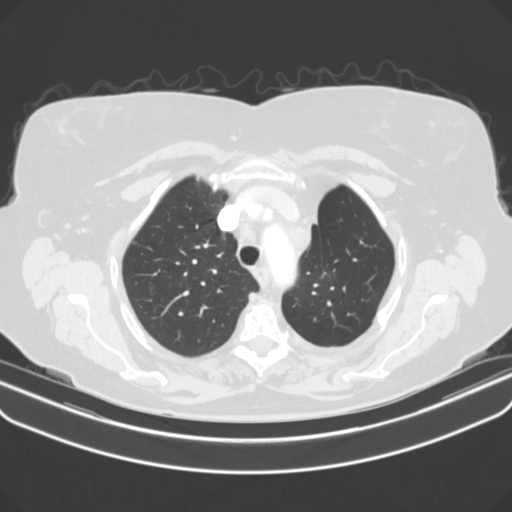
[im 128/151  lung]
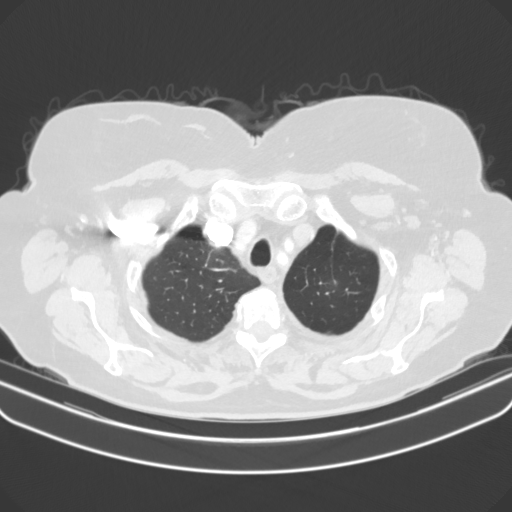
[im 139/151  lung]
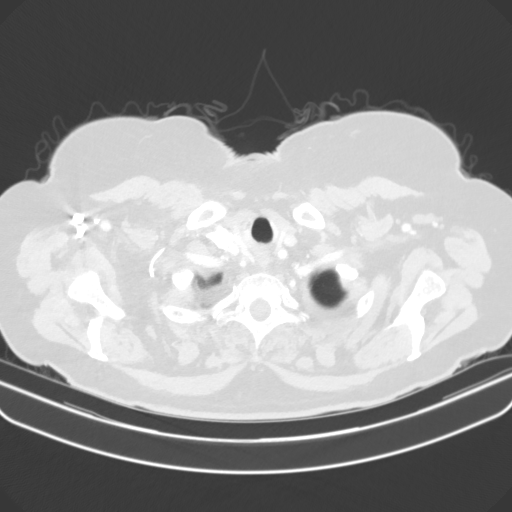

[Series 7: coronal · coronal · 0.58mm/px · 3 of 134 slices shown]
[im 27/134  lung]
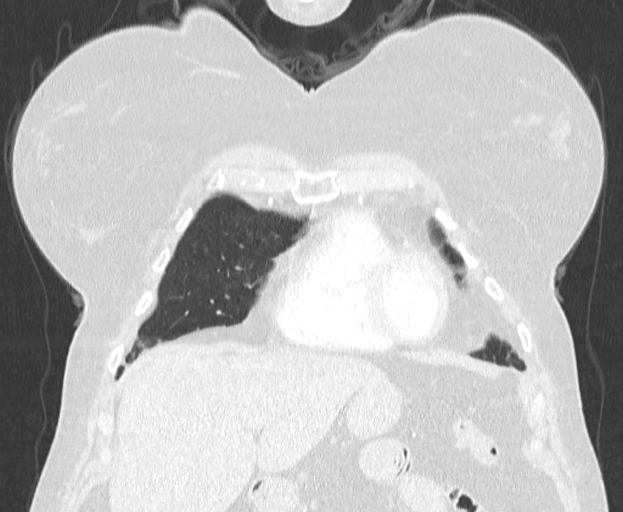
[im 54/134  lung]
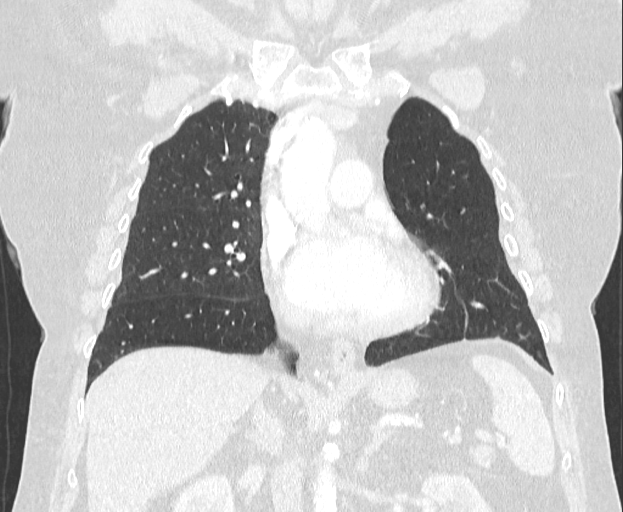
[im 80/134  lung]
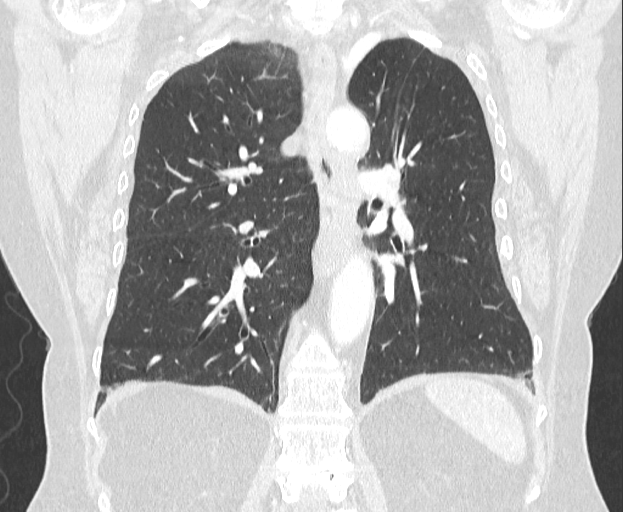

[14 of 36 positions shown; findings below may reference images not displayed]

FINDINGS: There is a 7 mm nodule in the left lower lobe, image 76, which 
measured 5 mm on the prior PET/CT image 53. There is a 3.5 mm nodule at the left 
lung base, image 108, not well depicted on the prior study. This is adjacent to 
the diaphragm. There is a 4 mm nodule on image 107 at the left base adjacent to 
the diaphragm which appears stable. 9 mm nodule at the left base, image 100, not 
well depicted on the prior study. Several small pleural-based densities at the 
periphery of the left lower lobe, image 95, 2-3 mm. In the left upper lobe, 
image 33 there is a 6 mm nodule which was not present previously. At the 
periphery of the right upper lobe, image 43 there is a 3.5 mm nodule. There is 
no effusion.  
There is a 14 x 10 mm lymph node in the superior mediastinum prevascular 
compartment, image 35 which was not present previously. There is a 14 x 9 mm 
pretracheal node, previously 13 x 7 mm, not well depicted due to artifact. There 
is a 16 x 11 mm aorticopulmonary window node, image 49, previously 14 x 9 mm 
appearing mildly increased. 
There is no pulmonary embolism. The heart is not enlarged. There is abnormal 
soft tissue anterior to the esophagus posterior to the left atrium, axial image 
73, not depicted previously. This is suspicious for lymph node, 2.3 x 2.0 cm. 
No adrenal nodule. Small hepatic cysts. Spleen is not enlarged. There is mild 
aortic plaque. There are degenerative changes.
IMPRESSION: There appears to be new mediastinal adenopathy, and there are also new pulmonary 
nodules, largest 9 and 7 mm in the left lower lobe. New left upper lobe nodule, 
6 mm. PET CT recommended. 
RADIATION DOSE REDUCTION: All CT scans are performed using radiation dose 
reduction techniques, when applicable.  Technical factors are evaluated and 
adjusted to ensure appropriate moderation of exposure.  Automated dose 
management technology is applied to adjust the radiation doses to minimize 
exposure while achieving diagnostic quality images.

## 2021-08-03 IMAGING — MR MRI BRAIN W/WO CONTRAST
5 of 12 series · 20 of 48 positions shown · IV contrast (gadavist)
Comparison: No prior cranial examination

________________________________________________________________________________________________ 
MRI BRAIN W/WO CONTRAST,08/03/2021 [DATE]: 
CLINICAL INDICATION: Small cell lung cancer
TECHNIQUE: Axial T1, Axial T2, Axial FLAIR, Diffusion weighted images, Sagittal 
T1, Enhanced Axial T1, and Enhanced coronal fat-suppressed T1 were obtained. 7 
ccs of Gadavist was injected intravenously by hand. GFR 93

[Series 401: FLAIR · axial · 5.0mm · 0.60mm/px · z∈[-67,+88]mm · 3 of 27 slices shown]
[im 1/27]
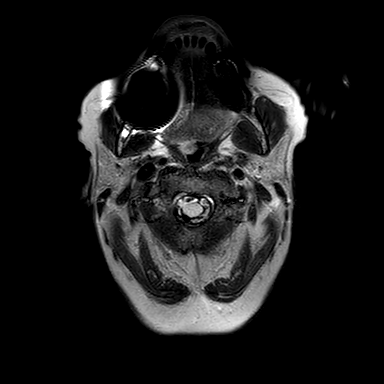
[im 14/27]
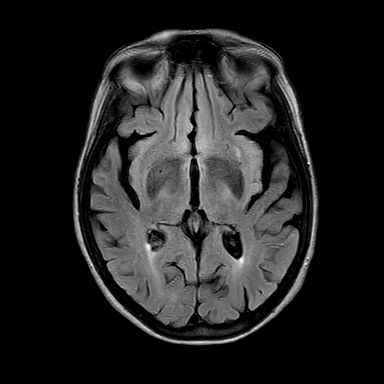
[im 27/27]
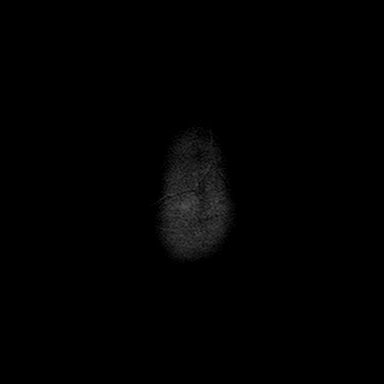

[Series 601: SWI · axial · 3.0mm · 0.53mm/px · z∈[-64,+40]mm · 6 of 100 slices shown]
[im 1/100]
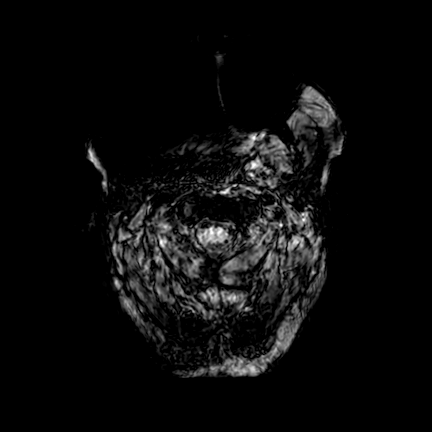
[im 20/100]
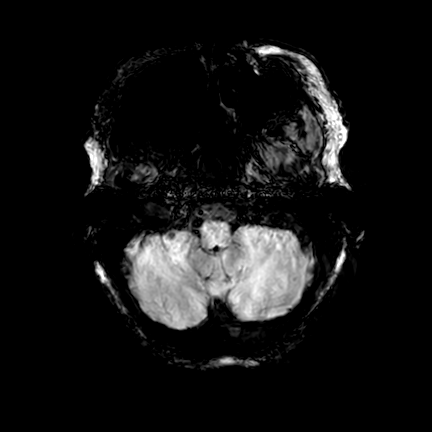
[im 30/100]
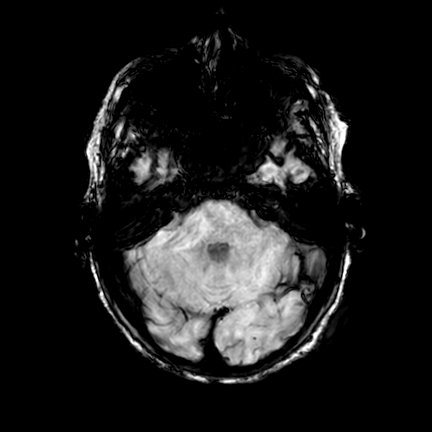
[im 40/100]
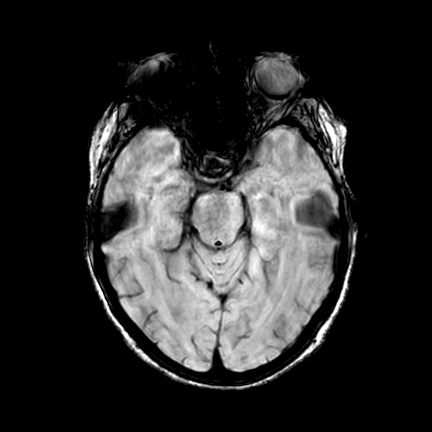
[im 60/100]
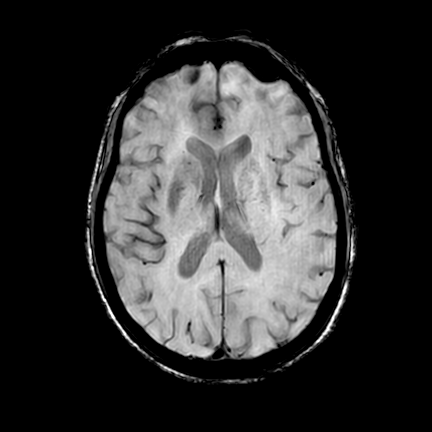
[im 70/100]
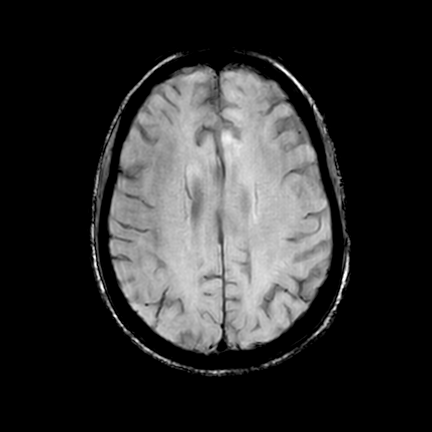

[Series 701: T2 · axial · 5.0mm · 0.41mm/px · z∈[-67,+88]mm · 3 of 27 slices shown]
[im 1/27]
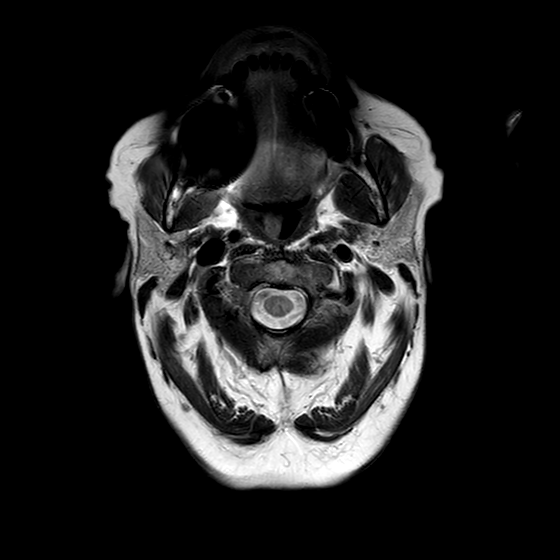
[im 14/27]
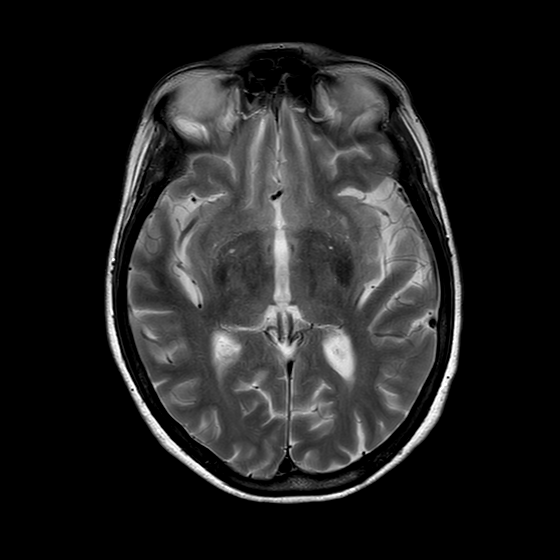
[im 27/27]
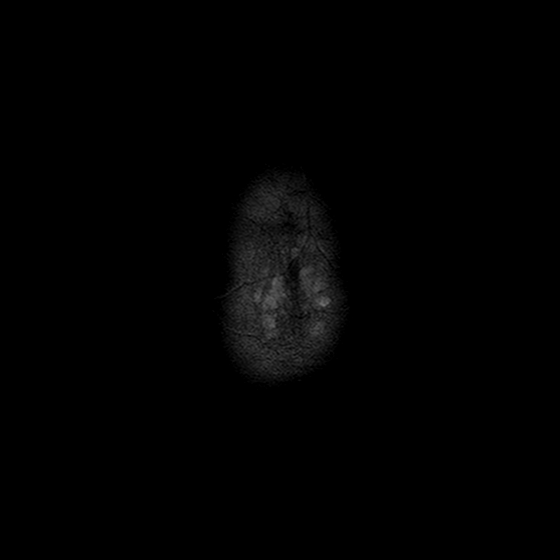

[Series 901: T1 post-contrast · coronal · 4.0mm · 0.38mm/px · 4 of 36 slices shown (1 of 2)]
[im 1/36]
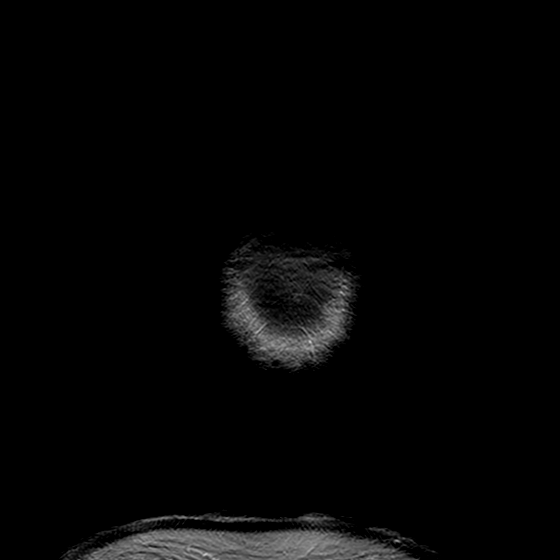
[im 12/36]
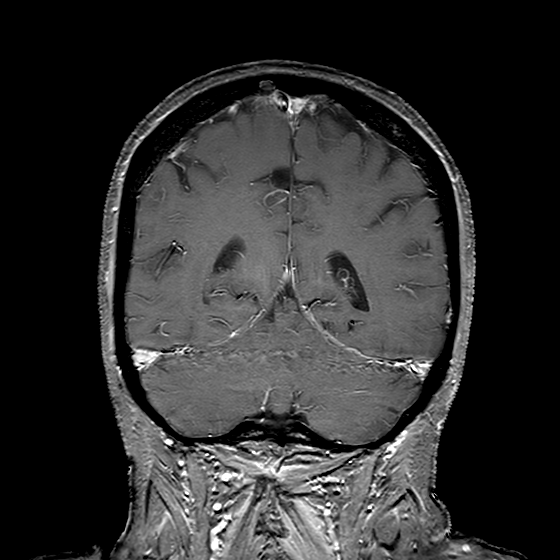
[im 24/36]
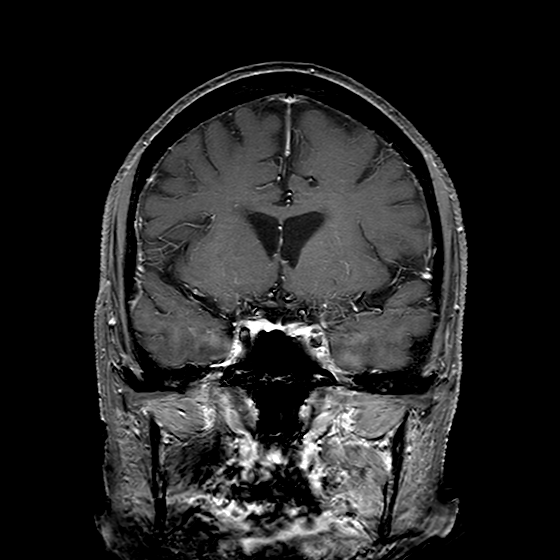
[im 36/36]
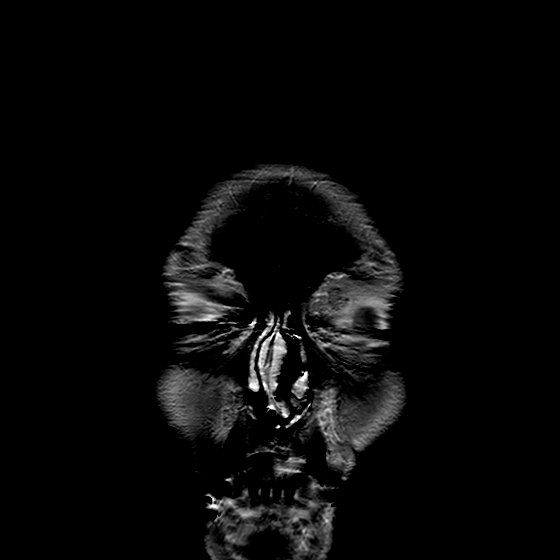

[Series 1001: T1 post-contrast · sagittal · 4.0mm · 0.34mm/px · 4 of 34 slices shown (2 of 2)]
[im 1/34]
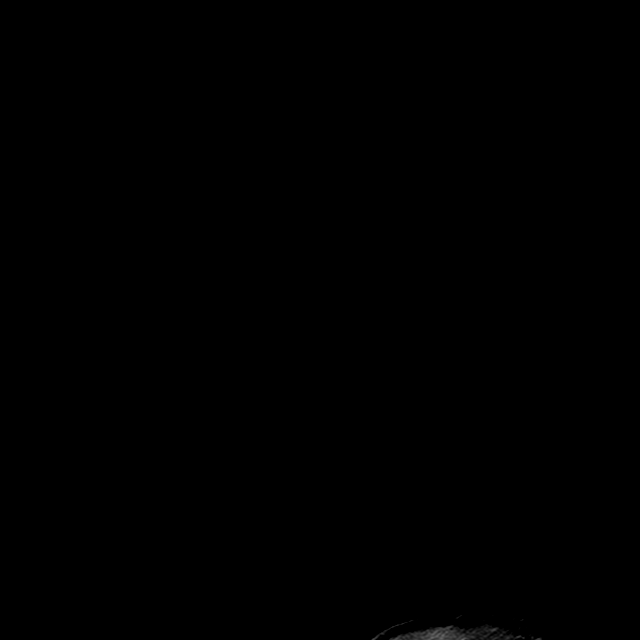
[im 12/34]
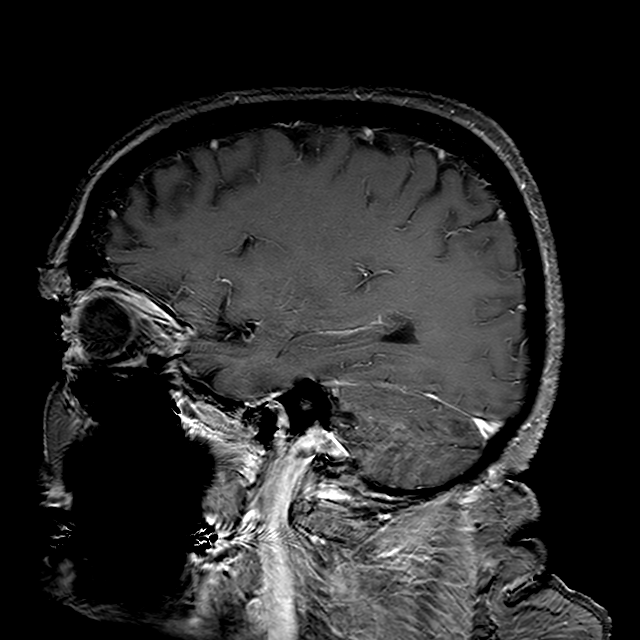
[im 23/34]
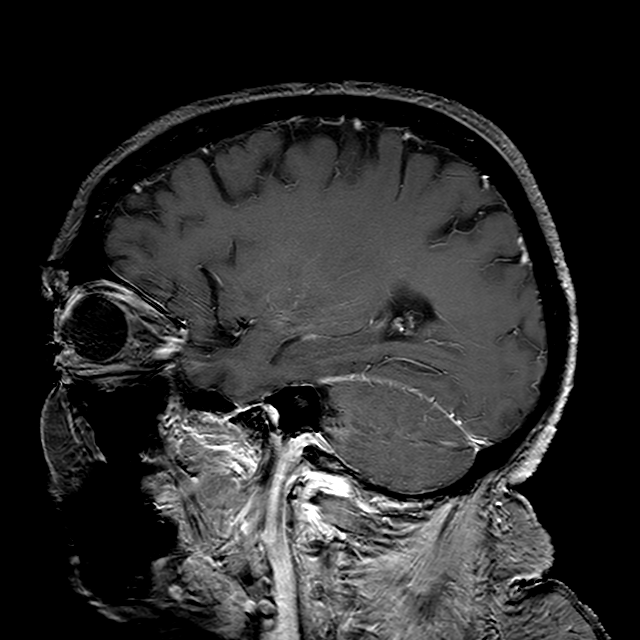
[im 34/34]
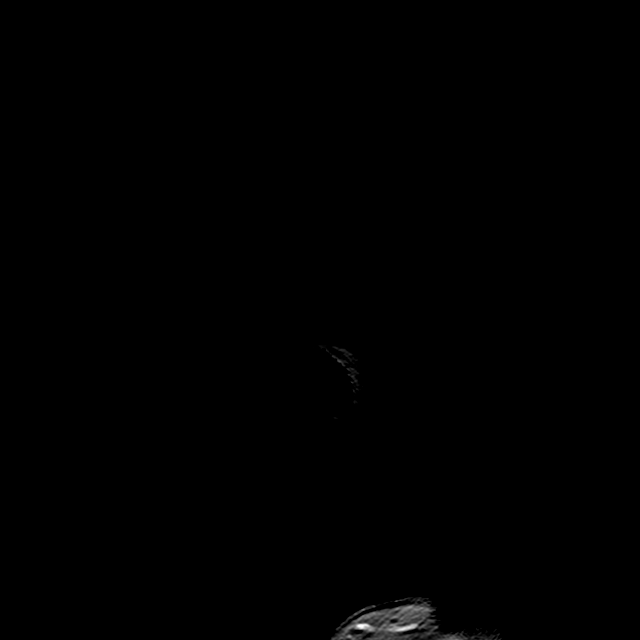

[20 of 48 positions shown; findings below may reference images not displayed]

FINDINGS: There is no pathologic enhancement or mass. There is no significant 
atrophy for age. There are mild chronic appearing cerebral white matter 
microangiopathic changes. There is no hydrocephalus. No discrete brainstem or 
cerebellar findings. Diffusion images are negative. Susceptibility sequences 
show no parenchymal hemosiderin. Major arterial segments are open. Dural sinuses 
are open. No discrete brainstem or cerebellar findings. There has been bilateral 
ocular lens implantation. Tympanic cavities, mastoid air cells and paranasal 
sinuses are clear. The craniocervical junction is open. No sellar mass. No 
evidence for calvarial or skull base metastasis.
IMPRESSION: No evidence for cranial metastasis.

## 2021-10-15 IMAGING — CT CT CHEST/ABDOMEN AND PELVIS WITH CONTRAST
1 of 3 series · 11 of 32 positions shown, 16 images · IV contrast (agent unspecified)
Comparison: PET CT May 28, 2021, CT chest May 17, 2021 and PET CT March

________________________________________________________________________________________________ 
CT CHEST/ABDOMEN AND PELVIS WITH CONTRAST, 10/15/2021 [DATE]: 
(Films were taken at [facility].)  
CLINICAL INDICATION:  75-year-old female with lung cancer. Metastatic. Assess 
treatment response. 
A search for DICOM formatted images was conducted for prior CT imaging studies 
completed at a non-affiliated media free facility.
TECHNIQUE: The region of interest was scanned with 100 mL IV contrast on a high 
resolution CT scanner.  Routine MPR reconstructions were performed.

[Series 2: cap w · axial · 0.94mm/px · z∈[-718,-166]mm · 11 of 210 slices shown, 16 images]
[im 13/210  soft-tissue]
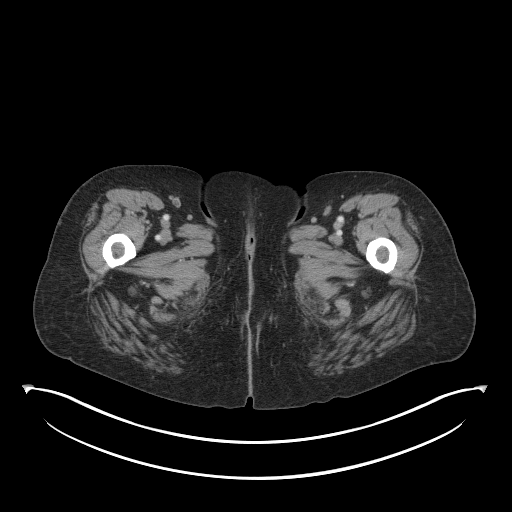
[im 13/210  bone]
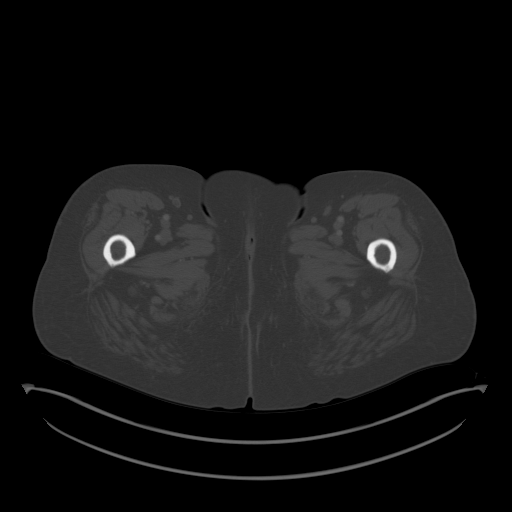
[im 37/210  soft-tissue]
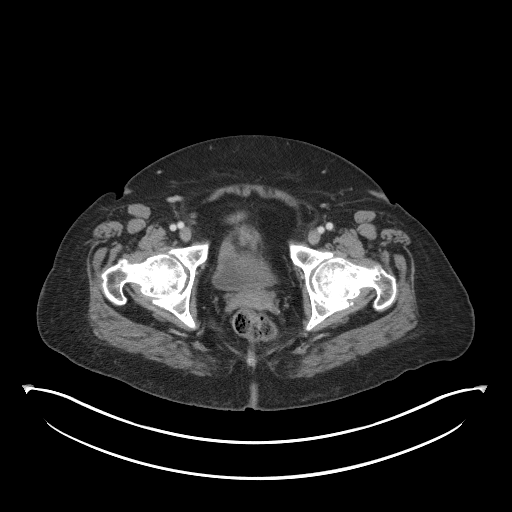
[im 62/210  soft-tissue]
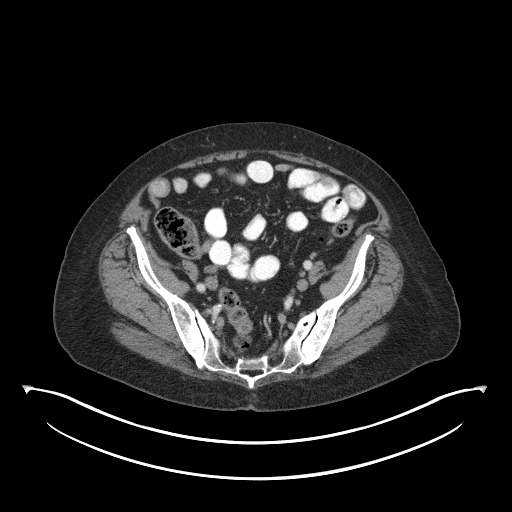
[im 74/210  soft-tissue]
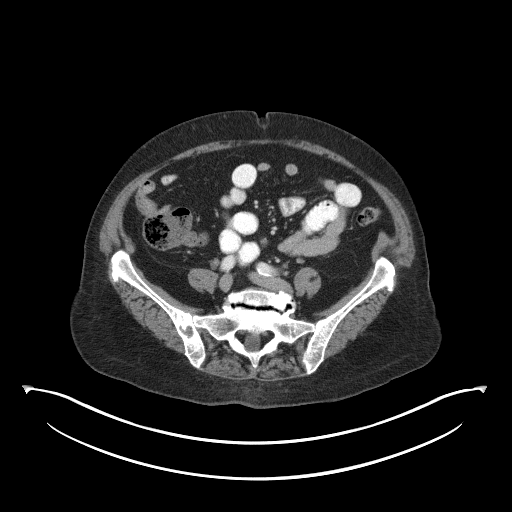
[im 99/210  soft-tissue]
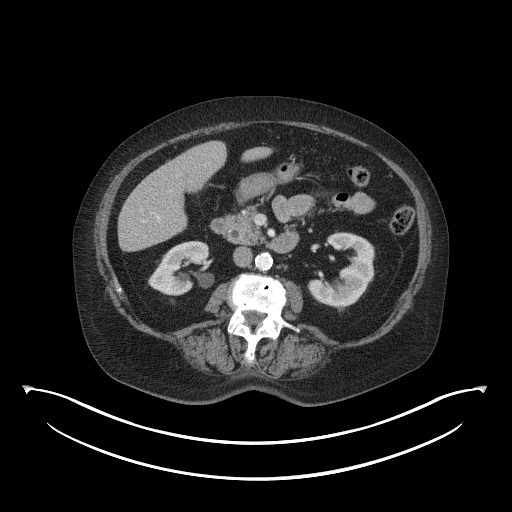
[im 111/210  soft-tissue]
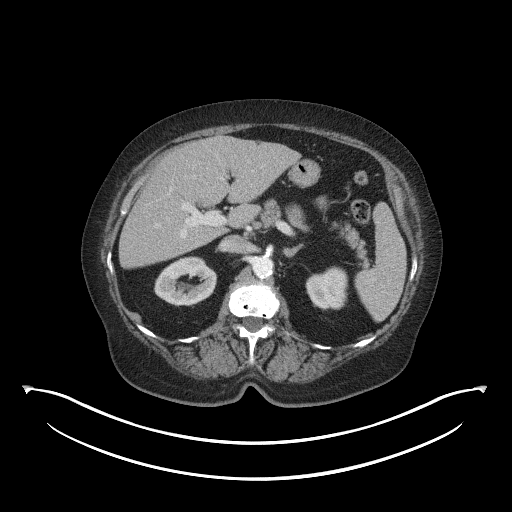
[im 136/210  soft-tissue]
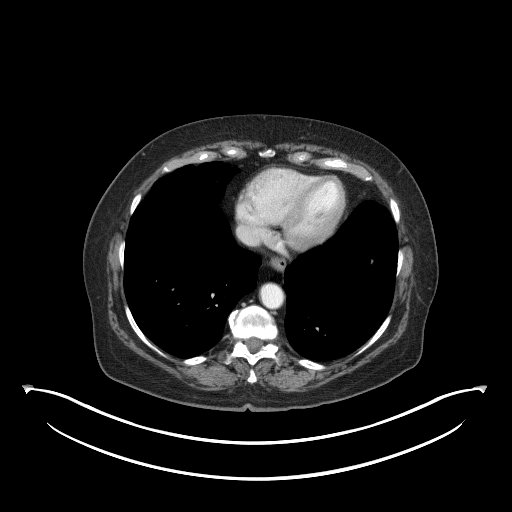
[im 160/210  soft-tissue]
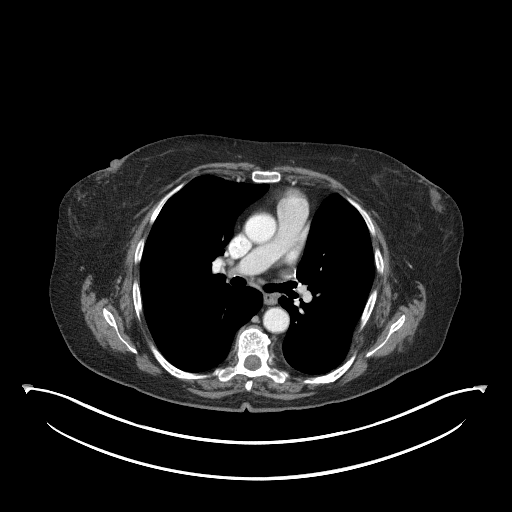
[im 160/210  lung]
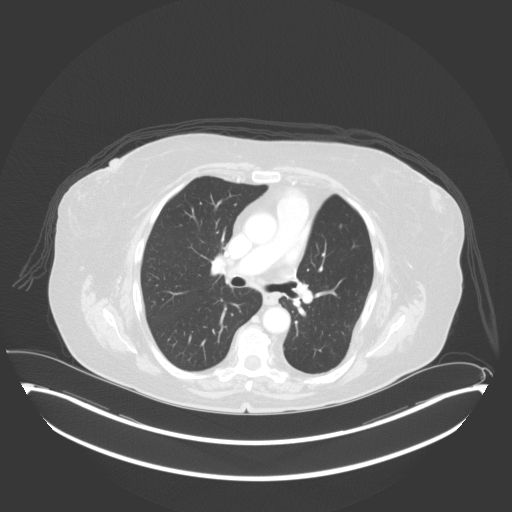
[im 173/210  soft-tissue]
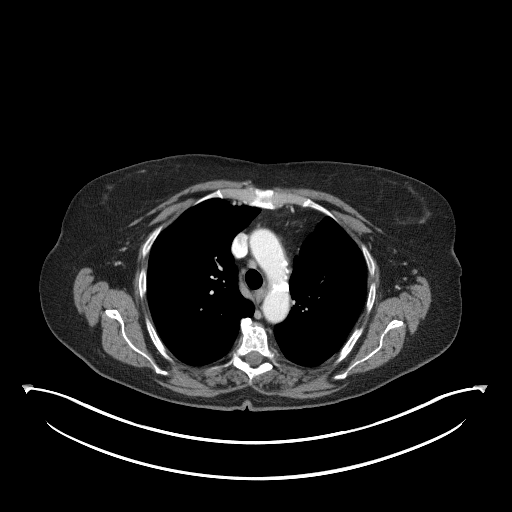
[im 173/210  lung]
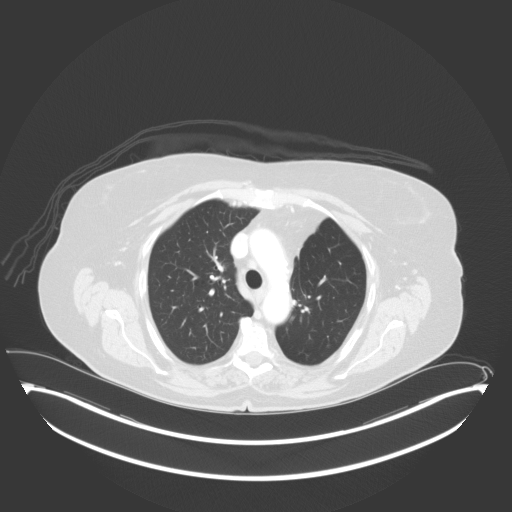
[im 173/210  bone]
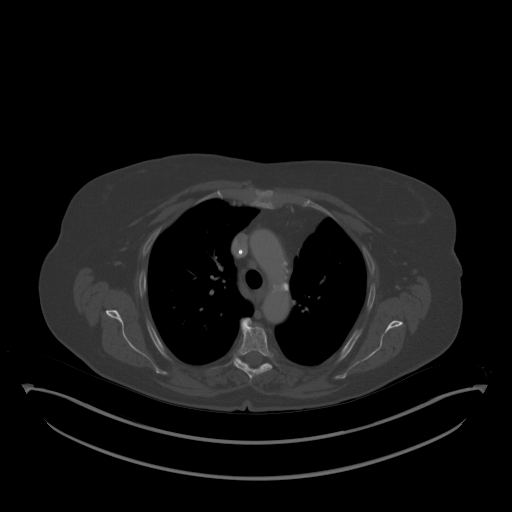
[im 185/210  lung]
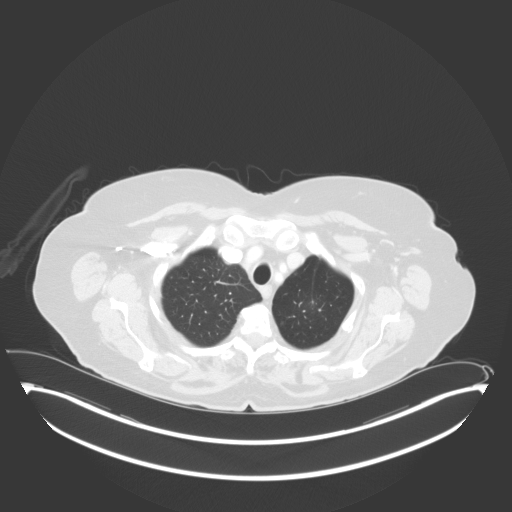
[im 197/210  soft-tissue]
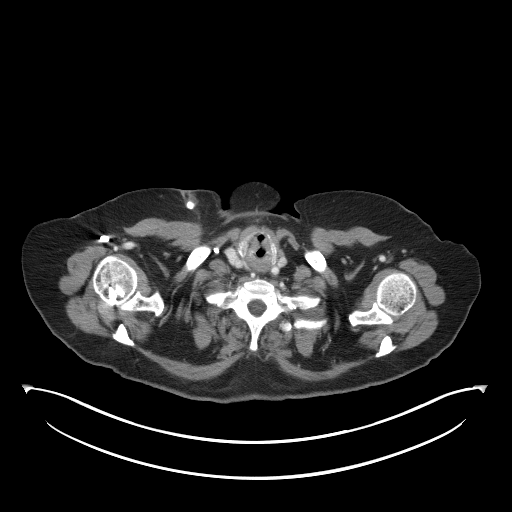
[im 197/210  lung]
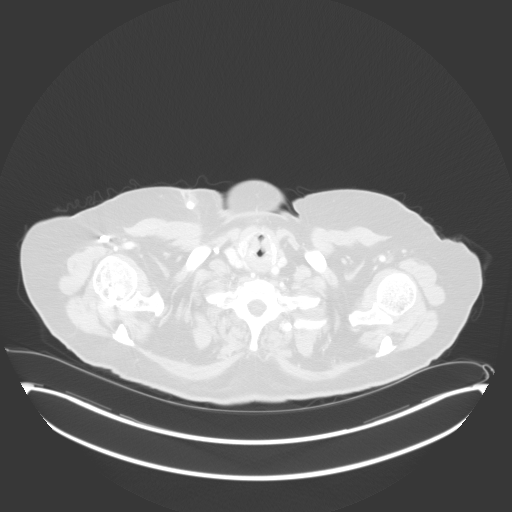

[11 of 32 positions shown; findings below may reference images not displayed]

FINDINGS: LUNGS AND PLEURA:  Previous left upper lobectomy change. Large airways otherwise 
patent. 3 mm subpleural nodule right upper lobe image 37 series 3 is stable to 
May 17, 2021. There is stable subpleural scarring in the anterolateral right 
middle lobe. Extreme pulmonary nodule in the subpleural location right lower 
lobe image 56 series 3 is marginally more conspicuous than on May 17, 2021. 
There is a stable-appearing 5 mm nodule adjacent to postsurgical changes within 
the remaining left lung apex. Stable subpleural nodular density left lung base 
anteriorly, image 74 series 3 measuring 5 mm. Stable 4.5 mm nodule immediately 
above the left hemidiaphragm image 80 series 3. There is a triangular-shaped 
nodule at the left lung base image 75 series 3 measuring 7 mm, stable. No 
pleural effusion. 
MEDIASTINUM:  Surgical changes adjacent to the left hilum. There are some small, 
nonpathologically enlarged AP window lymph nodes noted adjacent to the aortic 
arch, measuring no greater than 3 mm, image 40 series 2 which are smaller. An 
enlarged left paratracheal lymph node image 42 series 2 measures 11 x 8 mm, 
smaller than on May 17, 2021. There is an enlarged pretracheal lymph node 
image 34 series 2 measuring 12 x 7 mm, smaller than on May 17, 2021. The 
previously noted dominant subcarinal lymph node mass is much smaller and cannot 
be measured due to its amorphous appearance, but is best identified on image 55 
of series 2. No new or progressive adenopathy. Normal heart size. No pericardial 
effusion. No hilar adenopathy.  
CHEST WALL/AXILLA: Right chest port. Catheter lies in the distal superior vena 
cava. No axillary adenopathy. 
HEPATOBILIARY: Stable 6 mm low-attenuation focus at the right hepatic dome may 
simply reflect a stable hepatic cyst, with another lesion at the left hepatic 
dome measuring about 4 mm image 87 series 2, also stable. There appears to be 
another hepatic cyst adjacent to the portal vein bifurcation, image 99 series 2. 
Gallbladder is not visualized. 
SPLEEN: Normal in size. 
PANCREAS: No ductal dilatation or mass.   
ADRENALS: No mass. 
GENITOURINARY: No evidence for enhancing mass, stones or hydronephrosis. No 
bladder mass. Uterus present. Dystrophic calcification at the right uterine 
fundus may reflect a small fibroid. 
LYMPH NODES: No adenopathy. 
STOMACH, SMALL BOWEL AND COLON: Colonic diverticulosis. 
VASCULAR STRUCTURES: No aneurysm. Moderate atherosclerotic changes.  
MUSCULOSKELETAL: Degenerative changes within the spine. Right L5 pars defect. 
Aggressive appearing lytic lesion in the right iliac crest measures 2.4 x
cm. There is evidence of cortical thinning and a degree of cortical breakthrough 
along the lateral aspect but without definite soft tissue component. On the 
previous study dated May 28, 2021, this lesion measured 1.8 x 1.1 cm. There 
are numerous old appearing left-sided rib fractures. Fat-containing left 
inguinal hernia. Small fat-containing periumbilical hernia. 
ADDITIONAL FINDINGS: None.
IMPRESSION: CT chest: Previous left upper lobectomy change. Significant interval decrease in 
previously noted FDG avid mediastinal lymph nodes. No new or progressive 
adenopathy. Stable subcentimeter pulmonary nodules detailed above. 
CT abdomen and pelvis: No evidence of pathologic adenopathy in the abdomen or 
pelvis. 
Aggressive appearing lytic lesion right iliac crest is marginally larger than on 
May 28, 2021 and is consistent with a metastatic deposit. 
RADIATION DOSE REDUCTION: All CT scans are performed using radiation dose 
reduction techniques, when applicable.  Technical factors are evaluated and 
adjusted to ensure appropriate moderation of exposure.  Automated dose 
management technology is applied to adjust the radiation doses to minimize 
exposure while achieving diagnostic quality images.

## 2021-11-01 IMAGING — CT CT ABDOMEN AND PELVIS WITH CONTRAST  (FCS)
2 of 3 series · 12 of 36 positions shown, 17 images · IV contrast (isovue)
Comparison: Comparison was made to the prior exam(s) within the last 12 months 
10/15/2021 CT abdomen/pelvis and 05/28/2021 PET/CT.

________________________________________________________________________________________________ 
CT ABDOMEN AND PELVIS WITH CONTRAST  (FCS), 11/01/2021 [DATE]: 
(Films were taken at [facility].)  
CLINICAL INDICATION: History of metastatic lung cancer.
TECHNIQUE: The abdomen and pelvis was scanned from lung bases through the pubic 
rami with 100 mL of Isovue 300 on a high-resolution CT scanner using dose 
reduction techniques.  Routine MPR reconstructions were performed.

[Series 2: abd [id number] · axial · 0.88mm/px · z∈[-723,-327]mm · 11 of 152 slices shown, 15 images]
[im 13/152  soft-tissue]
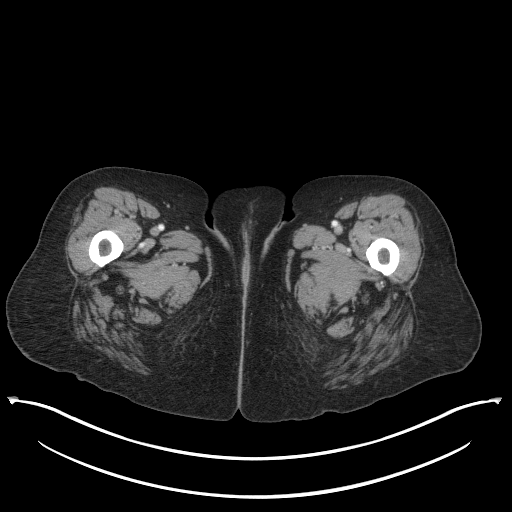
[im 13/152  bone]
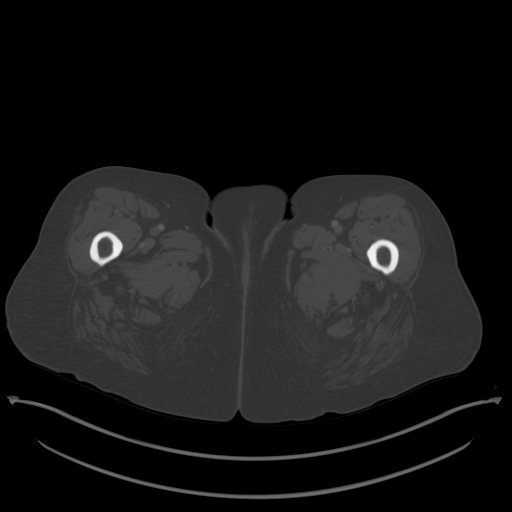
[im 26/152  soft-tissue]
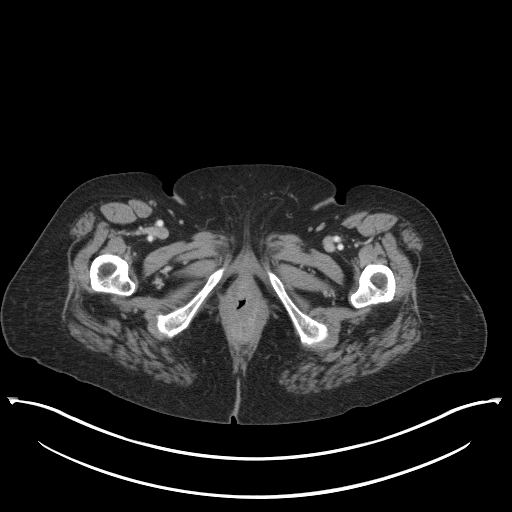
[im 45/152  soft-tissue]
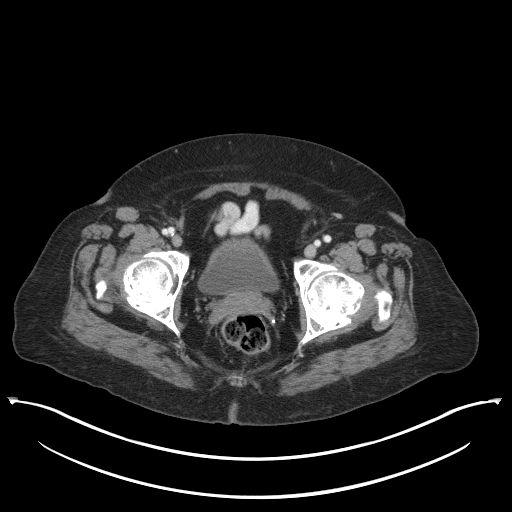
[im 57/152  soft-tissue]
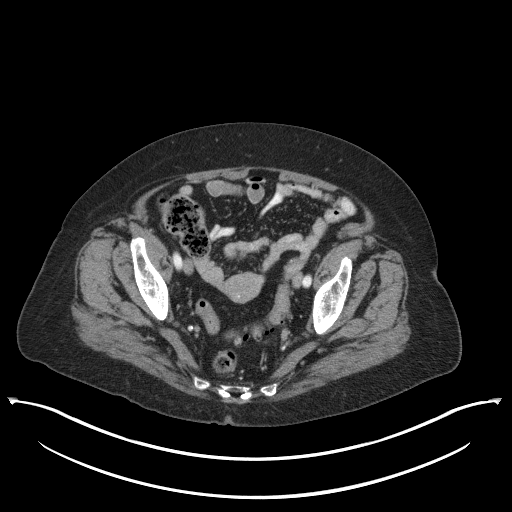
[im 76/152  soft-tissue]
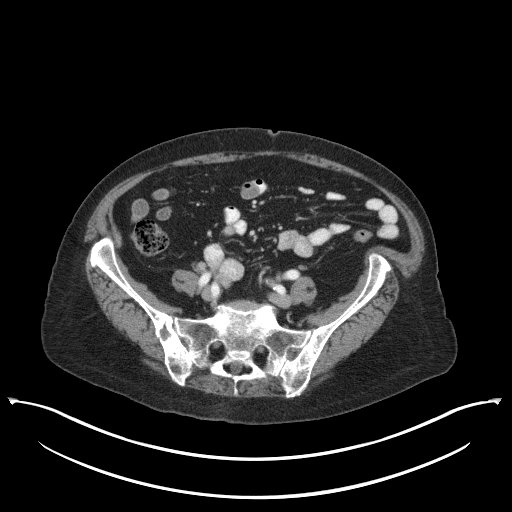
[im 95/152  soft-tissue]
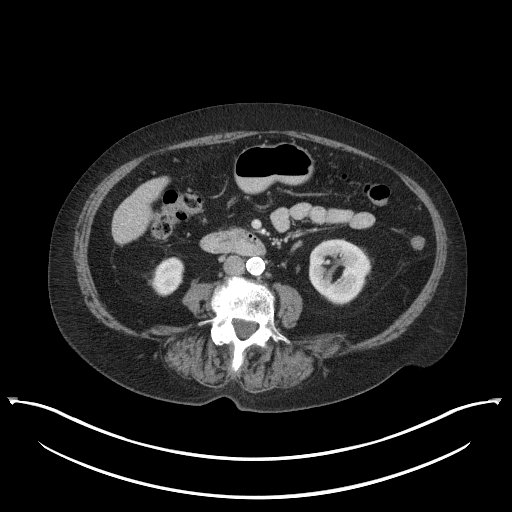
[im 107/152  soft-tissue]
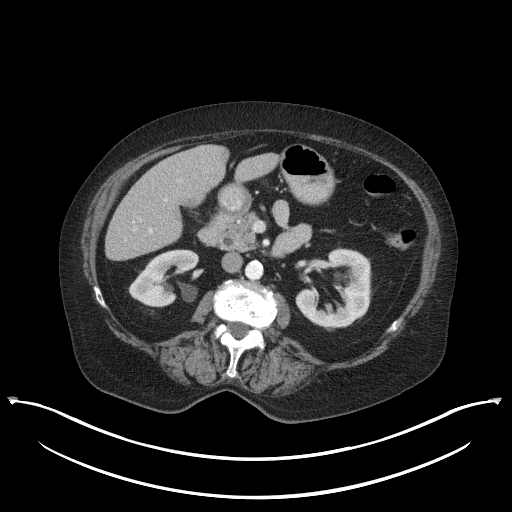
[im 126/152  soft-tissue]
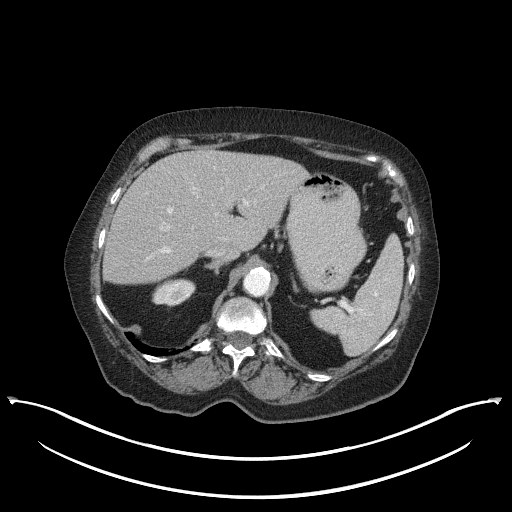
[im 126/152  lung]
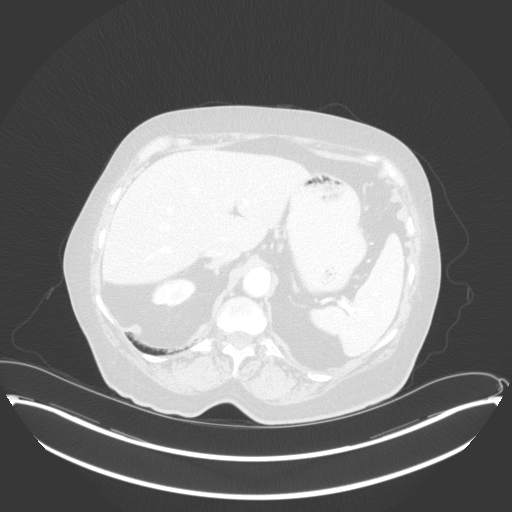
[im 133/152  lung]
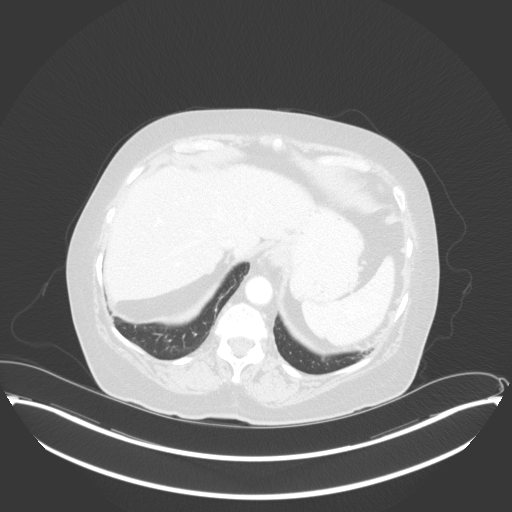
[im 139/152  soft-tissue]
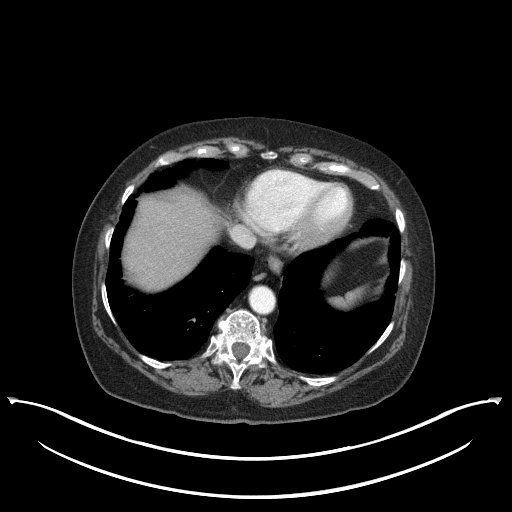
[im 139/152  lung]
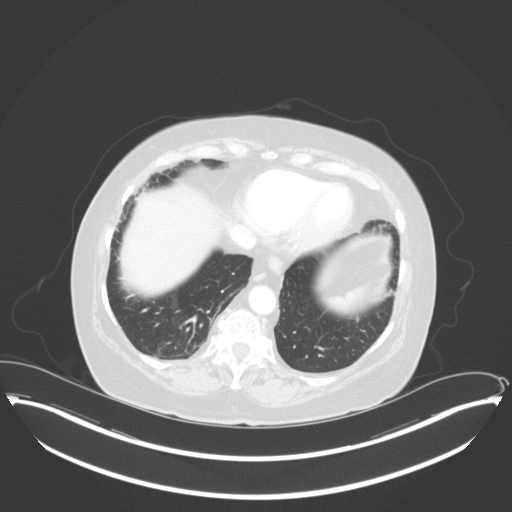
[im 139/152  bone]
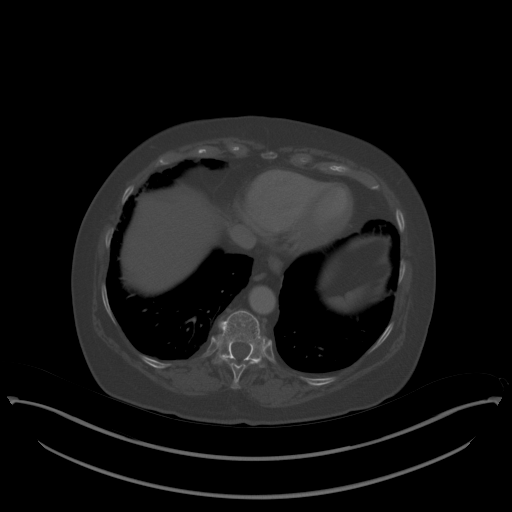
[im 145/152  lung]
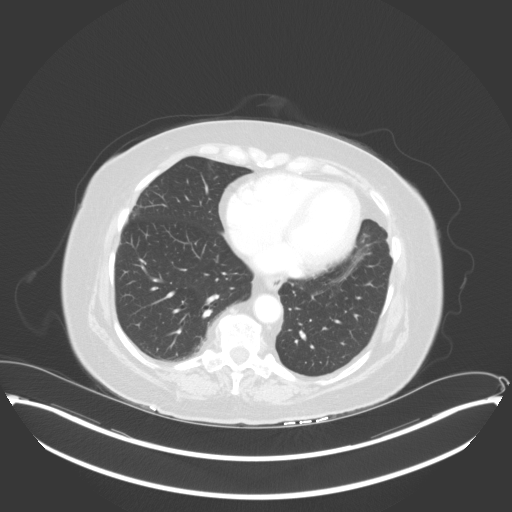

[Series 5: ap w sag · sagittal · 0.58mm/px · 1 of 72 slices shown, 2 images]
[im 24/72  soft-tissue]
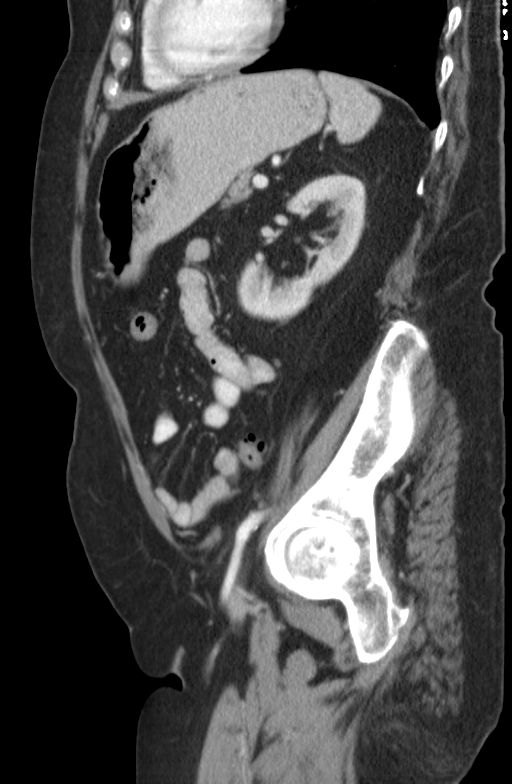
[im 24/72  bone]
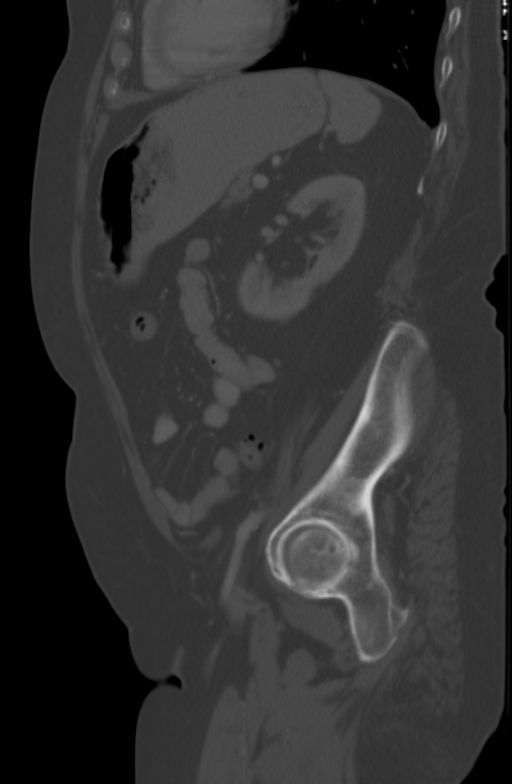

[12 of 36 positions shown; findings below may reference images not displayed]

FINDINGS: LUNG BASES: Several stable left basilar peripheral nodules measure up to 4.7 x 
3.8 mm (sequence 2 image 10). No pleural effusions. 
HEPATOBILIARY: No mass or biliary dilatation. Small hepatic cysts. No 
gallstones. 
SPLEEN: Normal in size. 
PANCREAS: No ductal dilatation or mass.   
ADRENALS: No mass. 
GENITOURINARY: No enhancing mass or hydronephrosis.  Two 1 mm adjacent 
nonobstructing left mid renal calculi. Bladder is unremarkable. Calcified 
uterine fibroids. 
LYMPH NODES: No adenopathy. 
STOMACH, SMALL BOWEL AND COLON: Mild fullness of the distal rectum corresponds 
to increased metabolic uptake on PET/CT. Colonic diverticulosis. No bowel wall 
thickening or obstruction. 
VASCULAR STRUCTURES: No aneurysm. Atherosclerosis. 
MUSCULOSKELETAL: 2.3 x 1.2 x 2.6 cm lytic, mildly expansile lesion in the right 
posteromedial ilium with expansile remodeling of the posterior cortex and small 
cortical breaches and surrounding sclerosis: Finding has not significantly 
changed and is consistent with metastasis. Right L5 pars interarticularis defect 
and grade 1 anterolisthesis at L5-S1. Scattered degenerative changes. Small 
fat-containing left inguinal hernia.
IMPRESSION: 1.  Stable 2.3 x 1.2 x 2.6 cm right posteromedial iliac metastasis. 
2.  Several stable left basilar peripheral nodules (up to 4.7 x 3.8 mm). 
3.  Mild fullness of the distal rectum corresponds to increased metabolic uptake 
on PET/CT and is indeterminate.  
4.  Stable multiple additional findings. 
RADIATION DOSE REDUCTION: All CT scans are performed using radiation dose 
reduction techniques, when applicable.  Technical factors are evaluated and 
adjusted to ensure appropriate moderation of exposure.  Automated dose 
management technology is applied to adjust the radiation doses to minimize 
exposure while achieving diagnostic quality images.

## 2021-11-12 IMAGING — NM BONE SCAN WHOLE BODY
2 series · 10 of 10 positions shown · non-contrast
Comparison: CT 11/01/2021, 10/15/2021, CT PET 05/28/2021, CT chest 05/17/2021, CT 
PET 12/09/2019

________________________________________________________________________________________________ 
BONE SCAN WHOLE BODY, 11/12/2021 [DATE]: 
CLINICAL INDICATION: Metastatic non-small cell lung cancer diagnosed November 2018, 
assess treatment response to chemotherapy, previous LEFT upper lobectomy
TECHNIQUE: Following the injection of  26.1 mCi MARSELA Tc 99m MDP scanning of the 
entire skeleton was performed.

[Series 1000: wholebody · 2.40mm/px · 2 of 2 frames shown]
[frame 1/2]
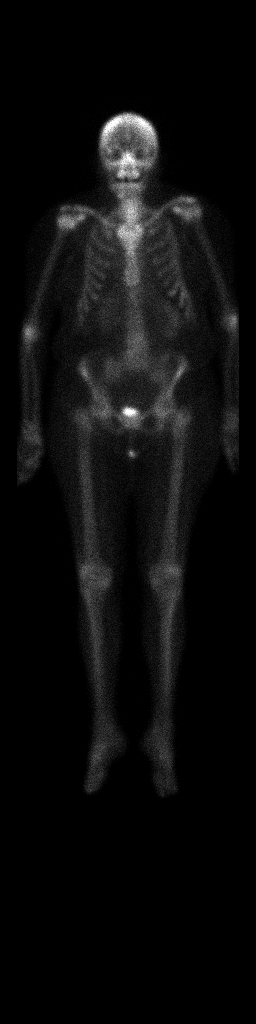
[frame 2/2]
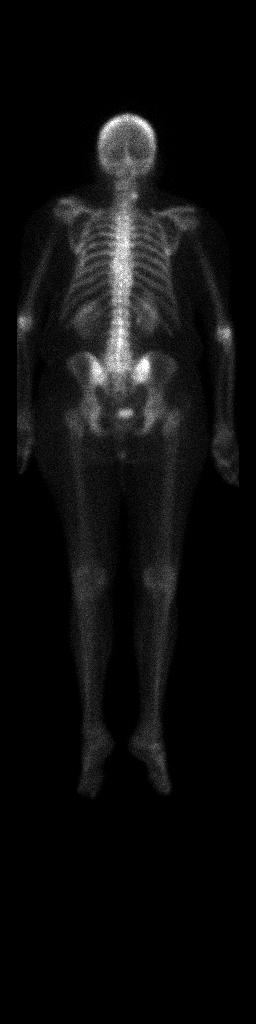

[Series 1000: bone statics · 4.80mm/px · 4 acquisitions, 8 frames shown]
[im 1/4]
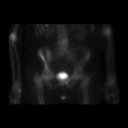
[im 1/4]
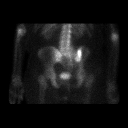
[im 2/4]
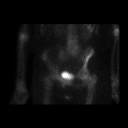
[im 2/4]
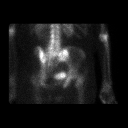
[im 3/4]
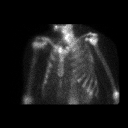
[im 3/4]
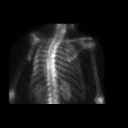
[im 4/4]
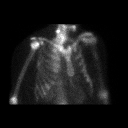
[im 4/4]
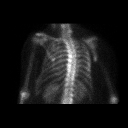

[10 of 10 positions shown; findings below may reference images not displayed]

FINDINGS: Physiologic distribution of the radiopharmaceutical. In the region 
of the RIGHT posterior medial iliac bone adjacent to the SI joint, subtle focal 
increased uptake is present.  
Degenerative pattern of uptake RIGHT mid cervical spine at the facet. Right 
antecubital uptake likely related to pharmaceutical IV site administration.
IMPRESSION: Right posterior medial iliac bone lytic lesion demonstrates subtle radiotracer 
uptake compatible with metastatic focus. 
No additional bony metastasis.

## 2022-01-28 IMAGING — CT CT CHEST/ABDOMEN AND PELVIS WITH CONTRAST
1 of 3 series · 12 of 32 positions shown, 17 images · IV contrast (agent unspecified)
Comparison: Bone scan of 11/12/2021. Exams dating back to PET/CT exam of 
12/09/2019.

________________________________________________________________________________________________ 
CT CHEST/ABDOMEN AND PELVIS WITH CONTRAST, 01/28/2022 [DATE]: 
(Films were taken at [facility].)  
CLINICAL INDICATION:  Lung cancer metastases. Malignant neoplasm overlapping 
sites of unspecified bronchus and lung. Secondary malignant neoplasm of bone. 
A search for DICOM formatted images was conducted for prior CT imaging studies 
completed at a non-affiliated media free facility.
TECHNIQUE: The region of interest was scanned with 100 mL IV contrast on a high 
resolution CT scanner.  Routine MPR reconstructions were performed.

[Series 2: cap w · axial · 0.86mm/px · z∈[-734,-182]mm · 12 of 208 slices shown, 17 images]
[im 12/208  soft-tissue]
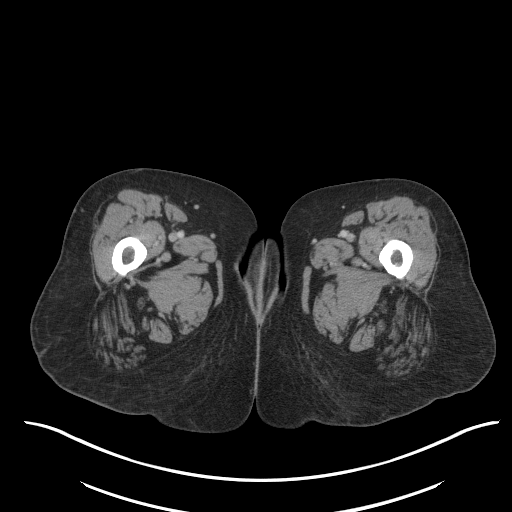
[im 12/208  bone]
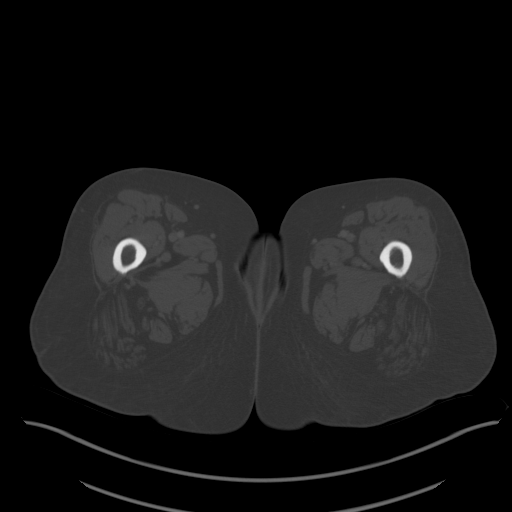
[im 35/208  soft-tissue]
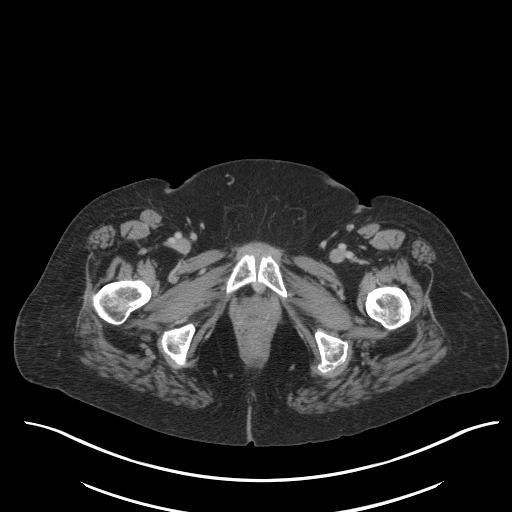
[im 47/208  soft-tissue]
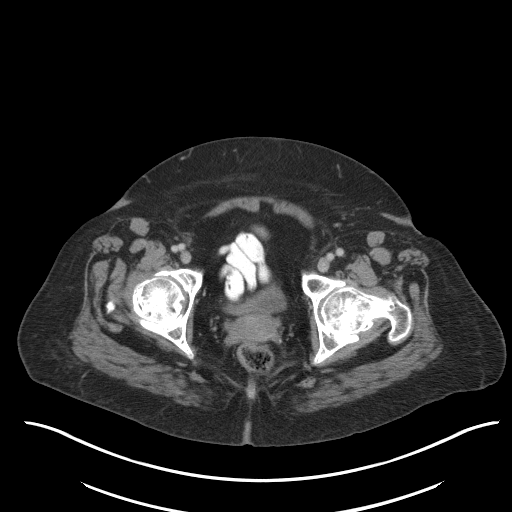
[im 70/208  soft-tissue]
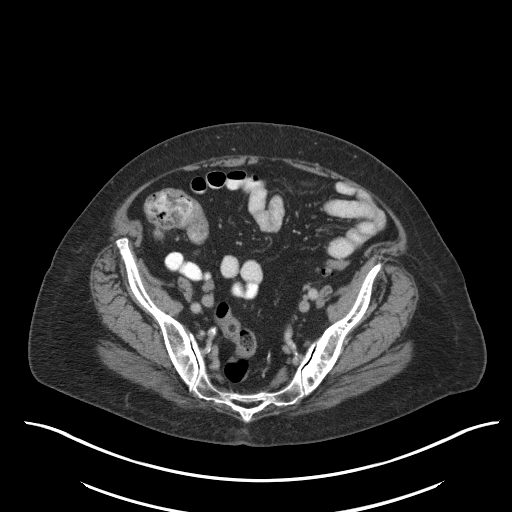
[im 81/208  soft-tissue]
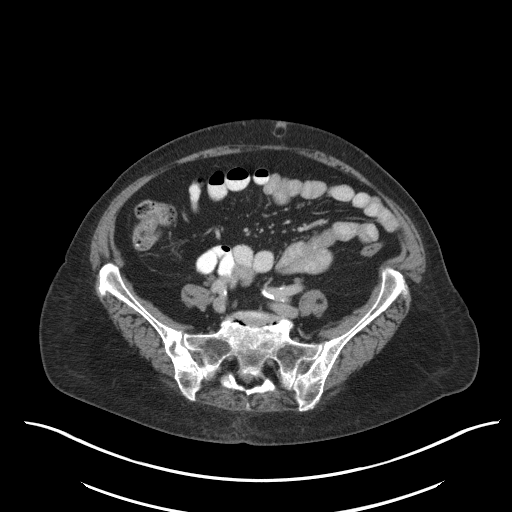
[im 104/208  soft-tissue]
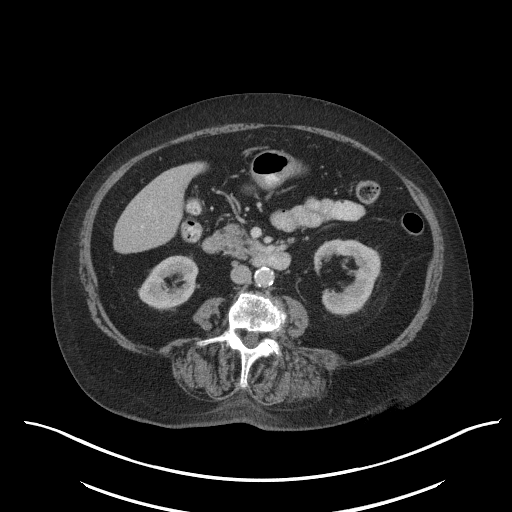
[im 127/208  soft-tissue]
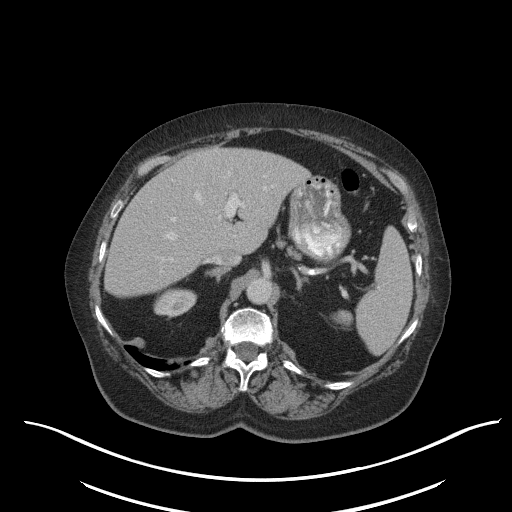
[im 139/208  soft-tissue]
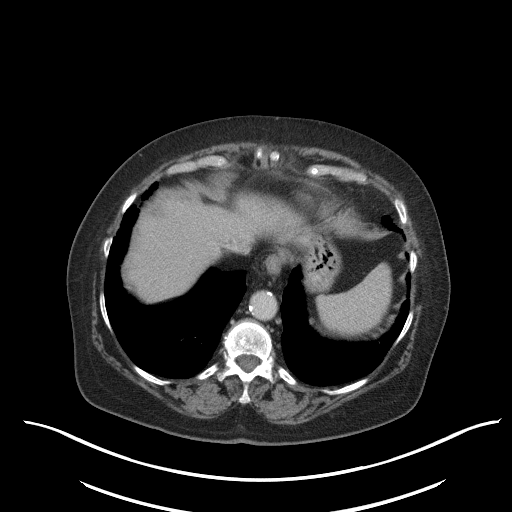
[im 162/208  soft-tissue]
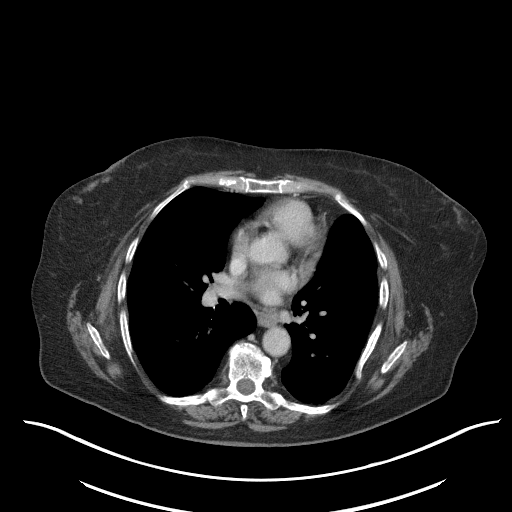
[im 162/208  lung]
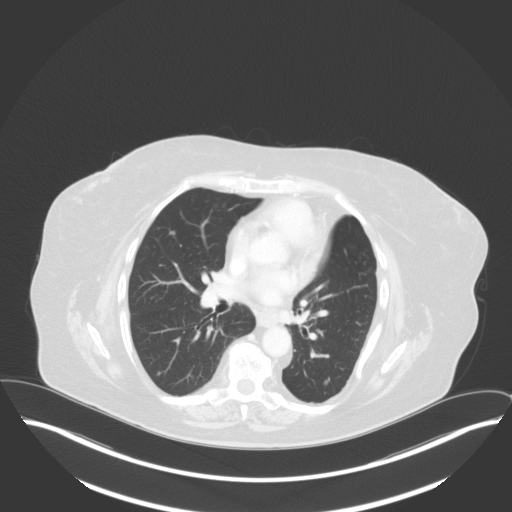
[im 162/208  bone]
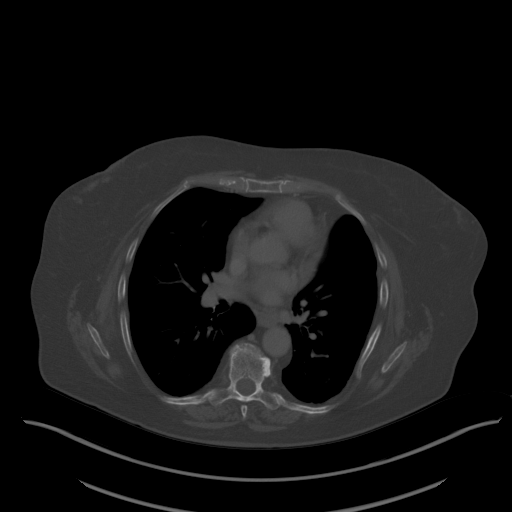
[im 173/208  soft-tissue]
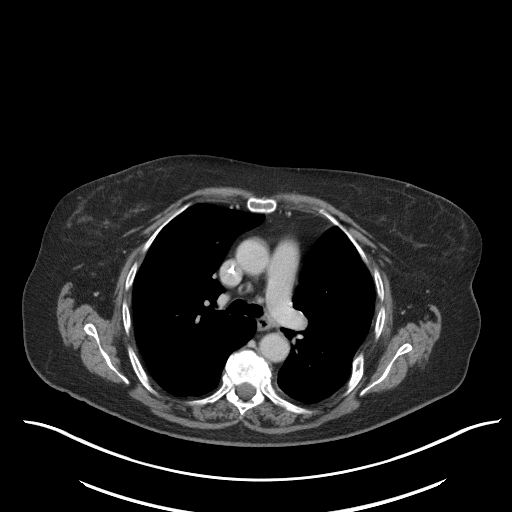
[im 173/208  lung]
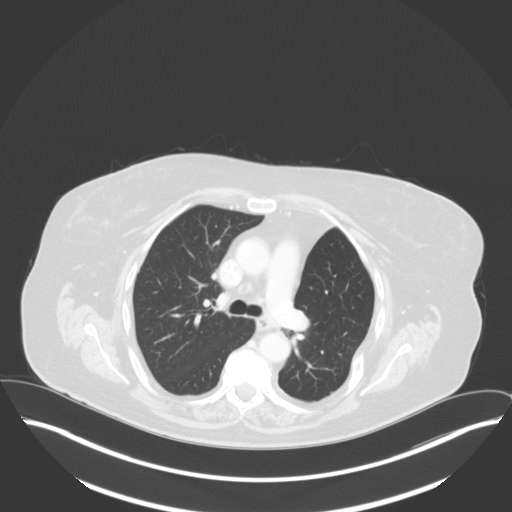
[im 185/208  lung]
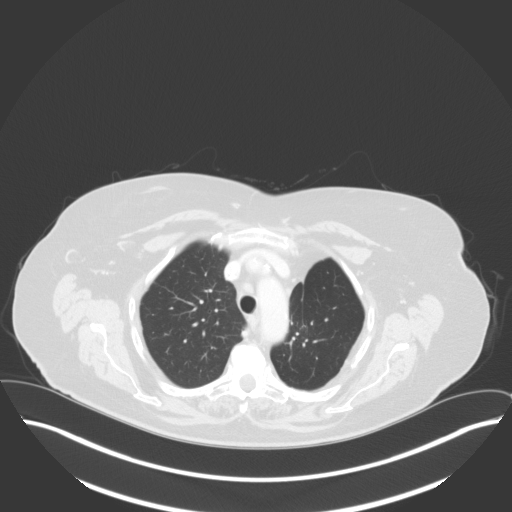
[im 196/208  soft-tissue]
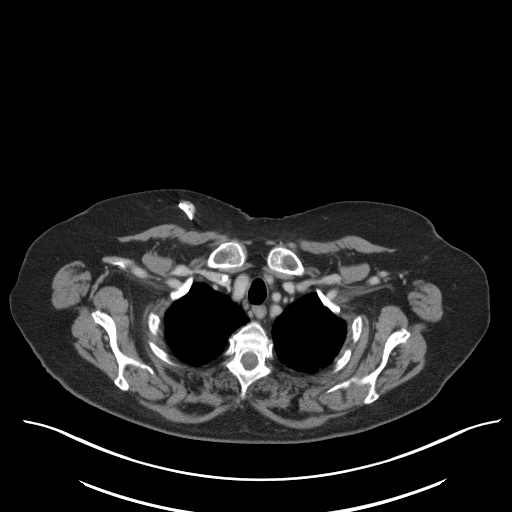
[im 196/208  lung]
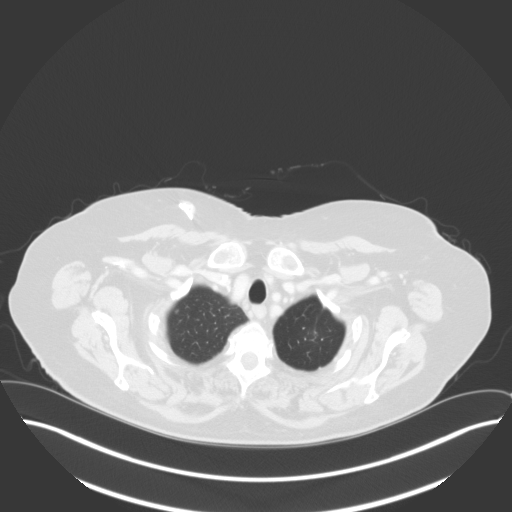

[12 of 32 positions shown; findings below may reference images not displayed]

FINDINGS: LUNGS: Postsurgical changes of left upper lobectomy are again seen with 
associated mild scarring. Stable subpleural micronodule right upper lobe, axial 
image 29. Stable subpleural scarring in the anterolateral right middle lobe. 
Stable subpleural micronodule right lower lobe, axial image 46. Stable 
subpleural nodule periphery of the left lower lobe, axial image 63. Stable 
nodule nodularity abutting the left hemidiaphragm. Stable nodule posterior left 
lower lobe, axial image 48. No new nodular pulmonary finding. No effusion. 
MEDIASTINUM:  Mediastinal nodes are again seen. Previously measured was a left 
paratracheal lymph node of 11 x 8 mm, unchanged, axial image 32. Previously 
measured with anterior pretracheal lymph node of 12 x 7 mm, unchanged, axial 
image 27. There are a few shotty additional mediastinal nodes. No new or 
progressive adenopathy. Normal heart size. No pericardial effusion. No hilar 
adenopathy.  
CHEST WALL/AXILLA: Right Port-A-Cath with tip within the distal SVC. No axillary 
lymphadenopathy. 
HEPATOBILIARY: There are a few subcentimeter areas of low-attenuation within the 
liver suggestive for hepatic cysts. No discrete abnormal enhancement to suggest 
hepatic metastasis. No gallstones. 
SPLEEN: Normal in size. 
PANCREAS: No ductal dilatation or mass.   
ADRENALS: No mass. 
GENITOURINARY: No enhancing mass or hydronephrosis.  Stable 1 mm left renal 
calyceal tip calculated. Bladder is negative. Calcified uterine fibroids. 
LYMPH NODES: No adenopathy. 
STOMACH, SMALL BOWEL AND COLON: Scattered colonic diverticula without acute 
diverticulitis. Bowel is normal in course and caliber without obstruction. No 
rectal wall thickening as well as suggestion the prior exams. 
VASCULAR STRUCTURES: No aneurysm. Scattered atheromatous arterial 
calcifications. 
MUSCULOSKELETAL: Stable appearance of the 2.3 x 1.2 x 2.6 cm lytic, mildly 
expansile lesion in the right posteromedial ilium with remodeling of the 
peripheral cortex and small cortical breaches with surrounding sclerosis. This 
is seen for example on series 2, image 128. No new osseous findings. Right L5 
pars defect with grade 1 anterolisthesis at L5-S1. Scattered degenerative 
changes. Suture anchors right humeral head. Small fat-containing left inguinal 
hernia.
IMPRESSION: 1.  Stable CT appearance without evidence of disease progression. 
2.  Osseous metastatic lesion involving the right posteromedial iliac bone. 
3.  Few scattered pulmonary nodules. 
RADIATION DOSE REDUCTION: All CT scans are performed using radiation dose 
reduction techniques, when applicable.  Technical factors are evaluated and 
adjusted to ensure appropriate moderation of exposure.  Automated dose 
management technology is applied to adjust the radiation doses to minimize 
exposure while achieving diagnostic quality images.

## 2022-02-04 IMAGING — NM BONE SCAN WHOLE BODY
2 series · 10 of 10 positions shown · non-contrast
Comparison: 01/28/2022 CT chest/abdomen/pelvis, 11/12/2021 bone scan and 05/28/2021 
PET/CT

________________________________________________________________________________________________ 
BONE SCAN WHOLE BODY, 02/04/2022 [DATE]: 
CLINICAL INDICATION: Secondary malignant neoplasm of bone. Malignant neoplasm of 
overlapping sites of unspecified bronchus or lung. NSCLC. Metastatic, assess 
treatment response.
TECHNIQUE: Following the injection of  24.4 mCi JUMPER Tc 99m MDP scanning of the 
entire skeleton was performed.

[Series 1000: bone statics · 4.80mm/px · 4 acquisitions, 8 frames shown]
[im 1/4]
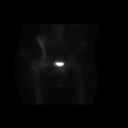
[im 1/4]
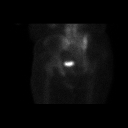
[im 2/4]
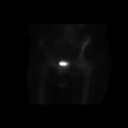
[im 2/4]
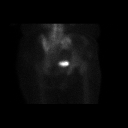
[im 3/4]
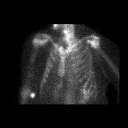
[im 3/4]
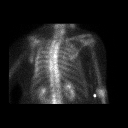
[im 4/4]
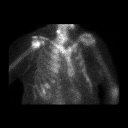
[im 4/4]
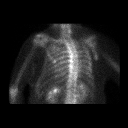

[Series 1000: wholebody · 2.40mm/px · 2 of 2 frames shown]
[frame 1/2]
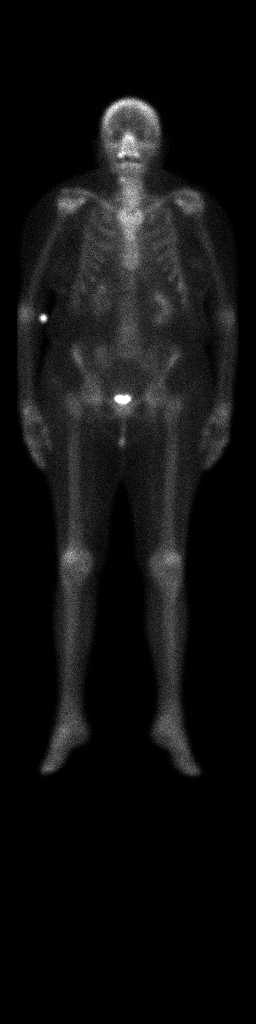
[frame 2/2]
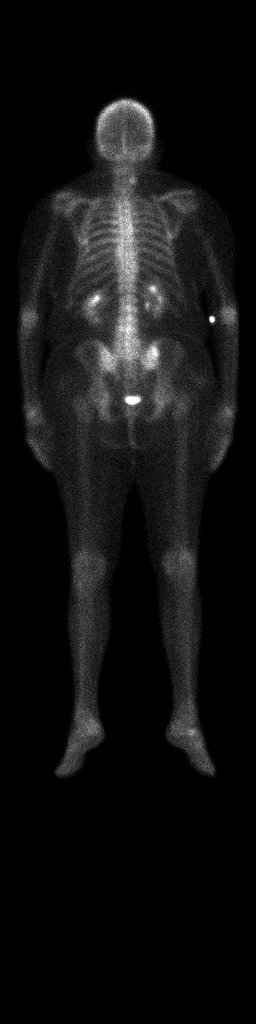

[10 of 10 positions shown; findings below may reference images not displayed]

FINDINGS: Physiologic uptake of radiotracer. Stable mild increased radiotracer 
uptake in the right posteromedial ilium, corresponding to lytic osseous 
metastasis demonstrated on CT. No new lesions. Mild increased radiotracer uptake 
in the right mid cervical spine, corresponding to degenerative change on PET/CT. 
Mild increased radiotracer uptake in the right maxilla, likely due to 
periodontal disease. Mildly increased radiotracer uptake in the right shoulder, 
wrists and feet, consistent with degenerative change.
IMPRESSION: Stable mild increased radiotracer uptake in the right posteromedial ilium, 
corresponding to lytic osseous metastasis demonstrated on CT. No new lesions.

## 2022-06-17 IMAGING — CT CT CHEST/ABDOMEN AND PELVIS WITH CONTRAST
1 of 3 series · 12 of 32 positions shown, 17 images · IV contrast (isovue)
Comparison: CT chest abdomen pelvis January 28, 2022.

________________________________________________________________________________________________ 
CT CHEST/ABDOMEN AND PELVIS WITH CONTRAST, 06/17/2022 [DATE]: 
CLINICAL INDICATION:  Follow-up metastatic non-small cell lung cancer. 
A search for DICOM formatted images was conducted for prior CT imaging studies 
completed at a non-affiliated media free facility.
TECHNIQUE: The region of interest was scanned with 80 ml of Isovue 370 injected 
intervenously on a high resolution CT scanner.  Routine MPR reconstructions were 
performed.

[Series 2: cap w · axial · 0.77mm/px · z∈[-498,+38]mm · 12 of 205 slices shown, 17 images]
[im 13/205  soft-tissue]
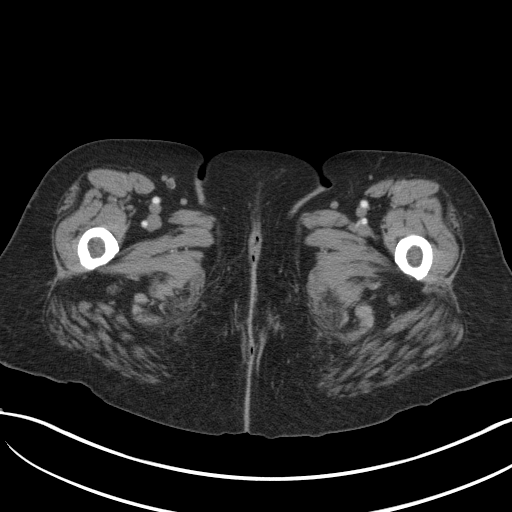
[im 13/205  bone]
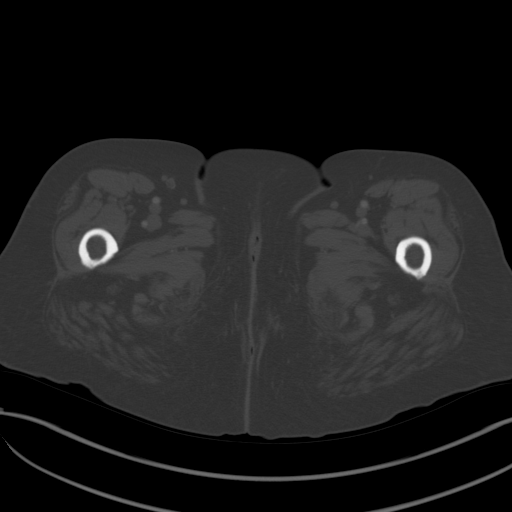
[im 39/205  soft-tissue]
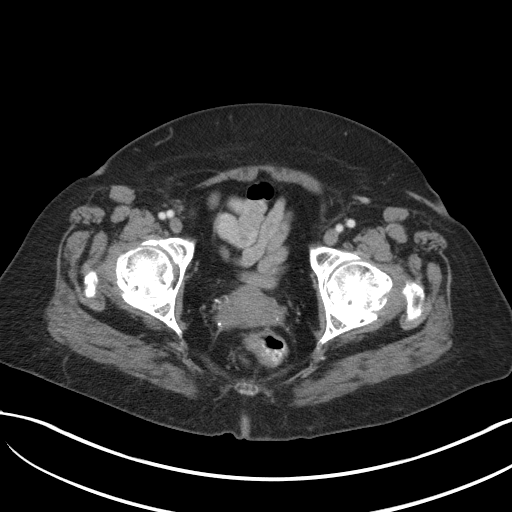
[im 52/205  soft-tissue]
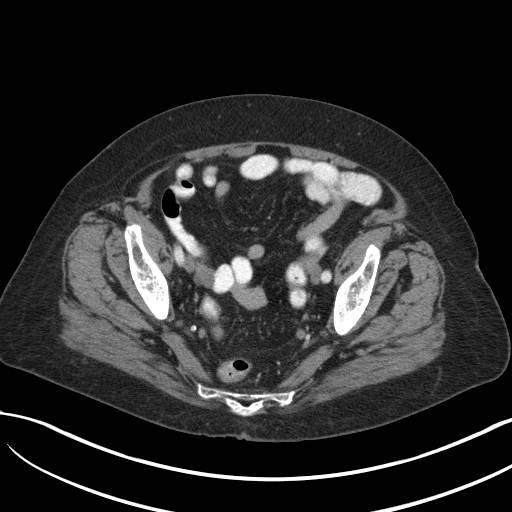
[im 64/205  soft-tissue]
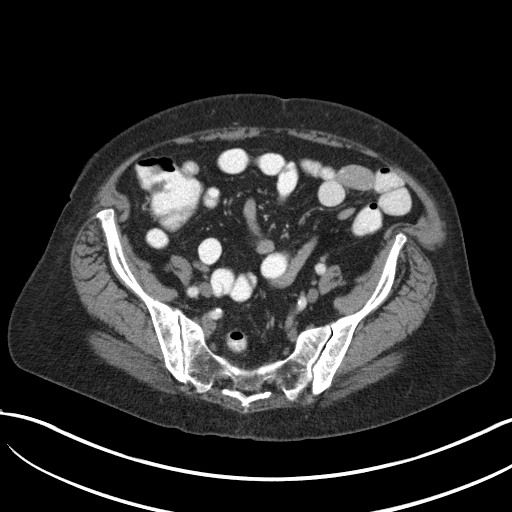
[im 90/205  soft-tissue]
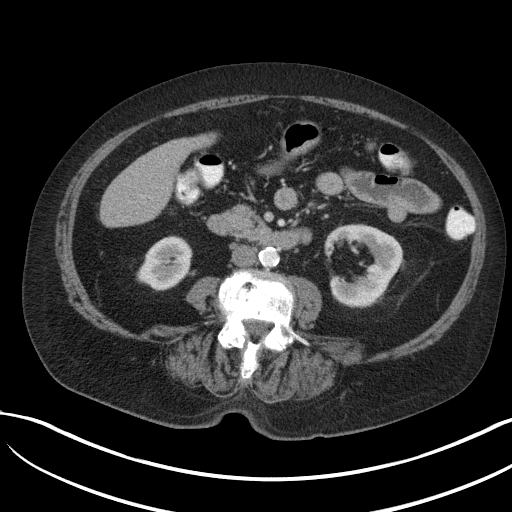
[im 103/205  soft-tissue]
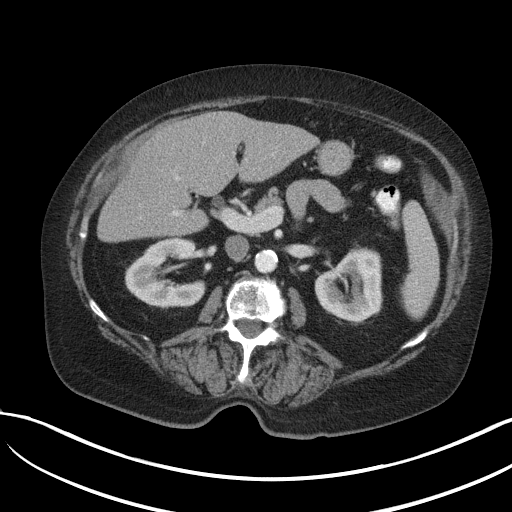
[im 115/205  soft-tissue]
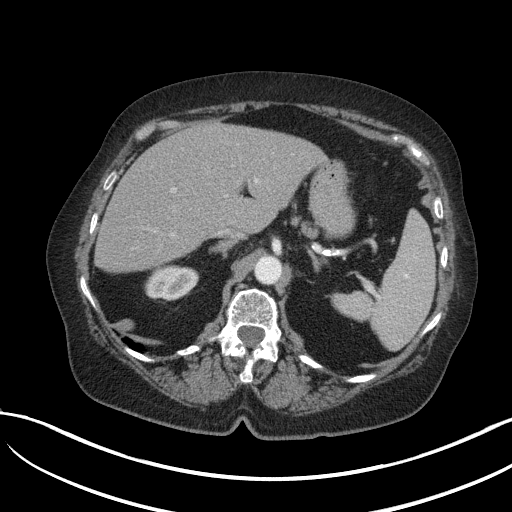
[im 141/205  soft-tissue]
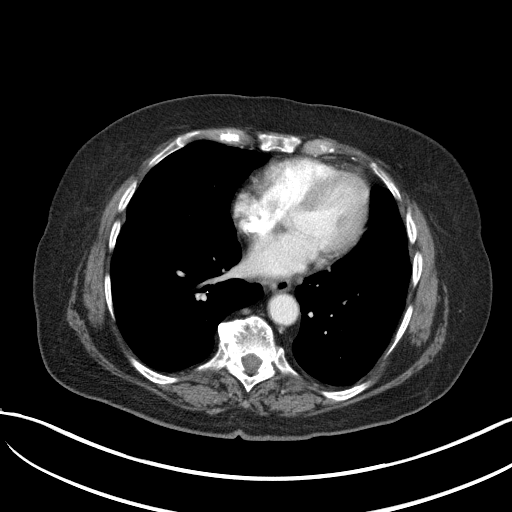
[im 154/205  soft-tissue]
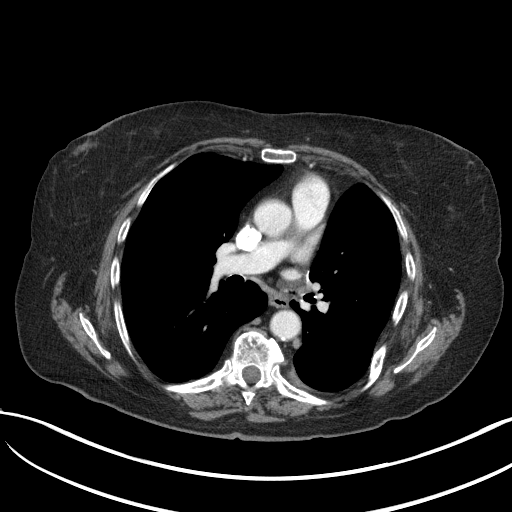
[im 154/205  lung]
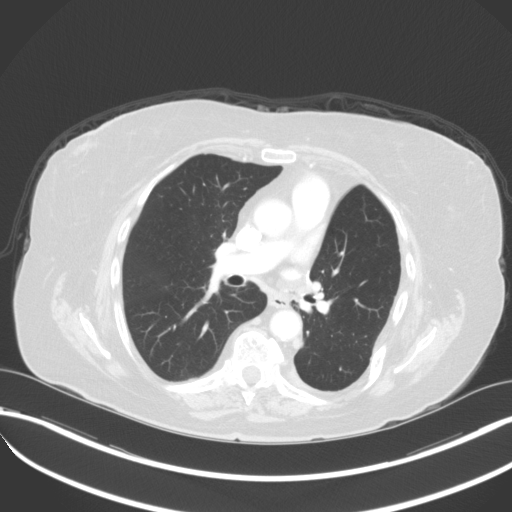
[im 154/205  bone]
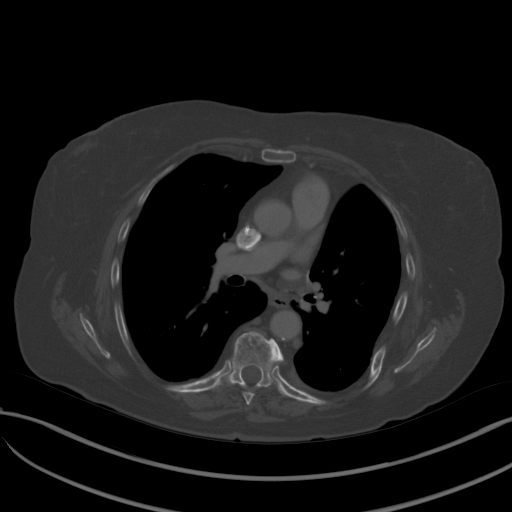
[im 166/205  soft-tissue]
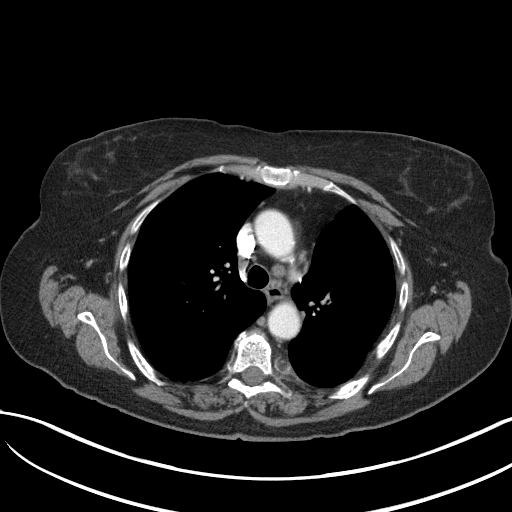
[im 166/205  lung]
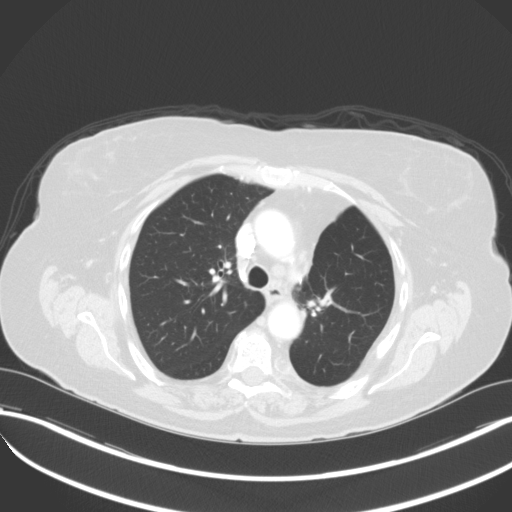
[im 179/205  lung]
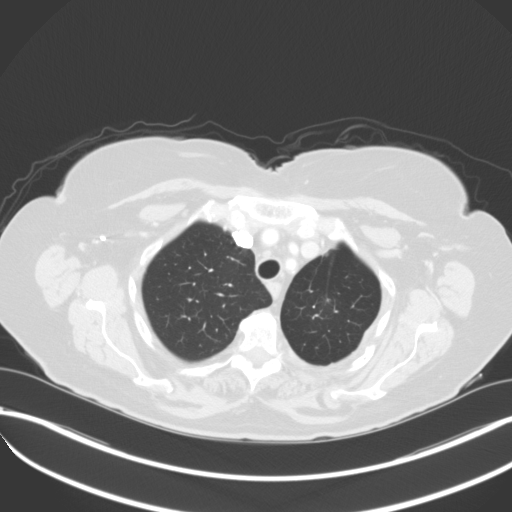
[im 192/205  soft-tissue]
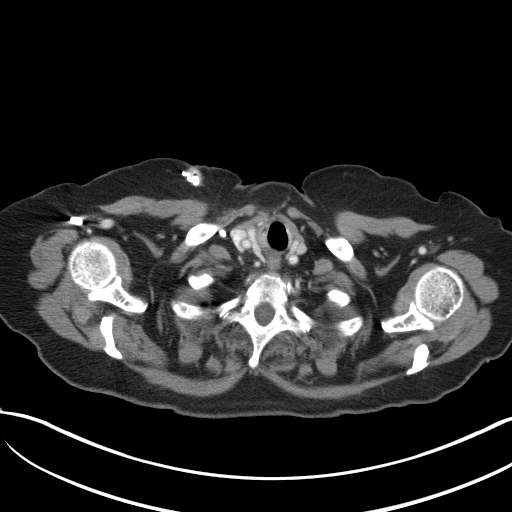
[im 192/205  lung]
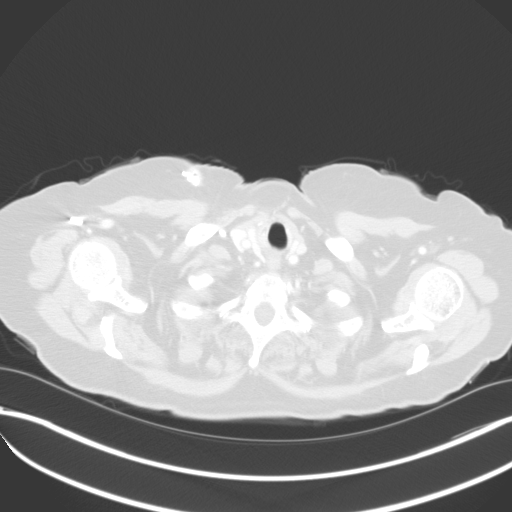

[12 of 32 positions shown; findings below may reference images not displayed]

FINDINGS: CT chest: 
There are multiple small stable lung nodules most likely incidental. No new lung 
nodules or mass lesions are present. There has been LEFT upper lobectomy. There 
are no pleural effusions. There is a mildly enlarged paraesophageal lymph node 
posterior to the LEFT atrium showing short axis measurement of 12 mm, previously 
14 mm. All of the remaining mediastinal lymph nodes are normal in size and 
stable. There is no hilar or axillary lymphadenopathy. The heart is normal in 
size. Minimal coronary artery calcifications are present. No incidental 
pulmonary emboli are found. Caliber of the central pulmonary arteries and 
thoracic aorta are normal. The esophagus is within normal limits. Limited 
imaging of the inferior neck shows no significant incidental findings. There is 
a RIGHT-sided MediPort implanted in the chest. No abnormal axillary lymph nodes 
are found. There is no breast pathology. Degenerative changes are incidentally 
noted in the thoracic spine. 
CT abdomen pelvis: 
Small stable hypodensities in the liver consistent with cysts. No suspicious 
hepatic lesions are present. The gallbladder is absent. Biliary tract is normal 
in caliber. The pancreas, spleen, and adrenal glands are within normal limits. 
There are tiny nonobstructing LEFT renal calculi. Kidneys otherwise 
unremarkable. Urinary bladder is within normal limits. Uterus is normal in size 
for age. There is a small partly calcified fibroid. Uncomplicated colonic 
diverticulosis is present. No clinically significant GI tract abnormalities are 
found. Abdominal aorta is normal in caliber. Moderate to severe atherosclerotic 
calcifications are present. No abnormal abdominal or pelvic lymph nodes. No 
ascites. Incidental degenerative changes are in the spine. The previously 
described mixed lytic and sclerotic lesion in the LEFT posterior iliac bone 
shows a slight degree of interval re-ossification suggesting attempted healing. 
Severe degenerative changes are in both hips. Small fat-containing LEFT inguinal 
hernia.
IMPRESSION: 1. The skeletal metastasis in the posterior RIGHT iliac bone shows evidence of 
slight interval reossification suggesting healing change. No other suspicious 
skeletal lesions are found elsewhere. 
2. There is a single mildly enlarged lymph node in the lower mediastinum 
adjacent to the esophagus showing short axis of 12 mm, previously 14 mm 
indicating interval improvement. No other potentially significant lymph nodes 
found elsewhere. 
3. No evidence for interval development of hepatic metastasis. 
4. Small stable lung nodules. No new lung nodules. 
RADIATION DOSE REDUCTION: All CT scans are performed using radiation dose 
reduction techniques, when applicable.  Technical factors are evaluated and 
adjusted to ensure appropriate moderation of exposure.  Automated dose 
management technology is applied to adjust the radiation doses to minimize 
exposure while achieving diagnostic quality images.

## 2022-06-18 IMAGING — NM BONE SCAN WHOLE BODY
1 series · 2 of 2 positions shown · non-contrast
Comparison: Bone scan 02/04/2022, 11/12/2021, CT chest abdomen and pelvis 
06/17/2022, 01/28/2022, 11/01/2021

________________________________________________________________________________________________ 
BONE SCAN WHOLE BODY, 06/18/2022 [DATE]: 
CLINICAL INDICATION: Metastatic non-small cell lung cancer diagnosed November 2021 
treated with chemotherapy and immunotherapy, currently on Keytruda.
TECHNIQUE: Following the injection of  25.3 mCi RENE ALEKSANDER Tc 99m MDP scanning of the 
entire skeleton was performed.

[Series 1000: wholebody · 2.40mm/px · 2 of 2 frames shown]
[frame 1/2]
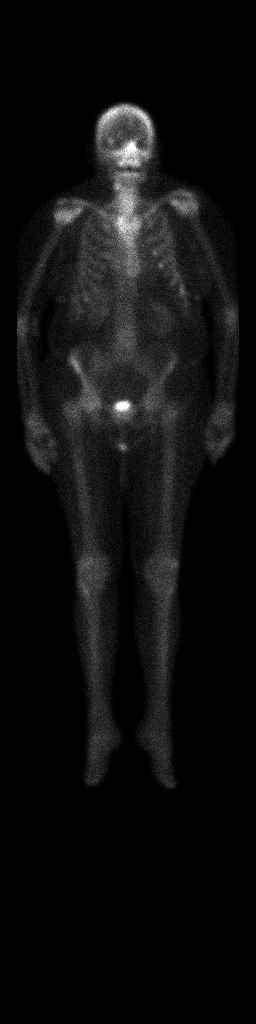
[frame 2/2]
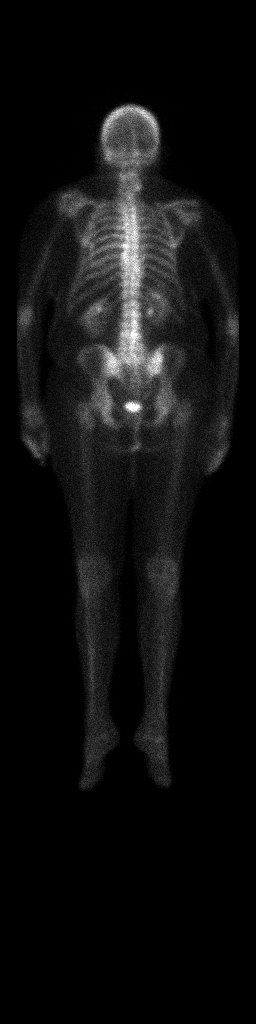

[2 of 2 positions shown; findings below may reference images not displayed]

FINDINGS: Physiologic distribution of the radiopharmaceutical. 
Stable mild increased radiotracer uptake in the RIGHT posterior medial ilium. 
Mild degenerative pattern of uptake in the mid cervical spine, RIGHT shoulder, 
and RIGHT maxilla/RIGHT mandible unchanged.
IMPRESSION: Stable exam. CT 06/17/2022 demonstrated mild interval reossification suggesting 
healing change of the RIGHT iliac lesion.

## 2022-10-04 IMAGING — NM BONE SCAN WHOLE BODY
1 series · 2 of 2 positions shown · non-contrast
Comparison: 06/18/2022, 02/04/2022, CT 06/17/2022, 01/28/2022

________________________________________________________________________________________________ 
BONE SCAN WHOLE BODY, 10/04/2022 [DATE]: 
CLINICAL INDICATION: Metastatic lung cancer, assess treatment response, 
currently on Keytruda
TECHNIQUE: Following the injection of  27.5 mCi LILIBETH Tc 99m MDP scanning of the 
entire skeleton was performed.

[Series 1000: wholebody · 2.40mm/px · 2 of 2 frames shown]
[frame 1/2]
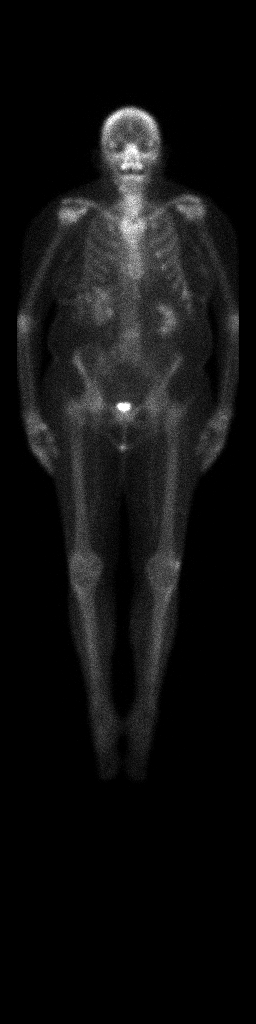
[frame 2/2]
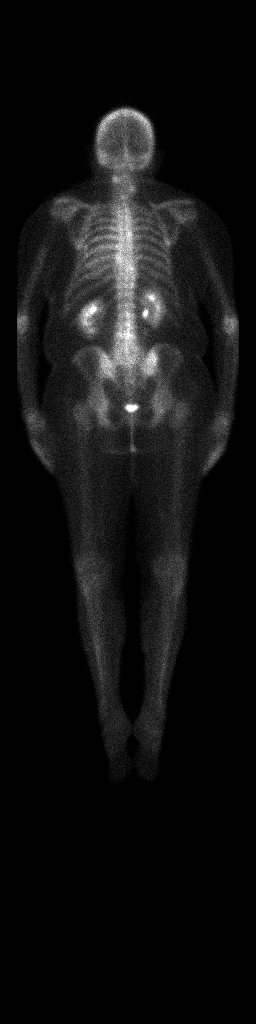

[2 of 2 positions shown; findings below may reference images not displayed]

FINDINGS: Physiologic distribution of the radiopharmaceutical. Degenerative 
pattern of uptake upper and lower cervical, mid and lower thoracic spine. Stable 
mild increased uptake RIGHT posterior medial iliac bone. Mild diffuse focal 
uptake L4 without definite CT correlate with, may be degenerative. Seen best on 
the planar anterior/posterior whole body images four array.
IMPRESSION: 1.  Subtle L4 uptake without CT correlate [DATE]. Consider follow-up 
staging CT or MR lumbar. Findings may be degenerative. 
2.  Otherwise, stable exam.

## 2022-10-07 IMAGING — CT CT CHEST/ABDOMEN AND PELVIS WITH CONTRAST
2 of 4 series · 11 of 36 positions shown, 15 images · IV contrast (agent unspecified)
Comparison: 06/17/2022

________________________________________________________________________________________________ 
CT CHEST/ABDOMEN AND PELVIS WITH CONTRAST, 10/07/2022 [DATE]: 
CLINICAL INDICATION: Metastatic lung carcinoma 
A search for DICOM formatted images was conducted for prior CT imaging studies 
completed at a non-affiliated media free facility.
TECHNIQUE: The chest, abdomen and pelvis were scanned from base of neck through 
the pubic rami with 80 ml of Psovue-2RH injected intravenously on a high 
resolution CT scanner. Routine MPR reconstructions were performed. Count of 
known CT and Cardiac Nuclear Medicine studies performed in the previous 12 
months = 4

[Series 2: cap w · axial · 0.95mm/px · z∈[-667,-118]mm · 10 of 221 slices shown, 13 images]
[im 25/221  soft-tissue]
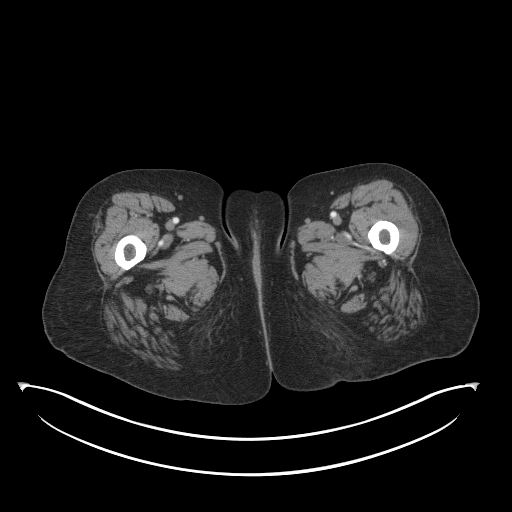
[im 25/221  bone]
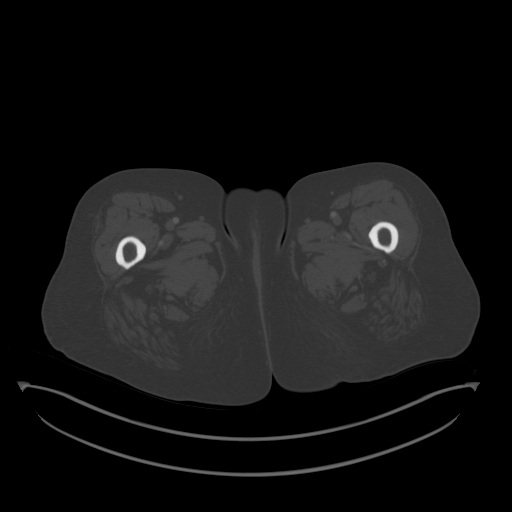
[im 49/221  soft-tissue]
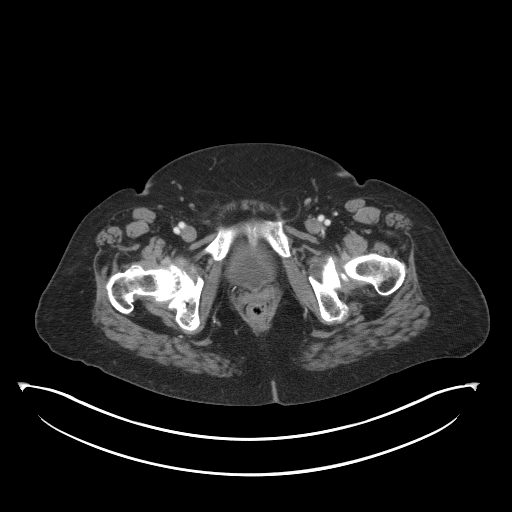
[im 74/221  soft-tissue]
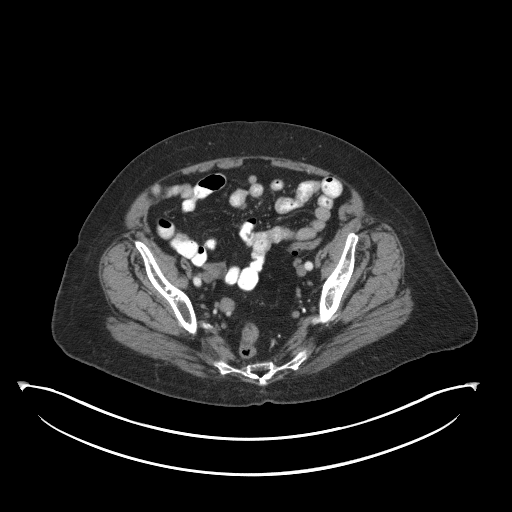
[im 98/221  soft-tissue]
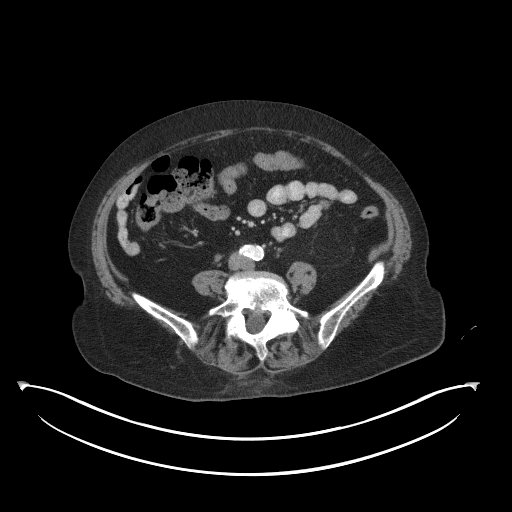
[im 123/221  soft-tissue]
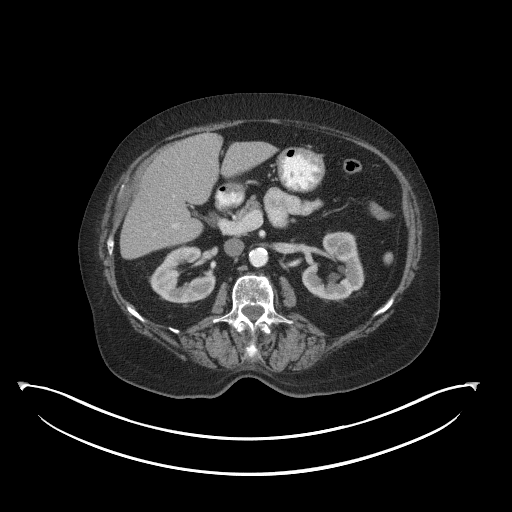
[im 147/221  soft-tissue]
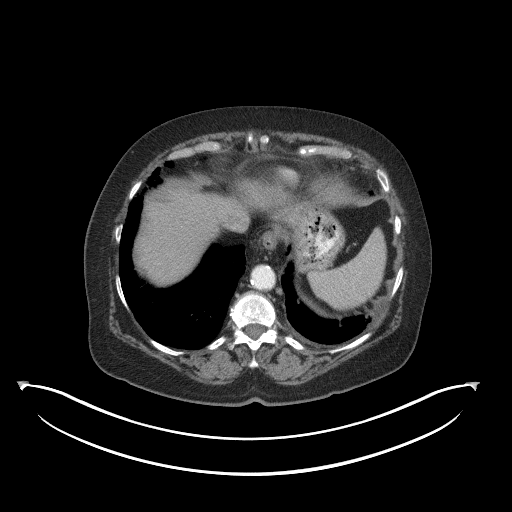
[im 172/221  soft-tissue]
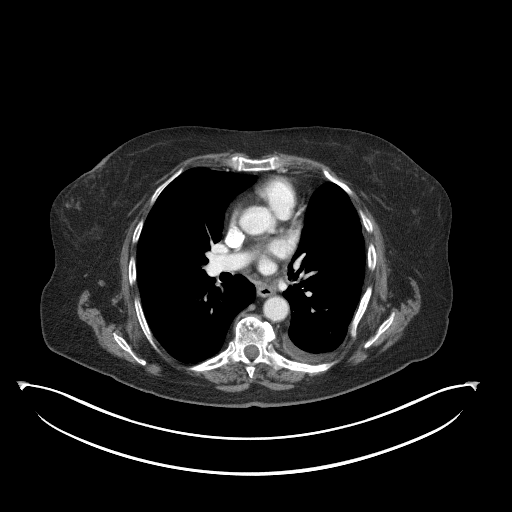
[im 172/221  lung]
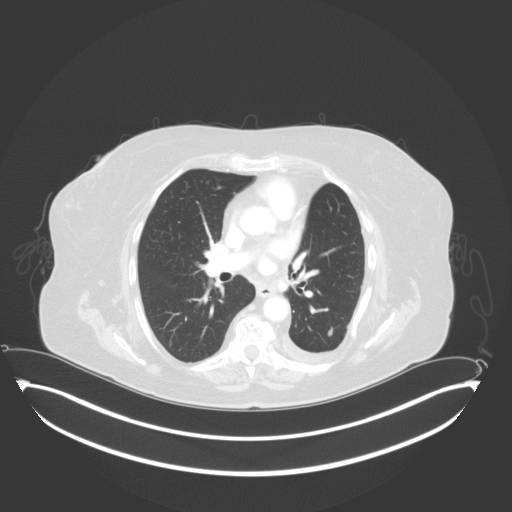
[im 184/221  lung]
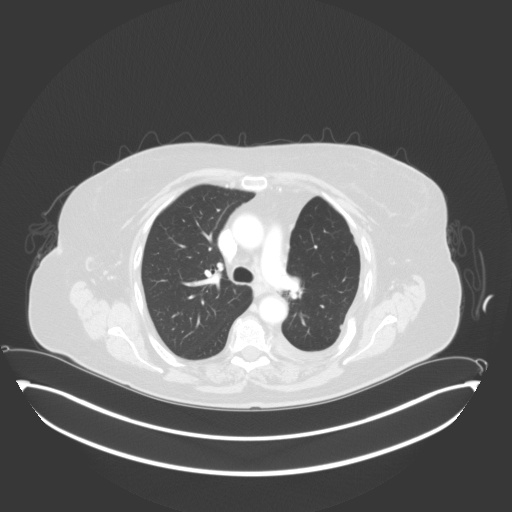
[im 196/221  soft-tissue]
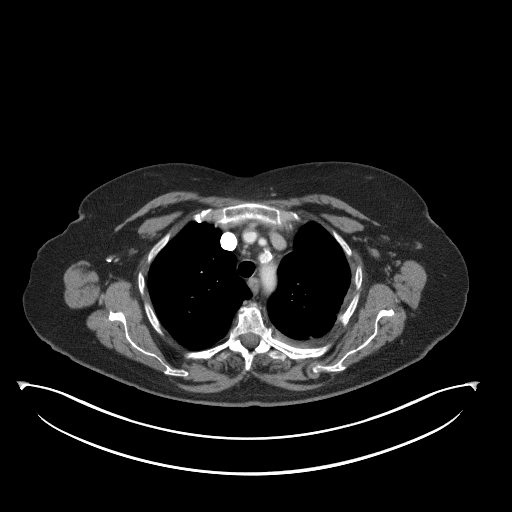
[im 196/221  lung]
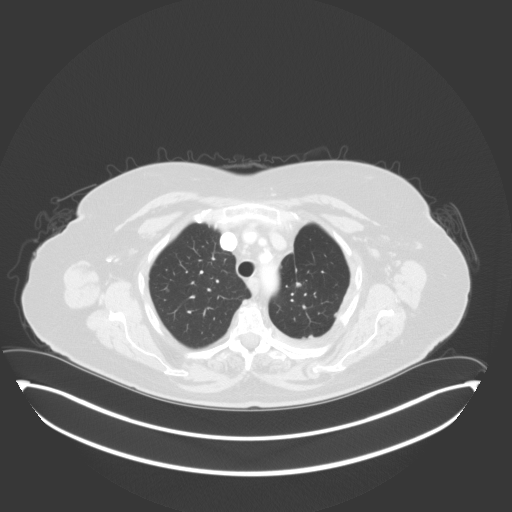
[im 208/221  lung]
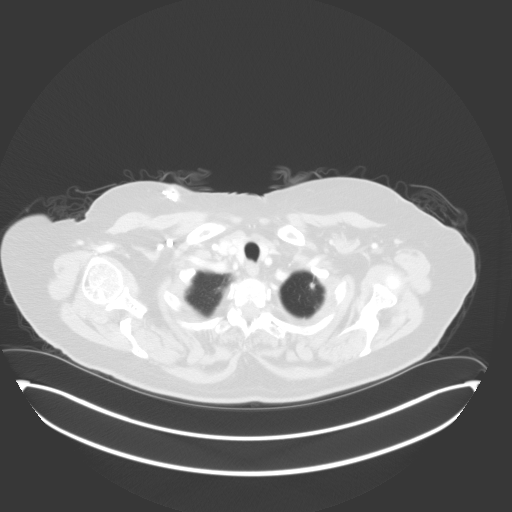

[Series 5: cap w sag · sagittal · 0.60mm/px · 1 of 78 slices shown, 2 images]
[im 26/78  soft-tissue]
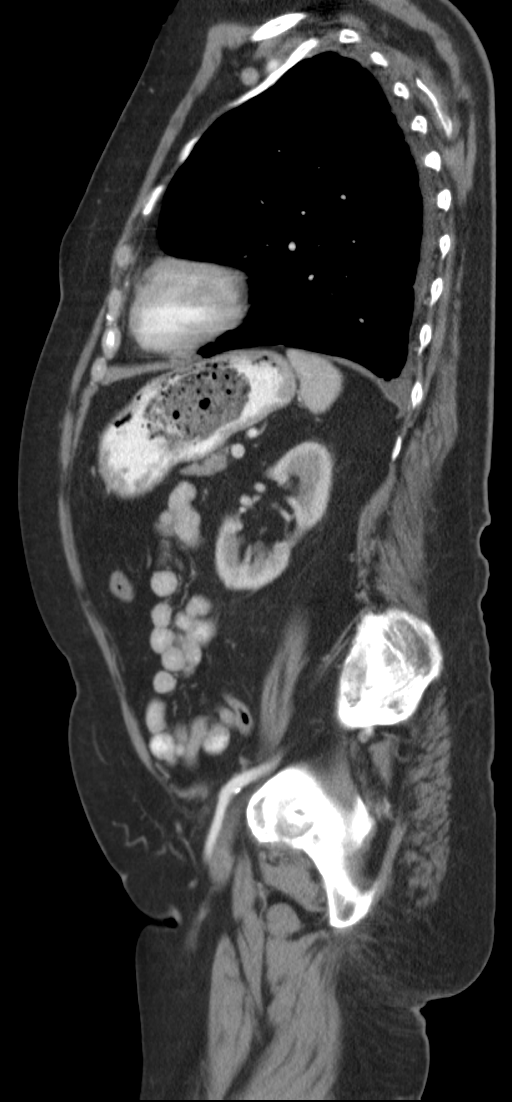
[im 26/78  bone]
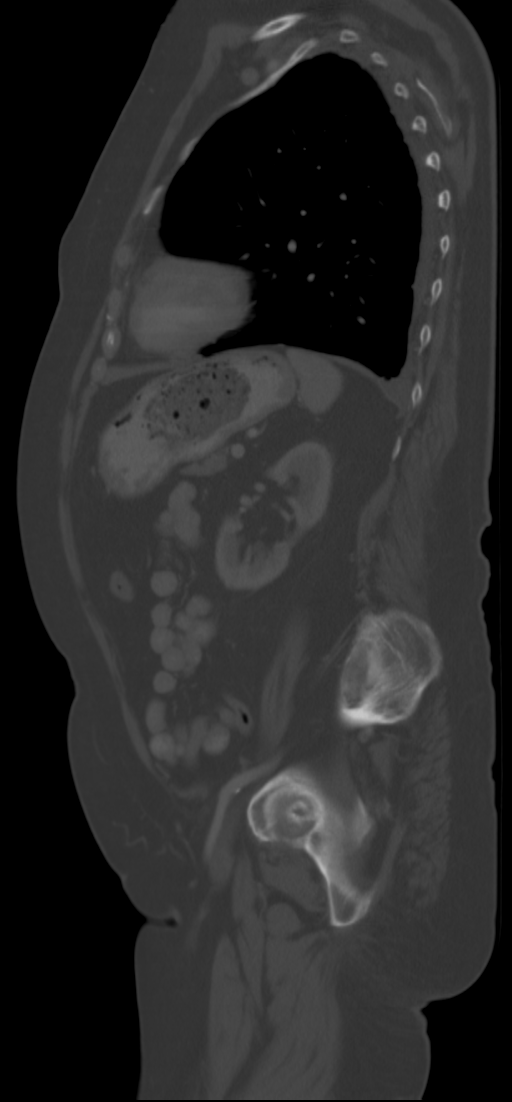

[11 of 36 positions shown; findings below may reference images not displayed]

FINDINGS: LUNGS AND PLEURA:  There is a developing trace left pleural effusion. There also 
appears to be developing progressive numerous pleural nodules. These are seen 
previously though appear to be progressive over the prior 2 exams and Im 
concerned for pleural metastasis. There are scattered subcentimeter nodules 
elsewhere which appear to be stable within the lung parenchyma bilaterally. 
MEDIASTINUM:  Stable mediastinal lymph nodes. The posterior mediastinal lymph 
node previously with a short axis of 12 mm is thought to be stable in appearance 
on axial image 53. Postoperative changes are seen. Mild coronary calcifications. 
CHEST WALL/AXILLA: Right-sided port. 
HEPATOBILIARY: No evidence for mass or biliary dilatation. Previous 
cholecystectomy. 
SPLEEN: Normal in size. 
PANCREAS: No ductal dilatation or mass.   
ADRENALS: No mass. 
GENITOURINARY: No evidence for enhancing mass, stones or hydronephrosis. No 
bladder mass. 
LYMPH NODES: No adenopathy. 
STOMACH, SMALL BOWEL AND COLON: No bowel wall thickening or obstruction. 
VASCULAR STRUCTURES: Atherosclerotic changes without aneurysmal dilatation.  
MUSCULOSKELETAL: Stable sclerotic appearance to the right iliac bone. No new 
osseous lesion seen. Degenerative changes.  
ADDITIONAL FINDINGS: Uterus and adnexa are unremarkable in appearance.
IMPRESSION: Developing small left effusion with concerning for progressive developing 
pleural nodules within the left thorax. Would consider PET scan to better 
evaluate. 
Stable posterior mediastinal lymph node compared to the prior examination. 
No new mass identified elsewhere. 
Sclerosis of the right iliac bone unchanged. No abnormality seen involving L4 to 
correlate to the bone scan abnormality. 
RADIATION DOSE REDUCTION: All CT scans are performed using radiation dose 
reduction techniques, when applicable.  Technical factors are evaluated and 
adjusted to ensure appropriate moderation of exposure.  Automated dose 
management technology is applied to adjust the radiation doses to minimize 
exposure while achieving diagnostic quality images.

## 2022-10-15 IMAGING — MR MRI BRAIN W/WO CONTRAST
2 of 16 series · 5 of 48 positions shown · IV contrast (gadavist)
Comparison: MRI brain without and with contrast.

________________________________________________________________________________________________ 
MRI BRAIN W/WO CONTRAST, 10/15/2022 [DATE]: 
CLINICAL INDICATION: Lung cancer.
TECHNIQUE: Multiplanar, multiecho position MR images of the brain were performed 
without and with 7 mL of Gadavist were injected intravenously by hand. 0.5 mL of 
Gadavist were discarded.  Patient was scanned on a 1.5T magnet.

[Series 1102: T1 post-contrast · coronal · 1.0mm · 0.24mm/px · 3 of 180 slices shown (1 of 2)]
[im 23/180]
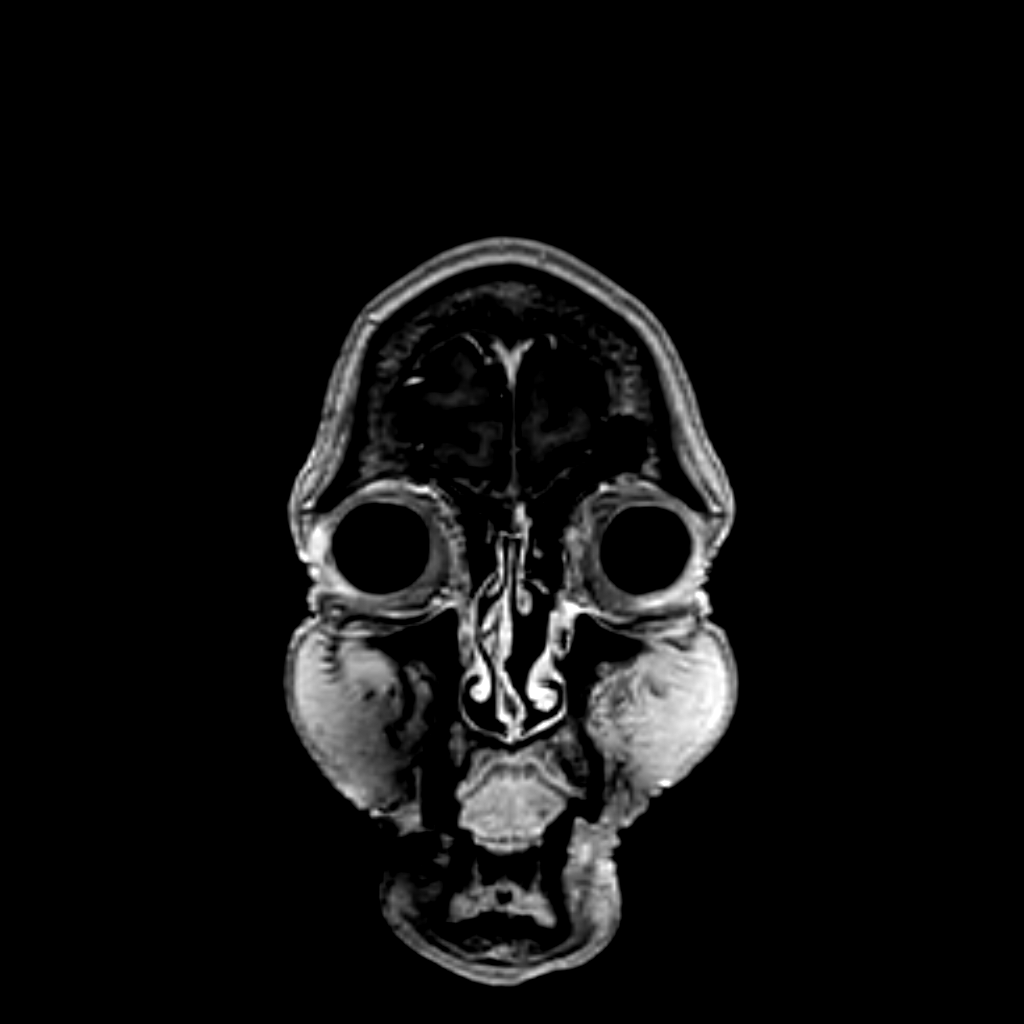
[im 90/180]
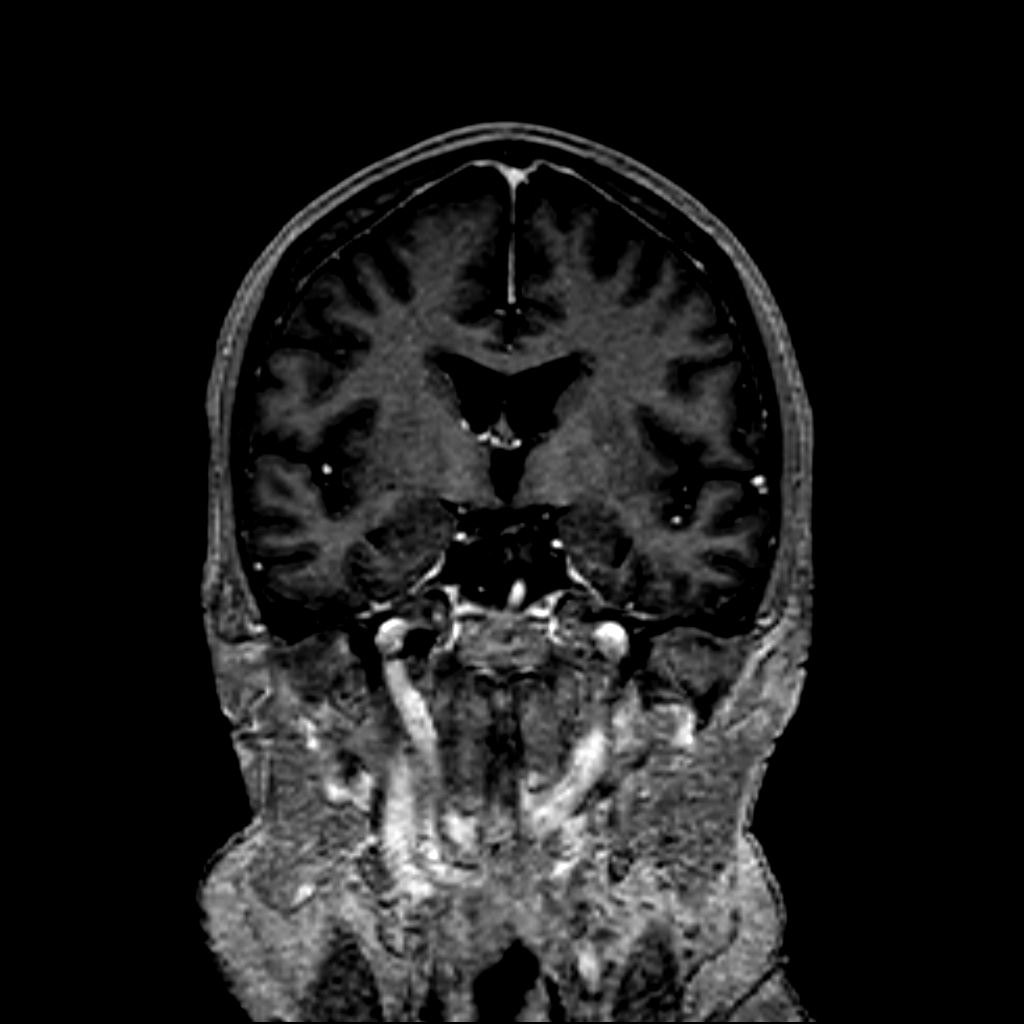
[im 157/180]
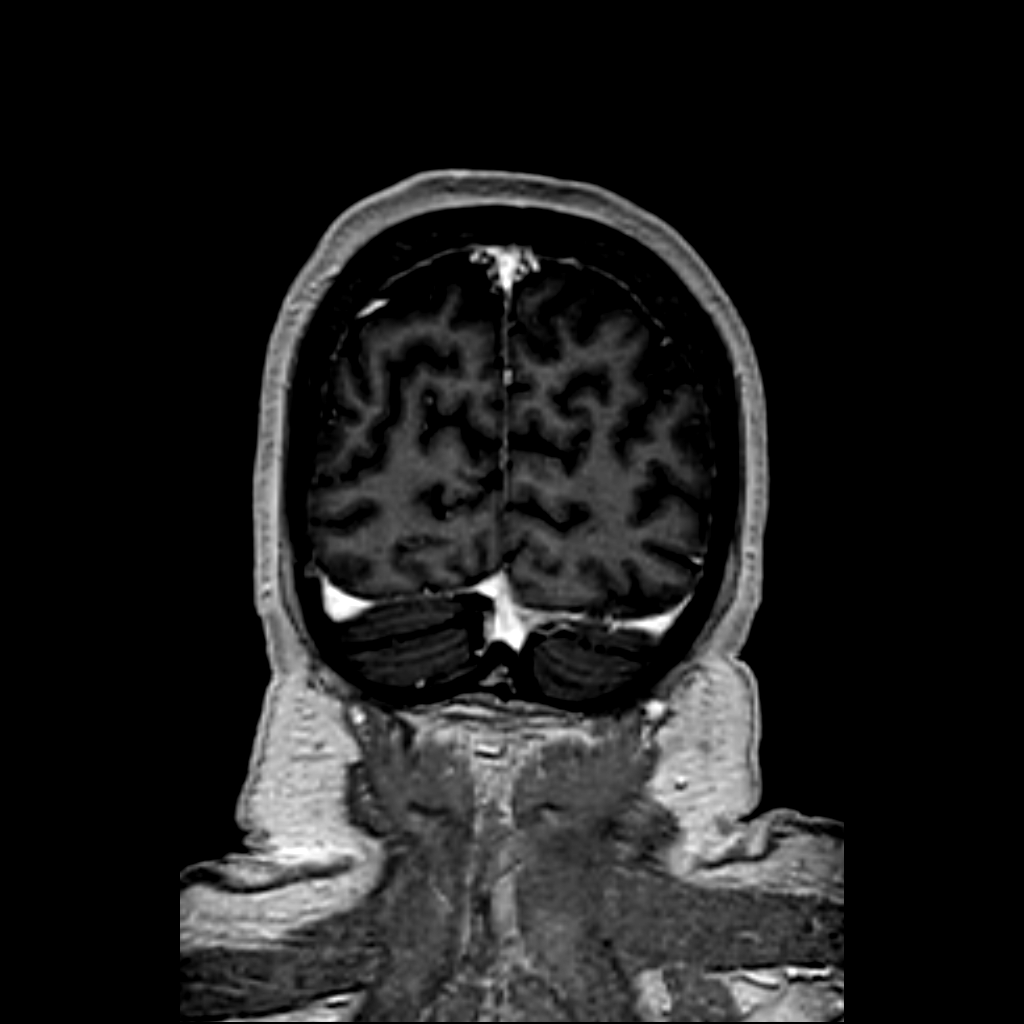

[Series 1103: T1 post-contrast · axial · 1.0mm · 0.24mm/px · z∈[-120,-48]mm · 2 of 170 slices shown (2 of 2)]
[im 25/170]
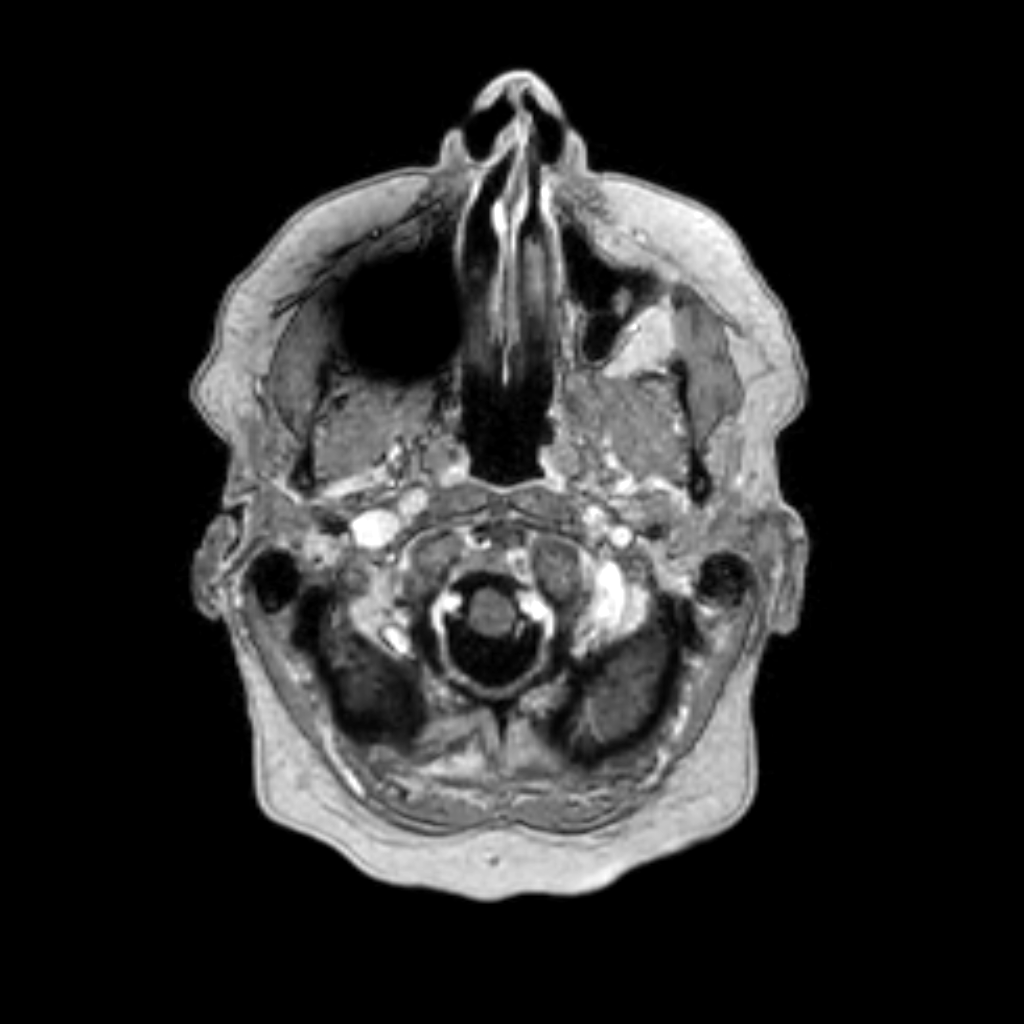
[im 97/170]
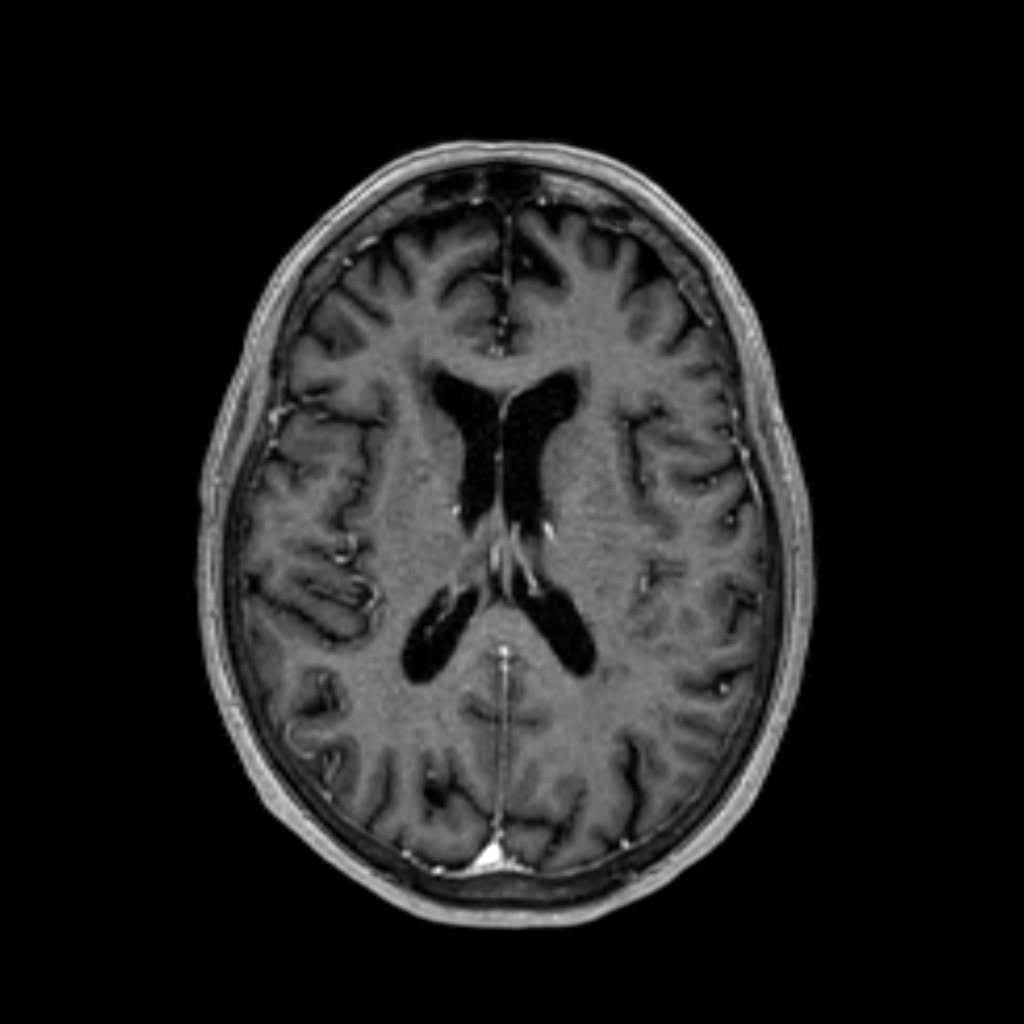

[5 of 48 positions shown; findings below may reference images not displayed]

FINDINGS: -------------------------------------------------------------------------------- 
------------------------- 
INTRACRANIAL: 
No acute ischemia. No abnormal foci of susceptibility artifact in the brain. 
Patency of the intracranial vascular flow voids. Periventricular and deep white 
matter change, probably secondary to microangiopathy. This is stable.  No acute 
intracranial hemorrhage, mass effect, midline shift. No large sellar mass. No 
hydrocephalus. Cerebral volume is age appropriate.  No pathologic contrast 
enhancement.  
-------------------------------------------------------------------------------- 
----------------------- 
OTHER: 
ORBITS/SINUSES/T-BONES:  Visualized orbits show no acute abnormality or mass.  
Mild fluid in the left mastoid air cells.  Mild mucosal changes in the paranasal 
sinuses 
MARROW SIGNAL/SOFT TISSUES: No focal suspect signal abnormality. 
-------------------------------------------------------------------------------- 
-------------------
IMPRESSION: 1.  No metastatic disease in the brain. 
2.  No acute intracranial abnormality.  White matter microangiopathic changes. 
3.  Mild fluid in the left mastoid air cells, nonspecific.

## 2022-10-24 IMAGING — PT PET CT SCAN TUMOR IMAGING SKULL TO THIGH
1 of 3 series · 1 of 25 positions shown · non-contrast
Comparison: CT chest abdomen pelvis October 07, 2022, PET/CT May 28, 2021

________________________________________________________________________________________________ 
PET CT SCAN TUMOR IMAGING SKULL TO THIGH, 10/24/2022 [DATE]: 
(Films were taken at [facility] on 10/24/2022 and interpreted at 
[facility] on 10/24/2022 . 
CLINICAL INDICATION:  Lung malignancy, bone metastasis
TECHNIQUE: A dose of 15.8 millicuries of 18-FDG was administered intravenously 
and skull to thigh PET scanning was performed at 50 minutes. Tomographic scans 
were reconstructed in axial, coronal, and sagittal projections. The data was 
reconstructed into a three-dimensional volume rendered image and reviewed in a 
rotational cine loop. Serum blood glucose at the time of injection was 96 mg/dl.

[Series 4: ct wb · axial · 4.0mm · 0.98mm/px · 1 of 198 slices shown]
[im 198/198  brain]
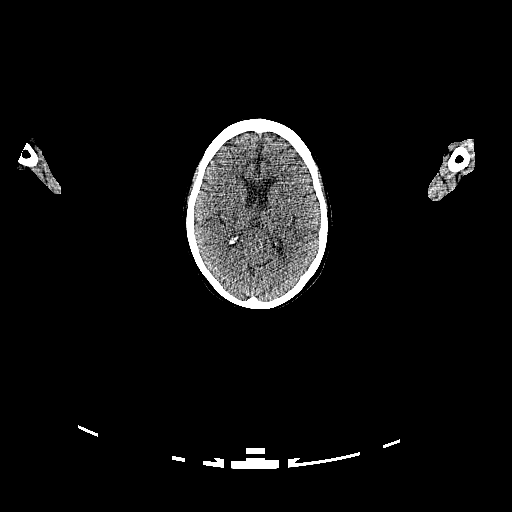

[1 of 25 positions shown; findings below may reference images not displayed]

FINDINGS: Corresponding to CT image 76 there is a 8 mm nodule, adjacent to the 
oblique fissure, appearing to lie in the left lower lobe. This shows FDG uptake, 
maximum SUV 2.5. There is a small left pleural effusion. There is an irregular 
density in the medial left lower lobe, CT image 75 measuring approximately 9 x 4 
mm with maximum SUV 2.3. There is mild pleural activity at the left base, likely 
reactive. There is linear atelectasis at the lung bases. At the left apex, CT 
image 52 there is a 5 mm nodule, showing maximum SUV 0.9. There is no 
mediastinal nodal activity. 
Cervical node bearing regions show no abnormal uptake. 
There is no abnormal hepatic, splenic or adrenal activity. There is physiologic 
urinary excretion of radiopharmaceutical. 
There is activity in the rectum with maximum SUV 6.4. Clinical correlation 
recommended. 
There is activity along the left anteroinferior rib cage corresponding to CT 
image 95. There is mild asymmetric thickening along the insertion of the 
diaphragm at this level, and there is focal calcification seen on image 98. 
Maximum SUV 3.6. This is nonspecific, possibly related to recent trauma. 
Neoplastic involvement cannot be excluded. Prior maximum SUV was 5.7. 
 There is mild activity in the left anterior eighth rib without evidence for 
lytic or blastic lesion or obvious fracture. Maximum SUV 2.5. Mild activity in 
the adjacent anterior left ninth rib raises probability that uptake is related 
to recent trauma. 
Mild activity in the left seventh costovertebral junction, CT image 65, maximum 
SUV 2.7. This likely reflects volume averaging of activity at the left pleura 
versus degenerative change. 
The May 28, 2021 study showed FDG avid mediastinal adenopathy which has 
essentially resolved. The largest focus on the prior study,. In the subcarinal 
region, and showed maximum SUV 35.1. Current activity in this region is 2.4.
IMPRESSION: The previously seen mediastinal FDG avid nodes evident on the outside PET 
examination May 28, 2021 have essentially resolved. 
Activity along the left anteroinferior chest wall shows maximum SUV 5.7, 
currently 3.6. This is indeterminant. There is mild activity along the anterior 
left eighth and ninth ribs, also present previously. No CT evidence for osseous 
lytic or blastic lesion or fracture. This could reflect blunt impaction injury; 
clinical correlation. 
8 mm left lower lobe nodule previously measured 5 mm. Current maximum SUV is 
2.5, previously 1.7. Further surveillance follow-up chest CT recommended. 
5 mm left apical nodule is stable, maximum SUV 0.9, likely benign but this can 
also be reevaluated with follow-up surveillance CT. 
Small left pleural effusion is new. There is mild activity along the medial left 
lung base which could represent atelectasis, but should also be reevaluated with 
surveillance CT. 
Recent bone scan described "subtle L4 uptake." No abnormal uptake on todays 
examination. CT images show no blastic or lytic lesion. Enhanced fat suppressed 
MRI of the lumbar spine would be useful for further evaluation of clinically 
indicated. 
There are arteriosclerotic and degenerative changes. Mild diverticulosis. Right 
chest port.

## 2022-12-18 IMAGING — NM BONE SCAN WHOLE BODY
1 series · 2 of 2 positions shown · non-contrast
Comparison: 10/24/2022 PET/CT and 10/04/2022 bone scan

________________________________________________________________________________________________ 
BONE SCAN WHOLE BODY, 12/18/2022 [DATE]: 
CLINICAL INDICATION: Malignant neoplasm developing sites of unspecified 
bronchus. Patient is on Keytruda. Status post chemotherapy and radiation 
therapy.
TECHNIQUE: Following the injection of  25.5 mCi SHUSEI Tc 99m MDP scanning of the 
entire skeleton was performed.

[Series 1000: wholebody · 2.40mm/px · 2 of 2 frames shown]
[frame 1/2]
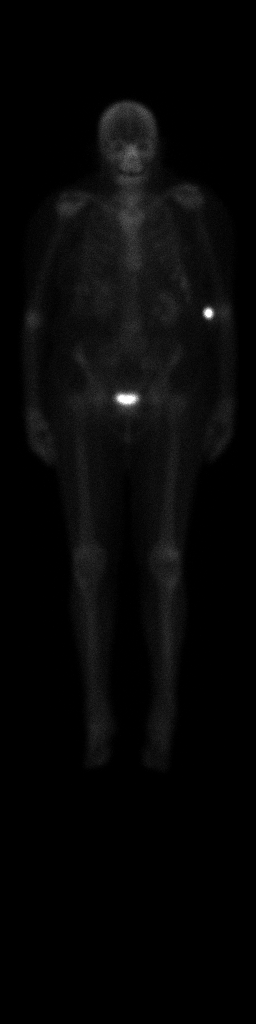
[frame 2/2]
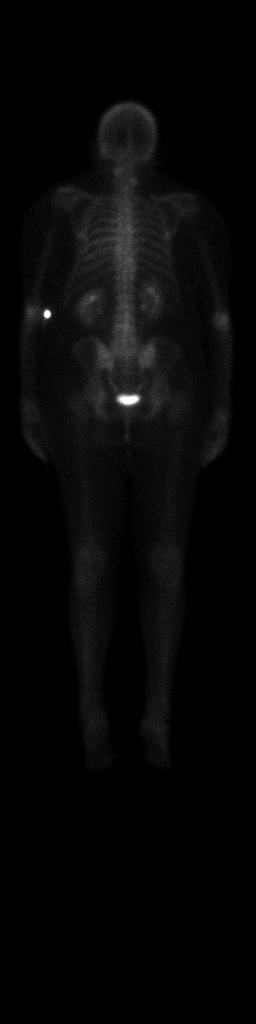

[2 of 2 positions shown; findings below may reference images not displayed]

FINDINGS: Physiologic uptake of radiotracer. Stable mild increased radiotracer 
uptake in the right posteromedial ilium, corresponding to healed osseous 
metastasis demonstrated on CT. No new lesions. Mild increased radiotracer uptake 
in the right mid cervical spine and lumbar spine, corresponding to degenerative 
change on PET/CT. Mildly increased radiotracer uptake in the left knee, 
consistent with degenerative change.
IMPRESSION: Stable mild increased radiotracer uptake in the right posteromedial ilium, 
corresponding to healed osseous metastasis demonstrated on CT. No new lesions.
